# Patient Record
Sex: Female | Born: 1935 | Race: White | Hispanic: No | Smoking: Never smoker
Health system: Southern US, Community
[De-identification: ages and names within clinical notes are randomized; demographics above are authoritative.]

## PROBLEM LIST (undated history)

## (undated) ENCOUNTER — Ambulatory Visit: Admission: EM | Source: Home / Self Care

## (undated) DIAGNOSIS — E7800 Pure hypercholesterolemia, unspecified: Secondary | ICD-10-CM

## (undated) DIAGNOSIS — K219 Gastro-esophageal reflux disease without esophagitis: Secondary | ICD-10-CM

## (undated) DIAGNOSIS — Z9889 Other specified postprocedural states: Secondary | ICD-10-CM

## (undated) DIAGNOSIS — T8859XA Other complications of anesthesia, initial encounter: Secondary | ICD-10-CM

## (undated) DIAGNOSIS — R112 Nausea with vomiting, unspecified: Secondary | ICD-10-CM

## (undated) DIAGNOSIS — M1990 Unspecified osteoarthritis, unspecified site: Secondary | ICD-10-CM

## (undated) HISTORY — PX: TUBAL LIGATION: SHX77

## (undated) HISTORY — PX: CHOLECYSTECTOMY: SHX55

## (undated) HISTORY — PX: OTHER SURGICAL HISTORY: SHX169

## (undated) HISTORY — PX: CATARACT EXTRACTION W/ INTRAOCULAR LENS IMPLANT: SHX1309

---

## 2016-05-07 DIAGNOSIS — E559 Vitamin D deficiency, unspecified: Secondary | ICD-10-CM | POA: Insufficient documentation

## 2016-05-07 DIAGNOSIS — E782 Mixed hyperlipidemia: Secondary | ICD-10-CM | POA: Insufficient documentation

## 2019-04-13 ENCOUNTER — Other Ambulatory Visit: Payer: Self-pay

## 2019-04-13 ENCOUNTER — Ambulatory Visit (INDEPENDENT_AMBULATORY_CARE_PROVIDER_SITE_OTHER): Payer: Medicare Other

## 2019-04-13 ENCOUNTER — Ambulatory Visit (INDEPENDENT_AMBULATORY_CARE_PROVIDER_SITE_OTHER): Payer: Medicare Other | Admitting: Orthopaedic Surgery

## 2019-04-13 ENCOUNTER — Encounter: Payer: Self-pay | Admitting: Orthopaedic Surgery

## 2019-04-13 VITALS — BP 131/66 | HR 78 | Ht 66.0 in | Wt 165.0 lb

## 2019-04-13 DIAGNOSIS — M4807 Spinal stenosis, lumbosacral region: Secondary | ICD-10-CM

## 2019-04-13 DIAGNOSIS — M545 Low back pain, unspecified: Secondary | ICD-10-CM

## 2019-04-13 DIAGNOSIS — M25562 Pain in left knee: Secondary | ICD-10-CM

## 2019-04-13 DIAGNOSIS — G8929 Other chronic pain: Secondary | ICD-10-CM

## 2019-04-13 DIAGNOSIS — M79605 Pain in left leg: Secondary | ICD-10-CM | POA: Insufficient documentation

## 2019-04-13 NOTE — Progress Notes (Signed)
Office Visit Note   Patient: Shelby Chan           Date of Birth: 05/27/1933           MRN: [id number] Visit Date: 04/13/2019              Requested by: No referring provider defined for this encounter. PCP: Donalynn Furlong, MD   Assessment & Plan: Visit Diagnoses:  1. Chronic midline low back pain, unspecified whether sciatica present   2. Chronic pain of left knee   3. Spinal stenosis of lumbosacral region   4. Pain in left leg     Plan: Difficult historian but it appears that the left leg pain may originate from the lumbar spine.  Will obtain MRI scan looking for stenosis.  Having considerable compromise of her activities during the day because of her leg pain that seems somewhat diffuse rather than localized.  We will also complete a form for handicap parking sticker.  Office visit 45 minutes 50% of the time in counseling  Follow-Up Instructions: Return After MRI scan of lumbar spine.   Orders:  Orders Placed This Encounter  Procedures   XR Lumbar Spine 2-3 Views   XR Knee 1-2 Views Left   MR Lumbar Spine w/o contrast   No orders of the defined types were placed in this encounter.     Procedures: No procedures performed   Clinical Data: No additional findings.   Subjective: Chief Complaint  Patient presents with   Right Knee - Pain   Left Knee - Pain   Left Hip - Pain  Patient presents today for bilateral knee pain and left hip pain. No known injury. Patient states that her left side is worse than the right. She has been having pain for 6 months. She states that she has a difficult time getting up or down. She is wanting a handicap application. She does not take anything for pain.  Shelby Chan is accompanied by her daughter and here for evaluation of left leg pain.  She is a difficult historian and with difficulty being specific but she has had trouble for over 6 months.  No history of injury or trauma.  She has had pain predominantly in her  left hip, thigh and left knee.  She does have difficulty with her legs feeling tired and heavy the further she walks to the longer she is on her feet.  She has had considerable trouble sleeping at night trying to find a comfortable position because of her leg pain.  She is tried some over-the-counter medicines but not sure that it is made much of a difference.  Having very minimal discomfort on the right side.  Numbness and tingling is not part of her symptomatology. Over 15 years ago was evaluated at [facility] for low back pain.  Apparently had an MRI scan and possibly back injections.  Has done well over the years without further intervention HPI  Review of Systems   Objective: Vital Signs: BP 131/66    Pulse 78    Ht 5\' 6"  (1.676 m)    Wt 165 lb (74.8 kg)    BMI 26.63 kg/m   Physical Exam Constitutional:      Appearance: She is well-developed.  Eyes:     Pupils: Pupils are equal, round, and reactive to light.  Pulmonary:     Effort: Pulmonary effort is normal.  Skin:    General: Skin is warm and dry.  Neurological:  Mental Status: She is alert and oriented to person, place, and time.  Psychiatric:        Behavior: Behavior normal.     Ortho Exam awake alert and oriented x3.  Comfortable sitting.  Straight leg raise negative bilaterally.  Both feet were warm without swelling.  Minimal venous stasis changes both ankles.  Good pulses.  Motor exam intact.  No left knee effusion.  Mild medial joint tenderness diffusely.  Full extension flexed over 105 degrees without instability.  No localized thigh pain.  No loss of left hip motion with internal and external rotation flexion or extension.  No localized areas of tenderness.  No percussible tenderness of the lumbar spine  Specialty Comments:  No specialty comments available.  Imaging: Xr Knee 1-2 Views Left  Result Date: 04/13/2019 AP lateral standing views of the left knee were obtained.  There is some degenerative change in the medial  compartment with narrowing of the joint space and about 2 to 3 degrees of varus.  No acute changes or ectopic calcification.  Mild degenerative changes about the patellofemoral joint.  Patella tracks in the midline  Xr Lumbar Spine 2-3 Views  Result Date: 04/13/2019 AP lateral lumbar spine were obtained demonstrating slight anterior listhesis of L4 on 5.  There is some degenerative disc disease throughout the lumbar spine with mild narrowing.  No vacuum disc phenomenon.  Facet arthritis predominately at L4-5 and to a lesser extent L5-S1.  Some anterior osteophytes.  Sacroiliac joints intact.  No batwing deformity.  L5 sits low in the pelvis.  Films are consistent with degenerative arthritis without any acute changes    PMFS History: Patient Active Problem List   Diagnosis Date Noted   Pain in left leg 04/13/2019   History reviewed. No pertinent past medical history.  History reviewed. No pertinent family history.  History reviewed. No pertinent surgical history. Social History   Occupational History   Not on file  Tobacco Use   Smoking status: Not on file  Substance and Sexual Activity   Alcohol use: Not on file   Drug use: Not on file   Sexual activity: Not on file

## 2019-04-27 ENCOUNTER — Ambulatory Visit (HOSPITAL_COMMUNITY)
Admission: RE | Admit: 2019-04-27 | Discharge: 2019-04-27 | Disposition: A | Discharge status: Home / Self Care | Payer: Medicare Other | Source: Ambulatory Visit | Attending: Orthopaedic Surgery | Admitting: Orthopaedic Surgery

## 2019-04-27 ENCOUNTER — Other Ambulatory Visit: Payer: Self-pay

## 2019-04-27 DIAGNOSIS — M4807 Spinal stenosis, lumbosacral region: Secondary | ICD-10-CM | POA: Diagnosis not present

## 2019-05-25 ENCOUNTER — Other Ambulatory Visit: Payer: Self-pay | Admitting: Orthopaedic Surgery

## 2019-05-25 DIAGNOSIS — G8929 Other chronic pain: Secondary | ICD-10-CM

## 2019-06-06 ENCOUNTER — Encounter (HOSPITAL_COMMUNITY): Payer: Self-pay

## 2019-06-06 ENCOUNTER — Ambulatory Visit (HOSPITAL_COMMUNITY): Payer: Medicare Other | Attending: Orthopaedic Surgery

## 2019-06-06 ENCOUNTER — Other Ambulatory Visit: Payer: Self-pay

## 2019-06-06 DIAGNOSIS — G8929 Other chronic pain: Secondary | ICD-10-CM | POA: Insufficient documentation

## 2019-06-06 DIAGNOSIS — M6281 Muscle weakness (generalized): Secondary | ICD-10-CM | POA: Insufficient documentation

## 2019-06-06 DIAGNOSIS — M545 Low back pain: Secondary | ICD-10-CM | POA: Diagnosis present

## 2019-06-06 NOTE — Therapy (Signed)
[place], [place], [address] Phone: [phone]   Fax:  [phone]  Physical Therapy Evaluation  Patient Details  Name: Shelby Chan MRN: [id number] Date of Birth: [birth year] Referring Provider (PT): Joni Fears, MD   Encounter Date: 06/06/2019  PT End of Session - 06/06/19 1453    Visit Number  1    Number of Visits  8    Date for PT Re-Evaluation  07/08/19    Authorization Type  Medicare   Secondary: commercial generic   Authorization Time Period  06/06/19 to 07/08/19    Authorization - Visit Number  1    Authorization - Number of Visits  10    PT Start Time  3790    PT Stop Time  1530    PT Time Calculation (min)  54 min    Activity Tolerance  Patient tolerated treatment well    Behavior During Therapy  [facility] for tasks assessed/performed       History reviewed. No pertinent past medical history.  History reviewed. No pertinent surgical history.  There were no vitals filed for this visit.   Subjective Assessment - 06/06/19 1442    Subjective  Pt reports LBP on R side typically, but her L hip, knee and ankle hurt and the RLE does not. Pt reports old fracture to R tibia fixed with pins and no difficulties with currently. Pt reports went to PCP about weird sensation in August "I felt like my bones melted and I couldn't get out of bed", but the weakness went away as she increased daily vitamin intake and walking 3x/week. Pt reports "the L shin bone feels like it goes away" increasing her fear for falling. Pt denies changes in b/b. Pt denies numbness/tingling throughout BLE. Pt denies shooting pains down either leg. Pt denies falls in last year, denies AD use. Pt with increased pain and difficulty with transfers after prolonged sitting. Pt reports being equally limited by back pain and BLE weakness. Pt reports back pain increases most at night after getting up to go to the bathroom, but is able to fall back  asleep so ranks it as "not too bad".    Limitations  Walking;Other (comment)   stairs   How long can you sit comfortably?  no issues    How long can you stand comfortably?  no issues    How long can you walk comfortably?  30 minutes    Diagnostic tests  x-ray    Patient Stated Goals  get my leg strength back    Currently in Pain?  No/denies         [facility] PT Assessment - 06/06/19 0001      Assessment   Medical Diagnosis  LBP    Referring Provider (PT)  Joni Fears, MD    Onset Date/Surgical Date  --   approximately 2 months worsening   Prior Therapy  never for this      Restrictions   Weight Bearing Restrictions  No      Balance Screen   Has the patient fallen in the past 6 months  No    Has the patient had a decrease in activity level because of a fear of falling?   No    Is the patient reluctant to leave their home because of a fear of falling?   No      Cognition   Overall Cognitive Status  Within Functional Limits for tasks assessed  Observation/Other Assessments   Focus on Therapeutic Outcomes (FOTO)   37% limited      Sensation   Light Touch  Appears Intact      Functional Tests   Functional tests  Sit to Stand      Sit to Stand   Comments  5x STS: 16 sec, no UE assist, from chair      Posture/Postural Control   Posture/Postural Control  Postural limitations    Postural Limitations  Rounded Shoulders;Forward head;Increased lumbar lordosis;Decreased thoracic kyphosis      ROM / Strength   AROM / PROM / Strength  Strength;AROM      AROM   AROM Assessment Site  Lumbar    Lumbar Flexion  WNL    Lumbar Extension  WNL, increased pain    Lumbar - Right Side Bend  WNL    Lumbar - Left Side Bend  WNL    Lumbar - Right Rotation  WNL    Lumbar - Left Rotation  WNL      Strength   Strength Assessment Site  Hip;Knee;Ankle    Right/Left Hip  Right;Left    Right Hip Flexion  4+/5    Right Hip Extension  3+/5    Right Hip ABduction  4+/5    Left Hip  Flexion  4+/5    Left Hip Extension  3+/5    Left Hip ABduction  4+/5    Right/Left Knee  Right;Left    Right Knee Flexion  4+/5    Right Knee Extension  5/5    Left Knee Flexion  4+/5    Left Knee Extension  5/5    Right Ankle Dorsiflexion  5/5    Left Ankle Dorsiflexion  5/5      Flexibility   Soft Tissue Assessment /Muscle Length  yes    Hamstrings  --    Quadriceps  Ely's: + bil, increased pain on L    ITB  WNL bil      Palpation   SI assessment   denies pain upon palpation    Palpation comment  unable to recreate pt's pain; denies tenderness throughout lumbar paraspinals bil, piriformis bil      Special Tests    Special Tests  Lumbar    Lumbar Tests  Slump Test      Slump test   Findings  Negative    Comment  bilaterally      Bed Mobility   Bed Mobility  Rolling Right;Rolling Left;Supine to Sit;Sit to Supine   increased pain with rolling and supine<>sit   Rolling Right  --   increased time, increased pain   Rolling Left  --   increased time, increased pain   Supine to Sit  --   increased time, increased pain   Sit to Supine  --   increased time, increased pain     Ambulation/Gait   Gait Pattern  Within Functional Limits    Gait Comments  observed pt ambulate into/out of clinic without deficits noted      Balance   Balance Assessed  Yes      Static Standing Balance   Static Standing - Balance Support  No upper extremity supported    Static Standing Balance -  Activities   Single Leg Stance - Right Leg;Single Leg Stance - Left Leg;Tandam Stance - Right Leg;Tandam Stance - Left Leg    Static Standing - Comment/# of Minutes  SLS: <1 sec bilaterally; Tandem RLE back: 2 sec or <,  Tandem LLE back: 10 sec           Objective measurements completed on examination: See above findings.      PT Education - 06/06/19 1451    Education Details  Assessment findings, POC, FOTO findings, initiated HEP    Person(s) Educated  Patient    Methods  Explanation;Handout     Comprehension  Verbalized understanding       PT Short Term Goals - 06/06/19 1612      PT SHORT TERM GOAL #1   Title  Pt will perform 5x STS in 13 sec or < to indicate improved functional strength and improve ability to move around environment.    Time  2    Period  Weeks    Status  New    Target Date  06/20/19      PT SHORT TERM GOAL #2   Title  Pt will be independent with HEP and perform at least 3x/week along with walking program.    Time  2    Period  Weeks    Status  New        PT Long Term Goals - 06/06/19 1613      PT LONG TERM GOAL #1   Title  Pt will improve BLE strength by  MMT grade to improve gait mechanics, stair negotiation and decrease pain with rolling over in bed.    Time  4    Period  Weeks    Status  New    Target Date  07/04/19      PT LONG TERM GOAL #2   Title  Pt will perform tandem stance for 15 sec bilaterally to reduce risk for falls.    Time  4    Period  Weeks    Status  New      PT LONG TERM GOAL #3   Title  Pt will report 50% decrease in LBP symptoms with functional activities to improve overall QoL.    Time  4    Period  Weeks    Status  New             Plan - 06/06/19 1602    Clinical Impression Statement  Pt is a pleasant 85YO female with chronic LBP. Functionally, pt requires increased time with supine<>sit and rolling on therapy mat reporting increase in LBP with activity. Also, slightly decreased transfer time compared to age related norms of 14.8 sec and pt performed 5x STS in 16 sec. Per MMT, pt with decreased bil hip and hamstring strength. Pt with good lumbar AROM, but slight increase in LBP with lumbar extension. Pt with balance deficits, unable to maintain SLS for >1 sec bilaterally and <15 seconds in tandem stance requiring hand held assist to achieve tandem stance position. Pt would benefit from PT interventions to improve deficits noted and establish HEP to continue progressing independently.    Personal Factors and  Comorbidities  Age    Examination-Activity Limitations  Bed Mobility;Locomotion Level    Examination-Participation Restrictions  Church    Stability/Clinical Decision Making  Stable/Uncomplicated    Clinical Decision Making  Low    Rehab Potential  Good    PT Frequency  2x / week    PT Duration  4 weeks    PT Treatment/Interventions  ADLs/Self Care Home Management;Aquatic Therapy;Biofeedback;Traction;DME Instruction;Gait training;Stair training;Functional mobility training;Therapeutic activities;Therapeutic exercise;Balance training;Neuromuscular re-education;Patient/family education;Orthotic Fit/Training;Manual techniques;Passive range of motion;Dry needling;Taping;Spinal Manipulations;Joint Manipulations    PT Next Visit Plan  Perform DGI.  Review TA set, avoiding breath holding; progress core strengthening in hooklying and eventually progress to standing. Strengthen BLE. Progress to balance training.    PT Home Exercise Plan  Eval: bridge, TA set    Consulted and Agree with Plan of Care  Patient       Patient will benefit from skilled therapeutic intervention in order to improve the following deficits and impairments:  Decreased activity tolerance, Decreased balance, Decreased strength, Difficulty walking, Impaired perceived functional ability, Impaired flexibility, Impaired sensation, Pain  Visit Diagnosis: Chronic bilateral low back pain without sciatica  Muscle weakness (generalized)     Problem List Patient Active Problem List   Diagnosis Date Noted  . Pain in left leg 04/13/2019     Domenick Bookbinder PT, DPT 06/06/19, 4:15 PM [phone]  [facility] [address] [place], [place], [address] Phone: [phone]   Fax:  [phone]  Name: Shelby Chan MRN: [id number] Date of Birth: 25-Sep-1932

## 2019-06-08 ENCOUNTER — Ambulatory Visit (HOSPITAL_COMMUNITY): Payer: Medicare Other | Admitting: Physical Therapy

## 2019-06-08 ENCOUNTER — Other Ambulatory Visit: Payer: Self-pay

## 2019-06-08 DIAGNOSIS — G8929 Other chronic pain: Secondary | ICD-10-CM

## 2019-06-08 DIAGNOSIS — M6281 Muscle weakness (generalized): Secondary | ICD-10-CM

## 2019-06-08 DIAGNOSIS — M545 Low back pain: Secondary | ICD-10-CM | POA: Diagnosis not present

## 2019-06-08 NOTE — Patient Instructions (Signed)
Hamstring Stretch (Sitting)    Sitting, extend one leg and place hands on same thigh for support. Keeping torso straight, lean forward, sliding hands down leg, until a stretch is felt in back of thigh. Hold _30___ seconds. Repeat with other leg. Complete 2-3 times each 1-2X day.   Piriformis Stretch, Sitting    Sit, one ankle on opposite knee, same-side hand on crossed knee. Push down on knee, keeping spine straight. Lean torso forward, with flat back, until tension is felt in hamstrings and gluteals of crossed-leg side. Hold _30__ seconds.  Repeat 2-3 times each leg 1-2X day    Balance: Eyes Open - Unilateral (Varied Surfaces)    Stand on left foot, eyes open. Maintain balance _30___ seconds. Repeat _5___ times per set. Complete __2__times daily   Tandem Stance    Right foot in front of left, heel touching toe both feet "straight ahead". Stand on Foot Triangle of Support with both feet. Balance in this position _30__ seconds. Do with left foot in front of right.   EXTENSION: Standing (Active)    Stand, both feet flat. Draw right leg behind body as far as possible. complete _10__ repetitions. Perform _2__ sessions per day.   Hip Abduction (Standing)    Stand with support. Squeeze pelvic floor and hold. Lift right leg out to side, keeping toe forward. Complete __10__repetitions, __2__times a day   Toe Raise (Standing)    Rock back on heels but don't let your bottom poke out.  Repeat _10_ times per set. Do _2___ sessions per day.    Heel Raises    Stand with support. Tighten pelvic floor and hold. With knees straight, raise heels off ground. Complete __10__reps and complete __2__times a day.

## 2019-06-08 NOTE — Therapy (Addendum)
[place], [place], [address] Phone: [phone]   Fax:  [phone]  Physical Therapy Treatment  Patient Details  Name: Shelby Chan MRN: [id number] Date of Birth: Nov 02, 1932 Referring Provider (PT): Joni Fears, MD   Encounter Date: 06/08/2019  PT End of Session - 06/08/19 1639    Visit Number  2    Number of Visits  8    Date for PT Re-Evaluation  07/08/19    Authorization Type  Medicare   Secondary: commercial generic   Authorization Time Period  06/06/19 to 07/08/19    Authorization - Visit Number  2    Authorization - Number of Visits  10    PT Start Time  1448    PT Stop Time  1550    PT Time Calculation (min)  62 min    Activity Tolerance  Patient tolerated treatment well    Behavior During Therapy  [facility] for tasks assessed/performed       No past medical history on file.  No past surgical history on file.  There were no vitals filed for this visit.                    [facility] PT Treatment/Exercise - 06/08/19 0001      Knee/Hip Exercises: Stretches   Active Hamstring Stretch  Both;2 reps;30 seconds    Active Hamstring Stretch Limitations  longsititng    Piriformis Stretch  Both;2 reps;30 seconds;Limitations    Piriformis Stretch Limitations  seated      Knee/Hip Exercises: Standing   Heel Raises  Both;10 reps    Heel Raises Limitations  toeraises 10 reps    Hip Abduction  Both;10 reps    Hip Extension  Both;10 reps    SLS  5x   both without UE max of 5" each 5 trials   Other Standing Knee Exercises  tandem stance 30" each LE      Knee/Hip Exercises: Seated   Sit to Sand  2 sets;5 reps;without UE support      Knee/Hip Exercises: Supine   Bridges  10 reps    Bridges Limitations  HEP review    Other Supine Knee/Hip Exercises  TA contractions 10X5 with breathing techniuqe HEP rreview             PT Education - 06/08/19 1636    Education Details  reviewed  goals, HEP and POC moving foward.  Updated HEP per patient request with static balance and gentle LE strengthening    Person(s) Educated  Patient    Methods  Explanation;Demonstration;Tactile cues;Verbal cues;Handout    Comprehension  Verbalized understanding;Returned demonstration;Verbal cues required;Tactile cues required;Need further instruction       PT Short Term Goals - 06/08/19 1515      PT SHORT TERM GOAL #1   Title  Pt will perform 5x STS in 13 sec or < to indicate improved functional strength and improve ability to move around environment.    Time  2    Period  Weeks    Status  On-going    Target Date  06/20/19      PT SHORT TERM GOAL #2   Title  Pt will be independent with HEP and perform at least 3x/week along with walking program.    Time  2    Period  Weeks    Status  On-going        PT Long Term Goals - 06/06/19  1613      PT LONG TERM GOAL #1   Title  Pt will improve BLE strength by  MMT grade to improve gait mechanics, stair negotiation and decrease pain with rolling over in bed.    Time  4    Period  Weeks    Status  New    Target Date  07/04/19      PT LONG TERM GOAL #2   Title  Pt will perform tandem stance for 15 sec bilaterally to reduce risk for falls.    Time  4    Period  Weeks    Status  New      PT LONG TERM GOAL #3   Title  Pt will report 50% decrease in LBP symptoms with functional activities to improve overall QoL.    Time  4    Period  Weeks    Status  New            Plan - 06/08/19 1643    Clinical Impression Statement  Reviewed goals, HEP and POC moving foward.  Pt requested more challenging therex today so added static balance (with insturction to hold or complete near counter/wall corner), bilateral LE strengthening and stretching.  Given writen instructions for completion and told to do half in the morning and other half in the evening.  Pt verbalized understanding.  No pain or difficutly with any of the therex, most difficulty     Personal Factors and Comorbidities  Age    Examination-Activity Limitations  Bed Mobility;Locomotion Level    Examination-Participation Restrictions  Church    Stability/Clinical Decision Making  Stable/Uncomplicated    Rehab Potential  Good    PT Frequency  2x / week    PT Duration  4 weeks    PT Treatment/Interventions  ADLs/Self Care Home Management;Aquatic Therapy;Biofeedback;Traction;DME Instruction;Gait training;Stair training;Functional mobility training;Therapeutic activities;Therapeutic exercise;Balance training;Neuromuscular re-education;Patient/family education;Orthotic Fit/Training;Manual techniques;Passive range of motion;Dry needling;Taping;Spinal Manipulations;Joint Manipulations    PT Next Visit Plan  Perform DGI. continue to review TA set to ensure pt is not breath holding.Progress core and Bil LE Strength.    PT Home Exercise Plan  Eval: bridge, TA set   10/6: heelraise, hip abd/ext, static SLS/tandem, sit to stands, Hamstring and piriformis stretches in seated/longseated    Consulted and Agree with Plan of Care  Patient       Patient will benefit from skilled therapeutic intervention in order to improve the following deficits and impairments:  Decreased activity tolerance, Decreased balance, Decreased strength, Difficulty walking, Impaired perceived functional ability, Impaired flexibility, Impaired sensation, Pain  Visit Diagnosis: Chronic bilateral low back pain without sciatica  Muscle weakness (generalized)     Problem List Patient Active Problem List   Diagnosis Date Noted  . Pain in left leg 04/13/2019   Lurena Nida, PTA/CLT [phone]  Lurena Nida 06/08/2019, 5:04 PM  [facility] [address] [place], [place], [address] Phone: [phone]   Fax:  [phone]  Name: Shelby Chan MRN: [id number] Date of Birth: June 08, 1933

## 2019-06-13 ENCOUNTER — Other Ambulatory Visit: Payer: Self-pay

## 2019-06-13 ENCOUNTER — Encounter (HOSPITAL_COMMUNITY): Payer: PRIVATE HEALTH INSURANCE | Admitting: Physical Therapy

## 2019-06-13 ENCOUNTER — Ambulatory Visit (HOSPITAL_COMMUNITY): Payer: Medicare Other | Admitting: Physical Therapy

## 2019-06-13 DIAGNOSIS — M545 Low back pain, unspecified: Secondary | ICD-10-CM

## 2019-06-13 DIAGNOSIS — G8929 Other chronic pain: Secondary | ICD-10-CM

## 2019-06-13 DIAGNOSIS — M6281 Muscle weakness (generalized): Secondary | ICD-10-CM

## 2019-06-13 NOTE — Therapy (Signed)
[place], [place], [address] Phone: [phone]   Fax:  [phone]  Physical Therapy Treatment  Patient Details  Name: Shelby Chan MRN: [id number] Date of Birth: [birth year] Referring Provider (PT): Joni Fears, MD   Encounter Date: 06/13/2019  PT End of Session - 06/13/19 1707    Visit Number  3    Number of Visits  8    Date for PT Re-Evaluation  07/08/19    Authorization Type  Medicare   Secondary: commercial generic   Authorization Time Period  06/06/19 to 07/08/19    Authorization - Visit Number  3    Authorization - Number of Visits  10    PT Start Time  1616    PT Stop Time  1700    PT Time Calculation (min)  44 min    Activity Tolerance  Patient tolerated treatment well    Behavior During Therapy  [facility] for tasks assessed/performed       No past medical history on file.  No past surgical history on file.  There were no vitals filed for this visit.  Subjective Assessment - 06/13/19 1616    Subjective  pt states the exercises are going well at home.  Having no issues today but her RT back/buttock gave her a fit last night and hurt this morning too.    Currently in Pain?  No/denies         [facility] PT Assessment - 06/13/19 0001      Assessment   Medical Diagnosis  LBP    Referring Provider (PT)  Joni Fears, MD      Dynamic Gait Index   Level Surface  Normal    Change in Gait Speed  Mild Impairment    Gait with Horizontal Head Turns  Mild Impairment    Gait with Vertical Head Turns  Mild Impairment    Gait and Pivot Turn  Mild Impairment    Step Over Obstacle  Mild Impairment    Step Around Obstacles  Mild Impairment    Steps  Mild Impairment    Total Score  17                   [facility] Adult PT Treatment/Exercise - 06/13/19 0001      Knee/Hip Exercises: Stretches   Active Hamstring Stretch  Both;2 reps;30 seconds    Active Hamstring Stretch Limitations   longsititng    Piriformis Stretch  Both;2 reps;30 seconds;Limitations    Piriformis Stretch Limitations  seated      Knee/Hip Exercises: Standing   Heel Raises  Both;10 reps    Heel Raises Limitations  toeraises 10 reps    Hip Abduction  Both;10 reps    Hip Extension  Both;10 reps    SLS  5x    Other Standing Knee Exercises  tandem stance 30" each LE      Knee/Hip Exercises: Seated   Sit to Sand  10 reps      Knee/Hip Exercises: Supine   Bridges  10 reps    Straight Leg Raises  Both;10 reps      Knee/Hip Exercises: Sidelying   Hip ABduction  Both;10 reps      Knee/Hip Exercises: Prone   Hamstring Curl  10 reps               PT Short Term Goals - 06/08/19 1515      PT SHORT TERM GOAL #1  Title  Pt will perform 5x STS in 13 sec or < to indicate improved functional strength and improve ability to move around environment.    Time  2    Period  Weeks    Status  On-going    Target Date  06/20/19      PT SHORT TERM GOAL #2   Title  Pt will be independent with HEP and perform at least 3x/week along with walking program.    Time  2    Period  Weeks    Status  On-going        PT Long Term Goals - 06/06/19 1613      PT LONG TERM GOAL #1   Title  Pt will improve BLE strength by  MMT grade to improve gait mechanics, stair negotiation and decrease pain with rolling over in bed.    Time  4    Period  Weeks    Status  New    Target Date  07/04/19      PT LONG TERM GOAL #2   Title  Pt will perform tandem stance for 15 sec bilaterally to reduce risk for falls.    Time  4    Period  Weeks    Status  New      PT LONG TERM GOAL #3   Title  Pt will report 50% decrease in LBP symptoms with functional activities to improve overall QoL.    Time  4    Period  Weeks    Status  New            Plan - 06/13/19 1645    Clinical Impression Statement  pt with poor recall of therex requiring verbal, tactile cues and demonstration by therapist. DGI completed with  score of 17 today. Added additonal LE strengthening exercises in supine, sidelying and prone without issues and good form.  No new exercises given this session as patient has not mastered form of ones given.    Personal Factors and Comorbidities  Age    Examination-Activity Limitations  Bed Mobility;Locomotion Level    Examination-Participation Restrictions  Church    Stability/Clinical Decision Making  Stable/Uncomplicated    Rehab Potential  Good    PT Frequency  2x / week    PT Duration  4 weeks    PT Treatment/Interventions  ADLs/Self Care Home Management;Aquatic Therapy;Biofeedback;Traction;DME Instruction;Gait training;Stair training;Functional mobility training;Therapeutic activities;Therapeutic exercise;Balance training;Neuromuscular re-education;Patient/family education;Orthotic Fit/Training;Manual techniques;Passive range of motion;Dry needling;Taping;Spinal Manipulations;Joint Manipulations    PT Next Visit Plan  Continue with Progression of core and Bil LE Strength.    PT Home Exercise Plan  Eval: bridge, TA set   10/6: heelraise, hip abd/ext, static SLS/tandem, sit to stands, Hamstring and piriformis stretches in seated/longseated    Consulted and Agree with Plan of Care  Patient       Patient will benefit from skilled therapeutic intervention in order to improve the following deficits and impairments:  Decreased activity tolerance, Decreased balance, Decreased strength, Difficulty walking, Impaired perceived functional ability, Impaired flexibility, Impaired sensation, Pain  Visit Diagnosis: Chronic bilateral low back pain without sciatica  Muscle weakness (generalized)     Problem List Patient Active Problem List   Diagnosis Date Noted  . Pain in left leg 04/13/2019   Lurena Nida, PTA/CLT [phone]  Lurena Nida 06/13/2019, 5:08 PM  [facility] [address] [place], [place], [address] Phone: [phone]   Fax:   [phone]  Name:  Shelby Chan MRN: [id number] Date of Birth: 03/03/1933

## 2019-06-15 ENCOUNTER — Ambulatory Visit (HOSPITAL_COMMUNITY): Payer: Medicare Other | Admitting: Physical Therapy

## 2019-06-15 ENCOUNTER — Other Ambulatory Visit: Payer: Self-pay

## 2019-06-15 DIAGNOSIS — M545 Low back pain, unspecified: Secondary | ICD-10-CM

## 2019-06-15 DIAGNOSIS — M6281 Muscle weakness (generalized): Secondary | ICD-10-CM

## 2019-06-15 DIAGNOSIS — G8929 Other chronic pain: Secondary | ICD-10-CM

## 2019-06-15 NOTE — Therapy (Signed)
[place], [place], [address] Phone: [phone]   Fax:  [phone]  Physical Therapy Treatment  Patient Details  Name: Shelby Chan MRN: [id number] Date of Birth: Nov 30, 1932 Referring Provider (PT): Joni Fears, MD   Encounter Date: 06/15/2019  PT End of Session - 06/15/19 1733    Visit Number  4    Number of Visits  8    Date for PT Re-Evaluation  07/08/19    Authorization Type  Medicare   Secondary: commercial generic   Authorization Time Period  06/06/19 to 07/08/19    Authorization - Visit Number  4    Authorization - Number of Visits  10    PT Start Time  1620    PT Stop Time  1706    PT Time Calculation (min)  46 min    Activity Tolerance  Patient tolerated treatment well    Behavior During Therapy  [facility] for tasks assessed/performed       No past medical history on file.  No past surgical history on file.  There were no vitals filed for this visit.  Subjective Assessment - 06/15/19 1624    Subjective  pt states she is doing well today, reports she is having some pain behind her Rt knee today 1/10 with unknown cause.  No pain anywhere else.    Currently in Pain?  No/denies                       [facility] Adult PT Treatment/Exercise - 06/15/19 0001      Knee/Hip Exercises: Stretches   Active Hamstring Stretch  Both;2 reps;30 seconds    Active Hamstring Stretch Limitations  longsititng    Piriformis Stretch  Both;2 reps;30 seconds;Limitations    Piriformis Stretch Limitations  seated      Knee/Hip Exercises: Standing   Heel Raises  15 reps    Heel Raises Limitations  toeraises 15 reps    Hip Abduction  Both;15 reps    Stairs  5 RT 6" step no UE assist    SLS  5x   max of 5 Rt: 15", Lt: 8"   Other Standing Knee Exercises  tandem stance 10 reps UE flexion with 2# bar      Knee/Hip Exercises: Seated   Sit to Sand  10 reps      Knee/Hip Exercises: Supine   Bridges  10  reps      Knee/Hip Exercises: Sidelying   Hip ABduction  Both;10 reps      Knee/Hip Exercises: Prone   Hamstring Curl  10 reps               PT Short Term Goals - 06/08/19 1515      PT SHORT TERM GOAL #1   Title  Pt will perform 5x STS in 13 sec or < to indicate improved functional strength and improve ability to move around environment.    Time  2    Period  Weeks    Status  On-going    Target Date  06/20/19      PT SHORT TERM GOAL #2   Title  Pt will be independent with HEP and perform at least 3x/week along with walking program.    Time  2    Period  Weeks    Status  On-going        PT Long Term Goals - 06/06/19 1749  PT LONG TERM GOAL #1   Title  Pt will improve BLE strength by  MMT grade to improve gait mechanics, stair negotiation and decrease pain with rolling over in bed.    Time  4    Period  Weeks    Status  New    Target Date  07/04/19      PT LONG TERM GOAL #2   Title  Pt will perform tandem stance for 15 sec bilaterally to reduce risk for falls.    Time  4    Period  Weeks    Status  New      PT LONG TERM GOAL #3   Title  Pt will report 50% decrease in LBP symptoms with functional activities to improve overall QoL.    Time  4    Period  Weeks    Status  New            Plan - 06/15/19 1733    Clinical Impression Statement  Continued review of HEP with corrections for form and cues for recall.  Added vectors today wot continue to work on LE stability.  pt with increased hold times today with SLS.  Also added steps as pateint has approx 15 steps to negotiate to basement for laundry. Pt able to complete 6" steps reciprocally without use of HR, however with some instability noted.  Pt albe to remain balanced without assist needed from therapist.  No new exercises added to HEP today.    Personal Factors and Comorbidities  Age    Examination-Activity Limitations  Bed Mobility;Locomotion Level    Examination-Participation Restrictions   Church    Stability/Clinical Decision Making  Stable/Uncomplicated    Rehab Potential  Good    PT Frequency  2x / week    PT Duration  4 weeks    PT Treatment/Interventions  ADLs/Self Care Home Management;Aquatic Therapy;Biofeedback;Traction;DME Instruction;Gait training;Stair training;Functional mobility training;Therapeutic activities;Therapeutic exercise;Balance training;Neuromuscular re-education;Patient/family education;Orthotic Fit/Training;Manual techniques;Passive range of motion;Dry needling;Taping;Spinal Manipulations;Joint Manipulations    PT Next Visit Plan  Continue with Progression of core and Bil LE Strength.    PT Home Exercise Plan  Eval: bridge, TA set   10/6: heelraise, hip abd/ext, static SLS/tandem, sit to stands, Hamstring and piriformis stretches in seated/longseated    Consulted and Agree with Plan of Care  Patient       Patient will benefit from skilled therapeutic intervention in order to improve the following deficits and impairments:  Decreased activity tolerance, Decreased balance, Decreased strength, Difficulty walking, Impaired perceived functional ability, Impaired flexibility, Impaired sensation, Pain  Visit Diagnosis: Chronic bilateral low back pain without sciatica  Muscle weakness (generalized)     Problem List Patient Active Problem List   Diagnosis Date Noted  . Pain in left leg 04/13/2019   Shelby Chan, PTA/CLT [phone] Shelby Chan 06/15/2019, 5:38 PM  [facility] [address] [place], [place], [address] Phone: [phone]   Fax:  [phone]  Name: Shelby Chan MRN: [id number] Date of Birth: 1933/04/14

## 2019-06-20 ENCOUNTER — Ambulatory Visit (HOSPITAL_COMMUNITY): Payer: Medicare Other | Admitting: Physical Therapy

## 2019-06-20 ENCOUNTER — Telehealth (HOSPITAL_COMMUNITY): Payer: Self-pay | Admitting: Physical Therapy

## 2019-06-20 NOTE — Telephone Encounter (Signed)
provider out of the office due to an emergency. pt already had an appt in [place] this am and could not come in any earlier. S/w the pt's dtr suzanne.

## 2019-06-21 ENCOUNTER — Encounter (HOSPITAL_COMMUNITY): Payer: Self-pay

## 2019-06-21 ENCOUNTER — Other Ambulatory Visit: Payer: Self-pay

## 2019-06-21 ENCOUNTER — Ambulatory Visit (HOSPITAL_COMMUNITY): Payer: Medicare Other

## 2019-06-21 DIAGNOSIS — G8929 Other chronic pain: Secondary | ICD-10-CM

## 2019-06-21 DIAGNOSIS — M545 Low back pain: Secondary | ICD-10-CM

## 2019-06-21 DIAGNOSIS — M6281 Muscle weakness (generalized): Secondary | ICD-10-CM

## 2019-06-21 NOTE — Therapy (Signed)
[facility] [address] [place], [place], [address] Phone: [phone]   Fax:  [phone]  Physical Therapy Treatment  Patient Details  Name: Shelby Chan MRN: [id number] Date of Birth: 10/09/1932 Referring Provider (PT): Norlene CampbellPeter Whitfield, MD   Encounter Date: 06/21/2019  PT End of Session - 06/21/19 1202    Visit Number  5    Number of Visits  8    Date for PT Re-Evaluation  07/08/19    Authorization Type  Medicare; Secondary: commercial generic    Authorization Time Period  06/06/19 to 07/08/19    Authorization - Visit Number  5    Authorization - Number of Visits  10    PT Start Time  1136    PT Stop Time  1218    PT Time Calculation (min)  42 min    Activity Tolerance  Patient tolerated treatment well    Behavior During Therapy  [facility] for tasks assessed/performed       History reviewed. No pertinent past medical history.  History reviewed. No pertinent surgical history.  There were no vitals filed for this visit.  Subjective Assessment - 06/21/19 1140    Subjective  Pt stated she is doing well today, no reports of LBP, does c/o Lt knee pain on anterior/medial aspect 5/10 while walking and sitting.  Reports she was able to sleep good last night, no reports of pain or waking due to pain.    Currently in Pain?  No/denies    Multiple Pain Sites  Yes    Pain Score  5    Pain Location  Knee    Pain Orientation  Left    Pain Descriptors / Indicators  --   it just hurts   Pain Type  Chronic pain    Pain Onset  More than a month ago    Pain Frequency  Intermittent    Aggravating Factors   sitting and walking, fatigue    Pain Relieving Factors  time    Effect of Pain on Daily Activities  Makes it through with minimal effects on daily activities.                       [facility] Adult PT Treatment/Exercise - 06/21/19 0001      Knee/Hip Exercises: Standing   Heel Raises  15 reps    Heel Raises Limitations   toeraises 15 reps incline slope    Hip Abduction  Both;15 reps    Functional Squat  10 reps    Functional Squat Limitations  front of chair, cueing for mechanics    Stairs  5 RT 7" step no UE assist    SLS  5x   Lt 5", Rt 4" max   SLS with Vectors  3x 5" with Bil HHA    Other Standing Knee Exercises  tandem stance 10 reps UE flexion with 2# bar    Other Standing Knee Exercises  sidestep RTB 2RT      Knee/Hip Exercises: Seated   Sit to Sand  10 reps      Knee/Hip Exercises: Prone   Hamstring Curl  10 reps    Hip Extension  10 reps               PT Short Term Goals - 06/08/19 1515      PT SHORT TERM GOAL #1   Title  Pt will perform 5x STS in 13 sec or < to  indicate improved functional strength and improve ability to move around environment.    Time  2    Period  Weeks    Status  On-going    Target Date  06/20/19      PT SHORT TERM GOAL #2   Title  Pt will be independent with HEP and perform at least 3x/week along with walking program.    Time  2    Period  Weeks    Status  On-going        PT Long Term Goals - 06/06/19 1613      PT LONG TERM GOAL #1   Title  Pt will improve BLE strength by  MMT grade to improve gait mechanics, stair negotiation and decrease pain with rolling over in bed.    Time  4    Period  Weeks    Status  New    Target Date  07/04/19      PT LONG TERM GOAL #2   Title  Pt will perform tandem stance for 15 sec bilaterally to reduce risk for falls.    Time  4    Period  Weeks    Status  New      PT LONG TERM GOAL #3   Title  Pt will report 50% decrease in LBP symptoms with functional activities to improve overall QoL.    Time  4    Period  Weeks    Status  New            Plan - 06/21/19 1259    Clinical Impression Statement  Pt able to recall HEP form and reports compliance with HEP.  Progressed to standing exercises this session with focus on LE strengthening and balance training.  Pt able to complete reciprocal pattern on  7in stairs without handrails, noted some instability descending and encouraged to use handrails for safety at home to reduce risk of fall.  Added vector stance, sidestep with theraband resistance and squats for gluteal strengthening to assist with functional activiites and balance.    Personal Factors and Comorbidities  Age    Examination-Activity Limitations  Bed Mobility;Locomotion Level    Examination-Participation Restrictions  Church    Stability/Clinical Decision Making  Stable/Uncomplicated    Clinical Decision Making  Low    Rehab Potential  Good    PT Frequency  2x / week    PT Duration  4 weeks    PT Treatment/Interventions  ADLs/Self Care Home Management;Aquatic Therapy;Biofeedback;Traction;DME Instruction;Gait training;Stair training;Functional mobility training;Therapeutic activities;Therapeutic exercise;Balance training;Neuromuscular re-education;Patient/family education;Orthotic Fit/Training;Manual techniques;Passive range of motion;Dry needling;Taping;Spinal Manipulations;Joint Manipulations    PT Next Visit Plan  Continue with Progression of core and Bil LE Strength.    PT Home Exercise Plan  Eval: bridge, TA set   10/6: heelraise, hip abd/ext, static SLS/tandem, sit to stands, Hamstring and piriformis stretches in seated/longseated.  10/20: prone hip extension       Patient will benefit from skilled therapeutic intervention in order to improve the following deficits and impairments:  Decreased activity tolerance, Decreased balance, Decreased strength, Difficulty walking, Impaired perceived functional ability, Impaired flexibility, Impaired sensation, Pain  Visit Diagnosis: Muscle weakness (generalized)  Chronic bilateral low back pain without sciatica     Problem List Patient Active Problem List   Diagnosis Date Noted  . Pain in left leg 04/13/2019   Becky Sax, LPTA; CBIS [phone]  Juel Burrow 06/21/2019, 1:08 PM  [facility] [address]  [place], [place], [address] Phone: [phone]   Fax:  [phone]  Name: Shelby Chan MRN: [id number] Date of Birth: [birth year]

## 2019-06-21 NOTE — Patient Instructions (Signed)
Prone Single Leg Raise    Lie on stomach, forehead on hands. Exhale, raising one leg, front of hip on the mat. Inhale, lowering leg. Repeat 10 times, alternating legs. Do 1-2 sessions per day.  http://pm.exer.us/41   Copyright  VHI. All rights reserved.

## 2019-06-23 ENCOUNTER — Ambulatory Visit (HOSPITAL_COMMUNITY): Payer: Medicare Other | Admitting: Physical Therapy

## 2019-06-23 ENCOUNTER — Other Ambulatory Visit: Payer: Self-pay

## 2019-06-23 ENCOUNTER — Encounter (HOSPITAL_COMMUNITY): Payer: Self-pay | Admitting: Physical Therapy

## 2019-06-23 DIAGNOSIS — M545 Low back pain, unspecified: Secondary | ICD-10-CM

## 2019-06-23 DIAGNOSIS — M6281 Muscle weakness (generalized): Secondary | ICD-10-CM

## 2019-06-23 DIAGNOSIS — G8929 Other chronic pain: Secondary | ICD-10-CM

## 2019-06-23 NOTE — Therapy (Signed)
[place], [place], [address] Phone: [phone]   Fax:  [phone]  Physical Therapy Treatment  Patient Details  Name: Marge Vandermeulen Petrucci MRN: [id number] Date of Birth: Aug 25, 1933 Referring Provider (PT): Joni Fears, MD   Encounter Date: 06/23/2019  PT End of Session - 06/23/19 1323    Visit Number  6    Number of Visits  8    Date for PT Re-Evaluation  07/08/19    Authorization Type  Medicare; Secondary: commercial generic    Authorization Time Period  06/06/19 to 07/08/19    Authorization - Visit Number  6    Authorization - Number of Visits  10    PT Start Time  1320    Activity Tolerance  Patient tolerated treatment well    Behavior During Therapy  [facility] for tasks assessed/performed       History reviewed. No pertinent past medical history.  History reviewed. No pertinent surgical history.  There were no vitals filed for this visit.  Subjective Assessment - 06/23/19 1346    Subjective  Staes she can tell she is getting better. Her back doesn't hurt as much her legs get stronger. States she was so happy she was able to go up and down the steps here last time. States she did have some pain in her legs this morning but it eventually went away. States overall she feels about 50% better.    Currently in Pain?  No/denies    Pain Score  0-No pain    Pain Radiating Towards  not radiating in legs today    Pain Onset  More than a month ago         [facility] PT Assessment - 06/23/19 0001      Assessment   Medical Diagnosis  LBP    Referring Provider (PT)  Joni Fears, MD      Observation/Other Assessments   Focus on Therapeutic Outcomes (FOTO)   25% limited   37% limited at eval     Transfers   Five time sit to stand comments   11.6 seconds      Static Standing Balance   Static Standing Balance -  Activities   Tandam Stance - Right Leg;Tandam Stance - Left Leg    Static Standing - Comment/# of  Minutes  R posterior in tandem 30", L posterior in tandem 24"                   [facility] Adult PT Treatment/Exercise - 06/23/19 0001      Knee/Hip Exercises: Standing   Heel Raises  10 reps;Both;5 seconds;1 set    Forward Step Up  4 sets;5 reps;Hand Hold: 0;Step Height: 8"   and step back  B   Other Standing Knee Exercises  tandem stance - bilaterall 3x30" on floor - focus on single point               PT Short Term Goals - 06/23/19 1323      PT SHORT TERM GOAL #1   Title  Pt will perform 5x STS in 13 sec or < to indicate improved functional strength and improve ability to move around environment.    Baseline  06/23/19 11.6 seconds    Time  2    Period  Weeks    Status  Achieved    Target Date  06/20/19      PT SHORT TERM GOAL #2   Title  Pt  will be independent with HEP and perform at least 3x/week along with walking program.    Baseline  06/23/19 does exercises everyday    Time  2    Period  Weeks    Status  Achieved        PT Long Term Goals - 06/23/19 1324      PT LONG TERM GOAL #1   Title  Pt will improve BLE strength by  MMT grade to improve gait mechanics, stair negotiation and decrease pain with rolling over in bed.    Time  4    Period  Weeks    Status  On-going      PT LONG TERM GOAL #2   Title  Pt will perform tandem stance for 15 sec bilaterally to reduce risk for falls.    Baseline  06/23/19 right leg posterior 30 seconds, left leg posterior 24 sec    Time  4    Period  Weeks    Status  Achieved      PT LONG TERM GOAL #3   Title  Pt will report 50% decrease in LBP symptoms with functional activities to improve overall QoL.    Baseline  06/23/19 50% better    Time  4    Period  Weeks    Status  Achieved      PT LONG TERM GOAL #4   Title  Patient will be able to report walking with her neighbors outside with minimal diffifulcty to get back to PLOF.    Time  4    Period  Weeks    Status  New            Plan - 06/23/19 1323     Clinical Impression Statement  Focus of today's session was reviewing goals, updating FOTO (improved in 12% limitation since start of PT) and objective measurements. Patient is making great progress and has met 2/2 short term goals and 2/3 long term goals. She continues to have pain but it is much better then it was. Patient would continue to benefit from skilled physical therapy focusing on functional strength and endurance. Patient would like to get back to walking program with her neighbors. Added new goal to work towards this.    Personal Factors and Comorbidities  Age    Examination-Activity Limitations  Bed Mobility;Locomotion Level    Examination-Participation Restrictions  Church    Stability/Clinical Decision Making  Stable/Uncomplicated    Rehab Potential  Good    PT Frequency  2x / week    PT Duration  4 weeks    PT Treatment/Interventions  ADLs/Self Care Home Management;Aquatic Therapy;Biofeedback;Traction;DME Instruction;Gait training;Stair training;Functional mobility training;Therapeutic activities;Therapeutic exercise;Balance training;Neuromuscular re-education;Patient/family education;Orthotic Fit/Training;Manual techniques;Passive range of motion;Dry needling;Taping;Spinal Manipulations;Joint Manipulations    PT Next Visit Plan  Continue with Progression of core and Bil LE Strength.    PT Home Exercise Plan  Eval: bridge, TA set   10/6: heelraise, hip abd/ext, static SLS/tandem, sit to stands, Hamstring and piriformis stretches in seated/longseated.  10/20: prone hip extension       Patient will benefit from skilled therapeutic intervention in order to improve the following deficits and impairments:  Decreased activity tolerance, Decreased balance, Decreased strength, Difficulty walking, Impaired perceived functional ability, Impaired flexibility, Impaired sensation, Pain  Visit Diagnosis: Muscle weakness (generalized)  Chronic bilateral low back pain without  sciatica     Problem List Patient Active Problem List   Diagnosis Date Noted  . Pain in left leg 04/13/2019  2:01 PM, 06/23/19 Jerene Pitch, DPT Physical Therapy with [facility]  [phone] office  [place], [place], [address] Phone: [phone]   Fax:  [phone]  Name: Tanysha Quant [place] MRN: [id number] Date of Birth: 05-02-1933

## 2019-06-24 ENCOUNTER — Encounter (HOSPITAL_COMMUNITY): Payer: PRIVATE HEALTH INSURANCE | Admitting: Physical Therapy

## 2019-06-27 ENCOUNTER — Ambulatory Visit (HOSPITAL_COMMUNITY): Payer: Medicare Other

## 2019-06-27 ENCOUNTER — Telehealth (HOSPITAL_COMMUNITY): Payer: Self-pay

## 2019-06-27 NOTE — Telephone Encounter (Signed)
pt's dtr wants to cancel both appts due to they are going out of town unexpectingly

## 2019-06-29 ENCOUNTER — Encounter (HOSPITAL_COMMUNITY): Payer: PRIVATE HEALTH INSURANCE

## 2019-07-04 ENCOUNTER — Encounter (HOSPITAL_COMMUNITY): Payer: Self-pay

## 2019-07-04 ENCOUNTER — Ambulatory Visit (HOSPITAL_COMMUNITY): Payer: Medicare Other | Attending: Orthopaedic Surgery

## 2019-07-04 ENCOUNTER — Other Ambulatory Visit: Payer: Self-pay

## 2019-07-04 DIAGNOSIS — M6281 Muscle weakness (generalized): Secondary | ICD-10-CM

## 2019-07-04 DIAGNOSIS — M545 Low back pain: Secondary | ICD-10-CM | POA: Diagnosis not present

## 2019-07-04 DIAGNOSIS — G8929 Other chronic pain: Secondary | ICD-10-CM

## 2019-07-04 NOTE — Therapy (Signed)
[facility] [address] [place], [place], [address] Phone: [phone]   Fax:  [phone]  Physical Therapy Treatment  Patient Details  Name: Shelby Chan MRN: [id number] Date of Birth: 02-Jul-1933 Referring Provider (PT): Norlene Campbell, MD   Encounter Date: 07/04/2019  PT End of Session - 07/04/19 1351    Visit Number  7    Number of Visits  8    Date for PT Re-Evaluation  07/08/19    Authorization Type  Medicare; Secondary: commercial generic    Authorization Time Period  06/06/19 to 07/08/19    Authorization - Visit Number  7    Authorization - Number of Visits  10    PT Start Time  1351    PT Stop Time  1429    PT Time Calculation (min)  38 min    Activity Tolerance  Patient tolerated treatment well    Behavior During Therapy  [facility] for tasks assessed/performed       History reviewed. No pertinent past medical history.  History reviewed. No pertinent surgical history.  There were no vitals filed for this visit.  Subjective Assessment - 07/04/19 1354    Subjective  Pt reports going out of town to sell her mother's house. Pt reports "life is much better" since starting therapy regarding her low back pain.    Limitations  Walking;Other (comment)    How long can you sit comfortably?  no issues    How long can you stand comfortably?  no issues    How long can you walk comfortably?  30 minutes    Diagnostic tests  x-ray    Patient Stated Goals  get my leg strength back    Currently in Pain?  No/denies    Pain Onset  More than a month ago              [facility] Adult PT Treatment/Exercise - 07/04/19 0001      Knee/Hip Exercises: Stretches   Active Hamstring Stretch  Both;2 reps;30 seconds    Active Hamstring Stretch Limitations  12" step    Piriformis Stretch  Both;2 reps;30 seconds    Piriformis Stretch Limitations  seated      Knee/Hip Exercises: Standing   Heel Raises  15 reps    Heel Raises Limitations   incline    Forward Lunges  Both;10 reps    Forward Lunges Limitations  // bars for balance support    Hip Abduction  Both;15 reps    Abduction Limitations  cues for straight knee    Hip Extension  Both;15 reps    Lateral Step Up  Both;10 reps;Step Height: 8"    Forward Step Up  Both;10 reps;Step Height: 8"    Other Standing Knee Exercises  tandem stance, 6x10 sec hold each leg back             PT Education - 07/04/19 1357    Education Details  Exercise technique, udpated HEP, d/c next session    Person(s) Educated  Patient    Methods  Explanation;Demonstration;Handout    Comprehension  Verbalized understanding       PT Short Term Goals - 06/23/19 1323      PT SHORT TERM GOAL #1   Title  Pt will perform 5x STS in 13 sec or < to indicate improved functional strength and improve ability to move around environment.    Baseline  06/23/19 11.6 seconds    Time  2  Period  Weeks    Status  Achieved    Target Date  06/20/19      PT SHORT TERM GOAL #2   Title  Pt will be independent with HEP and perform at least 3x/week along with walking program.    Baseline  06/23/19 does exercises everyday    Time  2    Period  Weeks    Status  Achieved        PT Long Term Goals - 06/23/19 1324      PT LONG TERM GOAL #1   Title  Pt will improve BLE strength by  MMT grade to improve gait mechanics, stair negotiation and decrease pain with rolling over in bed.    Time  4    Period  Weeks    Status  On-going      PT LONG TERM GOAL #2   Title  Pt will perform tandem stance for 15 sec bilaterally to reduce risk for falls.    Baseline  06/23/19 right leg posterior 30 seconds, left leg posterior 24 sec    Time  4    Period  Weeks    Status  Achieved      PT LONG TERM GOAL #3   Title  Pt will report 50% decrease in LBP symptoms with functional activities to improve overall QoL.    Baseline  06/23/19 50% better    Time  4    Period  Weeks    Status  Achieved      PT LONG TERM  GOAL #4   Title  Patient will be able to report walking with her neighbors outside with minimal diffifulcty to get back to PLOF.    Time  4    Period  Weeks    Status  New            Plan - 07/04/19 1430    Clinical Impression Statement  Pt able to increase step height and reps this session without pain. Pt requires intermittent verbal cues for technique and fatigues with reps. Cues for core engagement with standing exercises to prevent lumbar extension and low back pain with exercises. Added forward lunges for balance and strengthening this date, pt requires single UE support on parallel bars for balancing. Updated pt's HEP this date and discussed d/c due to progress made thus far with therapy. Plan to reassess next session and d/c pt with HEP to continue strengthening at home.    Personal Factors and Comorbidities  Age    Examination-Activity Limitations  Bed Mobility;Locomotion Level    Examination-Participation Restrictions  [employer]    Stability/Clinical Decision Making  Stable/Uncomplicated    Rehab Potential  Good    PT Frequency  2x / week    PT Duration  4 weeks    PT Treatment/Interventions  ADLs/Self Care Home Management;Aquatic Therapy;Biofeedback;Traction;DME Instruction;Gait training;Stair training;Functional mobility training;Therapeutic activities;Therapeutic exercise;Balance training;Neuromuscular re-education;Patient/family education;Orthotic Fit/Training;Manual techniques;Passive range of motion;Dry needling;Taping;Spinal Manipulations;Joint Manipulations    PT Next Visit Plan  finalize HEP and discharge    PT Home Exercise Plan  Eval: bridge, TA set   10/6: heelraise, hip abd/ext, static SLS/tandem, sit to stands, Hamstring and piriformis stretches in seated/longseated.  10/20: prone hip extension; 11/2: standing hip abd and ext    Consulted and Agree with Plan of Care  Patient       Patient will benefit from skilled therapeutic intervention in order to improve the  following deficits and impairments:  Decreased activity tolerance, Decreased  balance, Decreased strength, Difficulty walking, Impaired perceived functional ability, Impaired flexibility, Impaired sensation, Pain  Visit Diagnosis: Chronic bilateral low back pain without sciatica  Muscle weakness (generalized)     Problem List Patient Active Problem List   Diagnosis Date Noted  . Pain in left leg 04/13/2019    Domenick Bookbinderori Wilmer Santillo PT, DPT 07/04/19, 2:32 PM [phone]  [facility] [address] [place], [place], [address] Phone: [phone]   Fax:  [phone]  Name: Shelby Chan MRN: [id number] Date of Birth: 06/08/1933

## 2019-07-06 ENCOUNTER — Telehealth (HOSPITAL_COMMUNITY): Payer: Self-pay

## 2019-07-06 ENCOUNTER — Ambulatory Visit (HOSPITAL_COMMUNITY): Payer: Medicare Other

## 2019-07-06 NOTE — Telephone Encounter (Signed)
pt called to cancel today's appt due to car trouble.

## 2021-06-12 ENCOUNTER — Ambulatory Visit: Payer: Self-pay

## 2021-06-12 ENCOUNTER — Ambulatory Visit (INDEPENDENT_AMBULATORY_CARE_PROVIDER_SITE_OTHER): Payer: Medicare Other | Admitting: Orthopaedic Surgery

## 2021-06-12 ENCOUNTER — Other Ambulatory Visit: Payer: Self-pay

## 2021-06-12 ENCOUNTER — Encounter: Payer: Self-pay | Admitting: Orthopaedic Surgery

## 2021-06-12 DIAGNOSIS — M25572 Pain in left ankle and joints of left foot: Secondary | ICD-10-CM | POA: Diagnosis not present

## 2021-06-12 NOTE — Progress Notes (Signed)
Office Visit Note   Patient: Shelby Chan           Date of Birth: August 14, 1933           MRN: [id number] Visit Date: 06/12/2021              Requested by: Dorice Lamas, MD [address] [place],  [place] [address] PCP: Dorice Lamas, MD   Assessment & Plan: Visit Diagnoses:  1. Pain in left ankle and joints of left foot     Plan: [name] has been experiencing left ankle and foot pain for at least a month.  She lives by her self and is quite independent and notes that she has not experienced a specific injury or trauma.  She has most of her discomfort when she first gets up in the morning or when she first gets up from a sitting position.  She has some pain along the lateral aspect of her foot, the midfoot and even the plantar aspect of her foot.  X-rays were nondiagnostic other than some degenerative changes in the midfoot which certainly could explain some of her symptoms.  I think she has had a mild sprain of the anterior talofibular ligament and probably has some element of plantar fasciitis.  I like her to use Voltaren gel as needed and purchase a pullover ankle support at one of the pharmacies.  She is wearing good comfortable shoes with a good arch support.  All questions were answered.  We will plan to see her back as needed  Follow-Up Instructions: Return if symptoms worsen or fail to improve.   Orders:  Orders Placed This Encounter  Procedures   XR Ankle Complete Left   No orders of the defined types were placed in this encounter.     Procedures: No procedures performed   Clinical Data: No additional findings.   Subjective: Chief Complaint  Patient presents with   Left Ankle - Pain  Patient presents today for left ankle pain. She said that it has been hurting for a month. No known injury, but she does a lot of work outside in her yard. She said that her pain is located medially and laterally, with some swelling. She said that it is  stiff upon getting up. She does not take anything for pain.  HPI  Review of Systems   Objective: Vital Signs: There were no vitals taken for this visit.  Physical Exam Constitutional:      Appearance: She is well-developed.  Pulmonary:     Effort: Pulmonary effort is normal.  Skin:    General: Skin is warm and dry.  Neurological:     Mental Status: She is alert and oriented to person, place, and time.  Psychiatric:        Behavior: Behavior normal.    Ortho Exam awake alert and oriented x3.  Comfortable sitting.  Had a little bit of a limp when she first gets up from its seated position with some mild discomfort over the anterior talofibular ligament left ankle.  No instability or opening.  Good pulses.  Neurologically intact and no edema.  No pain along the peroneal tendons or behind the medial malleolus.  Has significant pronation.  Large bunion with hallux valgus but no discomfort in that area.  Some very minimal discomfort over the plantar fascial attachment to the medial os calcis.  Hypertrophic changes in the midfoot consistent with arthritis and minimal discomfort.  No erythema or ecchymosis.  Specialty Comments:  No specialty comments available.  Imaging: XR Ankle Complete Left  Result Date: 06/12/2021 3 views of the left ankle and AP of the left foot were obtained.  There may be some degenerative changes in the midfoot consistent with the patient's symptoms of discomfort on arising in the midfoot.  There is ectopic calcification along the posterior aspect of the os calcis consistent with spurring and possibly insertional Achilles tendinitis.  Patient does have some discomfort in the plantar aspect of her foot consistent with plantar fasciitis but no significant plantar spurring.  Ankle joint was intact.  Considerable degenerative change at the first metatarsal phalangeal joint with a large bunion but that area is asymptomatic    PMFS History: Patient Active Problem List    Diagnosis Date Noted   Pain in left ankle and joints of left foot 06/12/2021   Pain in left leg 04/13/2019   History reviewed. No pertinent past medical history.  History reviewed. No pertinent family history.  History reviewed. No pertinent surgical history. Social History   Occupational History   Not on file  Tobacco Use   Smoking status: Not on file   Smokeless tobacco: Not on file  Substance and Sexual Activity   Alcohol use: Not on file   Drug use: Not on file   Sexual activity: Not on file

## 2021-11-06 ENCOUNTER — Emergency Department (HOSPITAL_COMMUNITY): Payer: Medicare Other

## 2021-11-06 ENCOUNTER — Ambulatory Visit: Payer: Self-pay

## 2021-11-06 ENCOUNTER — Ambulatory Visit (INDEPENDENT_AMBULATORY_CARE_PROVIDER_SITE_OTHER): Payer: Medicare Other | Admitting: Orthopaedic Surgery

## 2021-11-06 ENCOUNTER — Inpatient Hospital Stay (HOSPITAL_COMMUNITY)
Admission: EM | Admit: 2021-11-06 | Discharge: 2021-11-13 | DRG: 481 | Disposition: A | Discharge status: Skilled Nursing Facility | Payer: Medicare Other | Attending: Internal Medicine | Admitting: Internal Medicine

## 2021-11-06 ENCOUNTER — Other Ambulatory Visit: Payer: Self-pay

## 2021-11-06 ENCOUNTER — Encounter: Payer: Self-pay | Admitting: Orthopaedic Surgery

## 2021-11-06 ENCOUNTER — Emergency Department: Payer: Self-pay

## 2021-11-06 ENCOUNTER — Encounter (HOSPITAL_COMMUNITY): Payer: Self-pay | Admitting: Internal Medicine

## 2021-11-06 DIAGNOSIS — S72011A Unspecified intracapsular fracture of right femur, initial encounter for closed fracture: Secondary | ICD-10-CM | POA: Diagnosis present

## 2021-11-06 DIAGNOSIS — Z7982 Long term (current) use of aspirin: Secondary | ICD-10-CM | POA: Diagnosis not present

## 2021-11-06 DIAGNOSIS — S72091A Other fracture of head and neck of right femur, initial encounter for closed fracture: Secondary | ICD-10-CM | POA: Diagnosis not present

## 2021-11-06 DIAGNOSIS — Z20822 Contact with and (suspected) exposure to covid-19: Secondary | ICD-10-CM | POA: Diagnosis present

## 2021-11-06 DIAGNOSIS — S72001A Fracture of unspecified part of neck of right femur, initial encounter for closed fracture: Secondary | ICD-10-CM | POA: Diagnosis not present

## 2021-11-06 DIAGNOSIS — M79604 Pain in right leg: Secondary | ICD-10-CM | POA: Diagnosis not present

## 2021-11-06 DIAGNOSIS — S50311A Abrasion of right elbow, initial encounter: Secondary | ICD-10-CM | POA: Diagnosis present

## 2021-11-06 DIAGNOSIS — M25561 Pain in right knee: Secondary | ICD-10-CM | POA: Diagnosis not present

## 2021-11-06 DIAGNOSIS — Z111 Encounter for screening for respiratory tuberculosis: Secondary | ICD-10-CM

## 2021-11-06 DIAGNOSIS — S72009A Fracture of unspecified part of neck of unspecified femur, initial encounter for closed fracture: Secondary | ICD-10-CM

## 2021-11-06 DIAGNOSIS — D62 Acute posthemorrhagic anemia: Secondary | ICD-10-CM | POA: Diagnosis not present

## 2021-11-06 DIAGNOSIS — Z419 Encounter for procedure for purposes other than remedying health state, unspecified: Secondary | ICD-10-CM

## 2021-11-06 DIAGNOSIS — Z9049 Acquired absence of other specified parts of digestive tract: Secondary | ICD-10-CM | POA: Diagnosis not present

## 2021-11-06 DIAGNOSIS — M25551 Pain in right hip: Secondary | ICD-10-CM | POA: Diagnosis present

## 2021-11-06 DIAGNOSIS — W19XXXA Unspecified fall, initial encounter: Secondary | ICD-10-CM

## 2021-11-06 DIAGNOSIS — W010XXA Fall on same level from slipping, tripping and stumbling without subsequent striking against object, initial encounter: Secondary | ICD-10-CM | POA: Diagnosis present

## 2021-11-06 DIAGNOSIS — Z79899 Other long term (current) drug therapy: Secondary | ICD-10-CM

## 2021-11-06 DIAGNOSIS — Z8249 Family history of ischemic heart disease and other diseases of the circulatory system: Secondary | ICD-10-CM | POA: Diagnosis not present

## 2021-11-06 HISTORY — DX: Unspecified osteoarthritis, unspecified site: M19.90

## 2021-11-06 LAB — CBC WITH DIFFERENTIAL/PLATELET
Abs Immature Granulocytes: 0.02 10*3/uL (ref 0.00–0.07)
Basophils Absolute: 0.1 10*3/uL (ref 0.0–0.1)
Basophils Relative: 1 %
Eosinophils Absolute: 0.1 10*3/uL (ref 0.0–0.5)
Eosinophils Relative: 1 %
HCT: 42.5 % (ref 36.0–46.0)
Hemoglobin: 14.3 g/dL (ref 12.0–15.0)
Immature Granulocytes: 0 %
Lymphocytes Relative: 47 %
Lymphs Abs: 2.7 10*3/uL (ref 0.7–4.0)
MCH: 31.3 pg (ref 26.0–34.0)
MCHC: 33.6 g/dL (ref 30.0–36.0)
MCV: 93 fL (ref 80.0–100.0)
Monocytes Absolute: 0.4 10*3/uL (ref 0.1–1.0)
Monocytes Relative: 7 %
Neutro Abs: 2.5 10*3/uL (ref 1.7–7.7)
Neutrophils Relative %: 44 %
Platelets: 222 10*3/uL (ref 150–400)
RBC: 4.57 MIL/uL (ref 3.87–5.11)
RDW: 12.8 % (ref 11.5–15.5)
WBC: 5.8 10*3/uL (ref 4.0–10.5)
nRBC: 0 % (ref 0.0–0.2)

## 2021-11-06 LAB — BASIC METABOLIC PANEL
Anion gap: 8 (ref 5–15)
BUN: 15 mg/dL (ref 8–23)
CO2: 28 mmol/L (ref 22–32)
Calcium: 9.7 mg/dL (ref 8.9–10.3)
Chloride: 103 mmol/L (ref 98–111)
Creatinine, Ser: 0.75 mg/dL (ref 0.44–1.00)
GFR, Estimated: >60 mL/min (ref >60)
Glucose, Bld: 96 mg/dL (ref 70–99)
Potassium: 3.6 mmol/L (ref 3.5–5.1)
Sodium: 139 mmol/L (ref 135–145)

## 2021-11-06 LAB — I-STAT CHEM 8, ED
BUN: 17 mg/dL (ref 8–23)
Calcium, Ion: 1.25 mmol/L (ref 1.15–1.40)
Chloride: 103 mmol/L (ref 98–111)
Creatinine, Ser: 0.6 mg/dL (ref 0.44–1.00)
Glucose, Bld: 94 mg/dL (ref 70–99)
HCT: 42 % (ref 36.0–46.0)
Hemoglobin: 14.3 g/dL (ref 12.0–15.0)
Potassium: 3.9 mmol/L (ref 3.5–5.1)
Sodium: 142 mmol/L (ref 135–145)
TCO2: 30 mmol/L (ref 22–32)

## 2021-11-06 LAB — RESP PANEL BY RT-PCR (FLU A&B, COVID) ARPGX2
Influenza A by PCR: NEGATIVE
Influenza B by PCR: NEGATIVE
SARS Coronavirus 2 by RT PCR: NEGATIVE

## 2021-11-06 LAB — SURGICAL PCR SCREEN
MRSA, PCR: NEGATIVE
Staphylococcus aureus: NEGATIVE

## 2021-11-06 MED ORDER — LORATADINE 10 MG PO TABS
10.0000 mg | ORAL_TABLET | Freq: Every day | ORAL | Status: DC
  Administered 2021-11-06 – 2021-11-13 (×8): 10 mg via ORAL
  Filled 2021-11-06 (×8): qty 1

## 2021-11-06 MED ORDER — ENOXAPARIN SODIUM 40 MG/0.4ML IJ SOSY
40.0000 mg | PREFILLED_SYRINGE | INTRAMUSCULAR | Status: DC
  Administered 2021-11-06: 40 mg via SUBCUTANEOUS
  Filled 2021-11-06: qty 0.4

## 2021-11-06 MED ORDER — ASPIRIN EC 81 MG PO TBEC
81.0000 mg | DELAYED_RELEASE_TABLET | Freq: Every day | ORAL | Status: DC
  Administered 2021-11-06: 81 mg via ORAL
  Filled 2021-11-06: qty 1

## 2021-11-06 MED ORDER — HYDROCODONE-ACETAMINOPHEN 5-325 MG PO TABS: 1.0000 | ORAL_TABLET | Freq: Four times a day (QID) | ORAL | Status: DC | PRN

## 2021-11-06 MED ORDER — MORPHINE SULFATE (PF) 2 MG/ML IV SOLN: 0.5000 mg | INTRAVENOUS | Status: DC | PRN

## 2021-11-06 MED ORDER — SENNA 8.6 MG PO TABS
1.0000 | ORAL_TABLET | Freq: Two times a day (BID) | ORAL | Status: DC
  Administered 2021-11-07 – 2021-11-13 (×7): 8.6 mg via ORAL
  Filled 2021-11-06 (×11): qty 1

## 2021-11-06 NOTE — ED Provider Triage Note (Signed)
Emergency Medicine Provider Triage Evaluation Note ? ?Shelby Chan , a 88 y.o. female  was evaluated in triage.  Pt complains of right hip pain.  Patient had a fall 2 weeks ago and has been "wobbling" around with ambulation since then.  She was seen by orthopedist this morning and diagnosed with an impacted right subcapital hip fracture on x-rays.  She was told to come to the ER after her provider contacted the on-call orthopedist Dr. Sammuel Hines.  Denies any other injuries from her fall.  Denies any anticoagulant use.  No prior hip fracture, dislocation or procedure ? ?Review of Systems  ?Positive: Right hip pain ?Negative: Numbness, weakness ? ?Physical Exam  ?BP (!) 148/66   Pulse 67   Temp 98.6 ?F (37 ?C) (Oral)   Resp 16   SpO2 97%  ?Gen:   Awake, no distress   ?Resp:  Normal effort  ?MSK:   Moves extremities without difficulty  ?Other:  Limited range of motion of the right hip secondary to pain. ? ?Medical Decision Making  ?Medically screening exam initiated at 12:04 PM.  Appropriate orders placed.  Shelby Chan was informed that the remainder of the evaluation will be completed by another provider, this initial triage assessment does not replace that evaluation, and the importance of remaining in the ED until their evaluation is complete. ? ?I have spoken to Hilbert Odor, on-call orthopedic PA about patient's arrival and will see the patient in consult.  I have informed charge nurse that patient will need a room ?  ?Delia Heady, PA-C ?11/06/21 1206 ? ?

## 2021-11-06 NOTE — Consult Note (Signed)
Reason for Consult:Right hip fx ?Referring Physician: Melene Plan ?Time called: 1203 ?Time at bedside: 1251 ? ? ?Shelby Chan is an 88 y.o. female.  ?HPI: Shelby Chan was walking into church about 2 weeks ago when she slipped on some wet pavement and fell onto her right side. She had immediate pain all down that side but was able to get up. She's been dealing with the pain, which was mostly in the left, but decided she needed evaluation so saw Dr. Cleophas Dunker today. X-rays showed a femoral neck fx and she was sent to [facility] for definitive care. She lives alone and does not use any assistive devices at baseline. ? ?No past medical history on file. ? ?No past surgical history on file. ? ?No family history on file. ? ?Social History:  has no history on file for tobacco use, alcohol use, and drug use. ? ?Allergies: No Known Allergies ? ?Medications: I have reviewed the patient's current medications. ? ?No results found for this or any previous visit (from the past 48 hour(s)). ? ?XR HIP UNILAT W OR W/O PELVIS 2-3 VIEWS RIGHT ? ?Result Date: 11/06/2021 ?Radiographs AP pelvis of the right hip demonstrate impacted subcapital hip fracture of the right hip.  Femoral head is reduced in the acetabulum no other apparent osseous findings  ? ?Review of Systems  ?HENT:  Negative for ear discharge, ear pain, hearing loss and tinnitus.   ?Eyes:  Negative for photophobia and pain.  ?Respiratory:  Negative for cough and shortness of breath.   ?Cardiovascular:  Negative for chest pain.  ?Gastrointestinal:  Negative for abdominal pain, nausea and vomiting.  ?Genitourinary:  Negative for dysuria, flank pain, frequency and urgency.  ?Musculoskeletal:  Positive for arthralgias (Right hip). Negative for back pain, myalgias and neck pain.  ?Neurological:  Negative for dizziness and headaches.  ?Hematological:  Does not bruise/bleed easily.  ?Psychiatric/Behavioral:  The patient is not nervous/anxious.   ?Blood pressure (!) 148/66, pulse 67,  temperature 98.6 ?F (37 ?C), temperature source Oral, resp. rate 16, SpO2 97 %. ?Physical Exam ?Constitutional:   ?   General: She is not in acute distress. ?   Appearance: She is well-developed. She is not diaphoretic.  ?HENT:  ?   Head: Normocephalic and atraumatic.  ?Eyes:  ?   General: No scleral icterus.    ?   Right eye: No discharge.     ?   Left eye: No discharge.  ?   Conjunctiva/sclera: Conjunctivae normal.  ?Cardiovascular:  ?   Rate and Rhythm: Normal rate and regular rhythm.  ?Pulmonary:  ?   Effort: Pulmonary effort is normal. No respiratory distress.  ?Musculoskeletal:  ?   Cervical back: Normal range of motion.  ?   Comments: RLE No traumatic wounds, ecchymosis, or rash ? Mild TTP hip ? No knee or ankle effusion ? Knee stable to varus/ valgus and anterior/posterior stress ? Sens DPN, SPN, TN intact ? Motor EHL, ext, flex, evers 5/5 ? DP 2+, PT 0, No significant edema  ?Skin: ?   General: Skin is warm and dry.  ?Neurological:  ?   Mental Status: She is alert.  ?Psychiatric:     ?   Mood and Affect: Mood normal.     ?   Behavior: Behavior normal.  ? ? ?Assessment/Plan: ?Right hip fx -- Plan fixation tomorrow with Dr. Steward Drone. Unable to view prior films so will need to get repeat to determine which surgery will be best for pt. Medicine to  admit, NWB RLE, NPO after MN. ? ? ? ?Freeman Caldron, PA-C ?Orthopedic Surgery ?[phone] ?11/06/2021, 1:00 PM  ?

## 2021-11-06 NOTE — Progress Notes (Addendum)
? ?Office Visit Note ?  ?Patient: Shelby Chan           ?Date of Birth: 1933-03-31           ?MRN: [id number] ?Visit Date: 11/06/2021 ?             ?Requested by: Dorice Lamas, MD ?[address] ?[address] ?[place],  [place] [address] ?PCP: Dorice Lamas, MD ? ? ?Assessment & Plan: ?Visit Diagnoses: Right hip pain status post fall ? ?Plan: Jaleigh is a healthy 88 year old woman who fell 2 weeks ago.  She presents today complaining of pain from her hip down to her knee.  She denies any loss of consciousness any other injuries except for an abrasion to her right elbow.  Her daughter brings her in because she is concerned that her mother is normally very active and is not wanting to go on her regular walks and is having increasing pain with weightbearing.  X-rays of her hip were done today and demonstrated an impacted right subcapital hip fracture.  Explained this by Dr. Cleophas Dunker to the daughter and the patient.  We have contacted the emergency room at [facility] and Dr. Steward Drone who is on-call for our practice today.  Patient will be taken by her family to [facility] emergency room . ? ?Follow-Up Instructions: No follow-ups on file.  ? ?Orders:  ?No orders of the defined types were placed in this encounter. ? ?No orders of the defined types were placed in this encounter. ? ? ? ? Procedures: ?No procedures performed ? ? ?Clinical Data: ?No additional findings. ? ? ?Subjective: ?Chief Complaint  ?Patient presents with  ? Right Leg - Injury, Pain  ?Patient presents today for with right leg pain. She said that she fell two weeks ago while getting doughnuts on the way to church. She is having a very difficult time walking. She is complaining of pain in her right buttock and thigh area. No groin pain and no complaints of lower back pain. She is taking Tylenol.  ? ? ? ?Review of Systems  ?All other systems reviewed and are negative. ? ? ?Objective: ?Vital Signs: There were no vitals taken for this  visit. ? ?Physical Exam ?Constitutional:   ?   Appearance: Normal appearance.  ?HENT:  ?   Head: Normocephalic and atraumatic.  ?Pulmonary:  ?   Effort: Pulmonary effort is normal.  ?Skin: ?   General: Skin is warm.  ?Neurological:  ?   General: No focal deficit present.  ?   Mental Status: She is alert and oriented to person, place, and time.  ? ? ?Ortho Exam ?Examination of her right lower extremity she does have a leg length discrepancy.  She is excruciatingly painful in the groin down to her knee.  Rotation of the hip increases the pain.  She has difficulty bearing weight.  Distal pulses and sensation are intact.  Compartments are soft and tender ?Specialty Comments:  ?No specialty comments available. ? ?Imaging: ?No results found. ? ? ?PMFS History: ?Patient Active Problem List  ? Diagnosis Date Noted  ? Pain in left ankle and joints of left foot 06/12/2021  ? Pain in left leg 04/13/2019  ? ?No past medical history on file.  ?No family history on file.  ?No past surgical history on file. ?Social History  ? ?Occupational History  ? Not on file  ?Tobacco Use  ? Smoking status: Not on file  ? Smokeless tobacco: Not on file  ?Substance  and Sexual Activity  ? Alcohol use: Not on file  ? Drug use: Not on file  ? Sexual activity: Not on file  ? ? ? ? ? ? ?

## 2021-11-06 NOTE — ED Provider Notes (Signed)
?[facility] EMERGENCY DEPARTMENT ?Provider Note ? ? ?CSN: [id number] ?Arrival date & time: 11/06/21  1135 ? ?  ? ?History ? ?Chief Complaint  ?Patient presents with  ? Fall  ? Hip Pain  ? ? ?Shelby Chan is a 88 y.o. female. ? ?88 yo F with a chief complaint of right hip pain.  The patient had a fall about 2 weeks ago and has been having pain to that area off and on.  She was able to walk on it but was having some pain especially when she rolls onto that side.  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  She went to see her orthopedic doctor today who obtained a plain film and diagnosed her with a right hip fracture and sent her in for admission. ? ? ?Fall ? ?Hip Pain ? ? ?  ? ?Home Medications ?Prior to Admission medications   ?Medication Sig Start Date End Date Taking? Authorizing Provider  ?aspirin EC 81 MG tablet Take 81 mg by mouth daily.    [provider]  ?cholecalciferol (VITAMIN D3) 25 MCG (1000 UT) tablet Take 1,000 Units by mouth daily.    [provider]  ?   ? ?Allergies    ?Patient has no known allergies.   ? ?Review of Systems   ?Review of Systems ? ?Physical Exam ?Updated Vital Signs ?BP (!) 141/95   Pulse 76   Temp 98.6 ?F (37 ?C) (Oral)   Resp 15   SpO2 98%  ?Physical Exam ?Vitals and nursing note reviewed.  ?Constitutional:   ?   General: She is not in acute distress. ?   Appearance: She is well-developed. She is not diaphoretic.  ?HENT:  ?   Head: Normocephalic and atraumatic.  ?Eyes:  ?   Pupils: Pupils are equal, round, and reactive to light.  ?Cardiovascular:  ?   Rate and Rhythm: Normal rate and regular rhythm.  ?   Heart sounds: No murmur heard. ?  No friction rub. No gallop.  ?Pulmonary:  ?   Effort: Pulmonary effort is normal.  ?   Breath sounds: No wheezing or rales.  ?Abdominal:  ?   General: There is no distension.  ?   Palpations: Abdomen is  soft.  ?   Tenderness: There is no abdominal tenderness.  ?Musculoskeletal:     ?   General: Tenderness present.  ?   Cervical back: Normal range of motion and neck supple.  ?   Comments: Slight leg discrepancy between the right and the left.  Pulse motor and sensation intact distally.  Pain along the right proximal femur.  ?Skin: ?   General: Skin is warm and dry.  ?Neurological:  ?   Mental Status: She is alert and oriented to person, place, and time.  ?Psychiatric:     ?   Behavior: Behavior normal.  ? ? ?ED Results / Procedures / Treatments   ?Labs ?(all labs ordered are listed, but only abnormal results are displayed) ?Labs Reviewed  ?RESP PANEL BY RT-PCR (FLU A&B, COVID) ARPGX2  ?CBC WITH DIFFERENTIAL/PLATELET  ?BASIC METABOLIC PANEL  ?I-STAT CHEM 8, ED  ? ? ?EKG ?None ? ?Radiology ?DG Chest Port 1 View ? ?Result Date: 11/06/2021 ?CLINICAL DATA:  Preop evaluation EXAM: PORTABLE CHEST 1 VIEW COMPARISON:  None. FINDINGS: The heart size and mediastinal contours are within  normal limits. Both lungs are clear. The visualized skeletal structures are unremarkable. IMPRESSION: No active disease. Electronically Signed   By: Ernie Avena M.D.   On: 11/06/2021 12:59  ? ?XR HIP UNILAT W OR W/O PELVIS 2-3 VIEWS RIGHT ? ?Result Date: 11/06/2021 ?Radiographs AP pelvis of the right hip demonstrate impacted subcapital hip fracture of the right hip.  Femoral head is reduced in the acetabulum no other apparent osseous findings ? ?DG Outside Films Extremity ? ?Result Date: 11/06/2021 ?This examination belongs to an outside facility and is stored here for comparison purposes only.  Contact the originating outside institution for any associated report or interpretation.  ? ?Procedures ?Procedures  ? ? ?Medications Ordered in ED ?Medications - No data to display ? ?ED Course/ Medical Decision Making/ A&P ?  ?                        ?Medical Decision Making ?Amount and/or Complexity of Data Reviewed ?Labs: ordered. ?Radiology:  ordered. ?ECG/medicine tests: ordered. ? ? ?88 yo F with no significant past medical history comes in with a chief complaints of right hip pain.  This occurred after a fall couple weeks ago.  She was able to walk on it for some time but was having progressive worsening pain and saw her orthopedic doctor in the office.  He diagnosed her with a right subcapital hip fracture and sent her to the ED for admission. ? ?I discussed the case with Earney Hamburg, on-call for orthopedics to come and see the patient at bedside.  Recommended hospitalist admission plan to do the procedure tomorrow. ? ?I offered the patient IV narcotics which she has thus far declined. ? ?Patient's blood work without any renal dysfunction no significant electrolyte abnormality no anemia no leukocytosis.  Chest x-ray independently interpreted by me without focal infiltrate.  Will discuss with medicine for admission. ? ?The patients results and plan were reviewed and discussed.   ?Any x-rays performed were independently reviewed by myself.  ? ?Differential diagnosis were considered with the presenting HPI. ? ?Medications - No data to display ? ?Vitals:  ? 11/06/21 1139 11/06/21 1345  ?BP: (!) 148/66 (!) 141/95  ?Pulse: 67 76  ?Resp: 16 15  ?Temp: 98.6 ?F (37 ?C)   ?TempSrc: Oral   ?SpO2: 97% 98%  ? ? ?Final diagnoses:  ?Hip fracture (HCC)  ? ? ?Admission/ observation were discussed with the admitting physician, patient and/or family and they are comfortable with the plan.  ? ? ? ? ? ? ? ? ? ?Final Clinical Impression(s) / ED Diagnoses ?Final diagnoses:  ?Hip fracture (HCC)  ? ? ?Rx / DC Orders ?ED Discharge Orders   ? ? None  ? ?  ? ? ?  ?Melene Plan, DO ?11/06/21 1435 ? ?

## 2021-11-06 NOTE — Assessment & Plan Note (Addendum)
Patient presented with complaints of right hip pain over the last 2 weeks after having a mechanical fall.  Seen in [facility] clinic  where x-rays noted an impacted right subcapital hip fracture. Remote history of fracture of the right leg. ?Orthopedics following s/p right hip screw placement for femoral neck fracture on 3/9. ?Continue  pain management, supportive care, bowel regimen. ?PT/OT evaluation done: Recommended skilled nursing facilty on discharge., DVT prophylaxis with lovenox. ? ?

## 2021-11-06 NOTE — ED Triage Notes (Signed)
Pt and family reports patient slipped and fell two weeks ago, went to ortho md today due to right hip pain and reports +fracture.  ?

## 2021-11-06 NOTE — Addendum Note (Signed)
Addended by: Wendi Maya on: 11/06/2021 01:04 PM ? ? Modules accepted: Orders ? ?

## 2021-11-06 NOTE — H&P (Signed)
?History and Physical  ? ? ?Patient: Shelby Chan DOB: 1932/12/15 ?DOA: 11/06/2021 ?DOS: the patient was seen and examined on 11/06/2021 ?PCP: Donalynn Furlong, MD  ?Patient coming from: [place] office ? ?Chief Complaint:  ?Chief Complaint  ?Patient presents with  ? Fall  ? Hip Pain  ? ?HPI: Shelby Chan is a 88 y.o. female without significant past medical history presents with complaints of right hip pain.  History is obtained from the patient present at bedside.  She had been on her way to church 2 weeks ago and stated that it was raining outside.  Shelby Chan had stopped to get some donuts to go with the coffee at church and had on her new shoes.  She tried quickly going into the store when she reports falling landing on her right side.  She thinks she slipped because of the rain outside and due to the new shoes she was wearing. She sustained an abrasion over her right elbow, but has been able to get up on her own.  She does not think that she lost consciousness.  Thereafter she started having mild aching pain in her right hip, but was able to get around without use of any assistive devices.  However, pain progressively worsening to the point which she is having difficulty putting any weight and needing assistance to try and stand.  She had followed up with [facility] today due to symptoms and in office x-rays revealed a impacted right subcapital hip fracture.  She reports remote history of fracture of the right leg requiring 2 pins to be placed  ? ? ? ?Review of Systems: As mentioned in the history of present illness. All other systems reviewed and are negative. ?Past Medical History:  ?Diagnosis Date  ? Arthritis   ? ?Past Surgical History:  ?Procedure Laterality Date  ? CHOLECYSTECTOMY    ? ?Social History:  has no history on file for tobacco use, alcohol use, and drug use. ? ?No Known Allergies ? ?Family History  ?Problem Relation Age of Onset  ? Heart disease Mother   ? ? ?Prior to  Admission medications   ?Medication Sig Start Date End Date Taking? Authorizing Provider  ?aspirin EC 81 MG tablet Take 81 mg by mouth daily.    [provider]  ?cholecalciferol (VITAMIN D3) 25 MCG (1000 UT) tablet Take 1,000 Units by mouth daily.    [provider]  ? ? ?Physical Exam: ?Vitals:  ? 11/06/21 1139 11/06/21 1345  ?BP: (!) 148/66 (!) 141/95  ?Pulse: 67 76  ?Resp: 16 15  ?Temp: 98.6 ?F (37 ?C)   ?TempSrc: Oral   ?SpO2: 97% 98%  ? ?Constitutional: Elderly female in NAD, calm, comfortable ?Eyes: PERRL, lids and conjunctivae normal ?ENMT: Mucous membranes are moist.   ?Neck: normal, supple, no masses, no thyromegaly ?Respiratory: clear to auscultation bilaterally, no wheezing, no crackles. Normal respiratory effort. No accessory muscle use.  ?Cardiovascular: Regular rate and rhythm, no murmurs / rubs / gallops. No extremity edema.  ?Abdomen: no tenderness, no masses palpated. No hepatosplenomegaly. Bowel sounds positive.  ?Musculoskeletal: no clubbing / cyanosis. TTP of the right hip ?Skin: abrasion of the right elbow healing ?Neurologic: CN 2-12 grossly intact. Sensation intact, DTR normal. Strength 5/5 in all 4.  ?Psychiatric: Normal judgment and insight. Alert and oriented x 3. Normal mood.  ? ?Data Reviewed: ? ?Chest x-ray showed no acute abnormality ? ?Assessment and Plan: ?* Impacted fracture of right hip ([place]) secondary to fall ?Patient  presented with complaints of right hip pain over the last 2 weeks after having a mechanical fall.  Seen in [facility] clinic today where x-rays noted an impacted right subcapital hip fracture.  Transferred to the ED for further evaluation.  Plan is for surgery tomorrow morning.  Remote history of fracture of the right leg req two pins to be placed. ?-Admit to a MedSurg bed ?-Hip fracture order set utilized ?-NPO after midnight ?-Hydrocodone/morphine as needed for pain ?-Appreciate orthopedic consultative services, we will follow-up for any further  recommendations ? ? ? ? ? Advance Care Planning:   Code Status: Full Code  ? ?Consults: Orthopedics ?Family Communication: none ? ?Severity of Illness: ?The appropriate patient status for this patient is INPATIENT. Inpatient status is judged to be reasonable and necessary in order to provide the required intensity of service to ensure the patient's safety. The patient's presenting symptoms, physical exam findings, and initial radiographic and laboratory data in the context of their chronic comorbidities is felt to place them at high risk for further clinical deterioration. Furthermore, it is not anticipated that the patient will be medically stable for discharge from the hospital within 2 midnights of admission.  ? ?* I certify that at the point of admission it is my clinical judgment that the patient will require inpatient hospital care spanning beyond 2 midnights from the point of admission due to high intensity of service, high risk for further deterioration and high frequency of surveillance required.* ? ?Author: ?Norval Morton, MD ?11/06/2021 2:57 PM ? ?For on call review [email or web address].  ?

## 2021-11-07 ENCOUNTER — Inpatient Hospital Stay (HOSPITAL_COMMUNITY): Payer: Medicare Other | Admitting: Anesthesiology

## 2021-11-07 ENCOUNTER — Inpatient Hospital Stay (HOSPITAL_COMMUNITY): Payer: Medicare Other

## 2021-11-07 ENCOUNTER — Other Ambulatory Visit: Payer: Self-pay

## 2021-11-07 ENCOUNTER — Encounter (HOSPITAL_COMMUNITY): Payer: Self-pay | Admitting: Internal Medicine

## 2021-11-07 ENCOUNTER — Encounter (HOSPITAL_COMMUNITY)
Admission: EM | Disposition: A | Discharge status: Skilled Nursing Facility | Payer: Self-pay | Source: Home / Self Care | Attending: Internal Medicine

## 2021-11-07 DIAGNOSIS — S72091A Other fracture of head and neck of right femur, initial encounter for closed fracture: Secondary | ICD-10-CM | POA: Diagnosis not present

## 2021-11-07 DIAGNOSIS — S72001A Fracture of unspecified part of neck of right femur, initial encounter for closed fracture: Secondary | ICD-10-CM

## 2021-11-07 HISTORY — PX: HIP PINNING,CANNULATED: SHX1758

## 2021-11-07 LAB — BASIC METABOLIC PANEL
Anion gap: 6 (ref 5–15)
BUN: 22 mg/dL (ref 8–23)
CO2: 28 mmol/L (ref 22–32)
Calcium: 9.3 mg/dL (ref 8.9–10.3)
Chloride: 107 mmol/L (ref 98–111)
Creatinine, Ser: 1 mg/dL (ref 0.44–1.00)
GFR, Estimated: 55 mL/min — ABNORMAL LOW (ref >60)
Glucose, Bld: 99 mg/dL (ref 70–99)
Potassium: 4 mmol/L (ref 3.5–5.1)
Sodium: 141 mmol/L (ref 135–145)

## 2021-11-07 LAB — TYPE AND SCREEN
ABO/RH(D): O POS
Antibody Screen: NEGATIVE

## 2021-11-07 LAB — ABO/RH: ABO/RH(D): O POS

## 2021-11-07 SURGERY — FIXATION, FEMUR, NECK, PERCUTANEOUS, USING SCREW
Anesthesia: General | Site: Hip | Laterality: Right

## 2021-11-07 MED ORDER — ONDANSETRON HCL 4 MG/2ML IJ SOLN
INTRAMUSCULAR | Status: AC
Start: 2021-11-07 — End: ?
  Filled 2021-11-07: qty 2

## 2021-11-07 MED ORDER — LACTATED RINGERS IV SOLN: INTRAVENOUS | Status: DC | PRN

## 2021-11-07 MED ORDER — PHENYLEPHRINE 40 MCG/ML (10ML) SYRINGE FOR IV PUSH (FOR BLOOD PRESSURE SUPPORT)
PREFILLED_SYRINGE | INTRAVENOUS | Status: AC
  Filled 2021-11-07: qty 10

## 2021-11-07 MED ORDER — CHLORHEXIDINE GLUCONATE 4 % EX LIQD: 60.0000 mL | Freq: Once | CUTANEOUS | Status: DC

## 2021-11-07 MED ORDER — ROCURONIUM BROMIDE 10 MG/ML (PF) SYRINGE
PREFILLED_SYRINGE | INTRAVENOUS | Status: AC
  Filled 2021-11-07: qty 10

## 2021-11-07 MED ORDER — LIDOCAINE 2% (20 MG/ML) 5 ML SYRINGE
INTRAMUSCULAR | Status: AC
  Filled 2021-11-07: qty 5

## 2021-11-07 MED ORDER — BUPIVACAINE-EPINEPHRINE (PF) 0.5% -1:200000 IJ SOLN
INTRAMUSCULAR | Status: DC | PRN
  Administered 2021-11-07: 20 mL via PERINEURAL

## 2021-11-07 MED ORDER — CEFAZOLIN SODIUM-DEXTROSE 2-4 GM/100ML-% IV SOLN
2.0000 g | Freq: Three times a day (TID) | INTRAVENOUS | Status: AC
  Administered 2021-11-07 – 2021-11-08 (×2): 2 g via INTRAVENOUS
  Filled 2021-11-07 (×2): qty 100

## 2021-11-07 MED ORDER — ACETAMINOPHEN 325 MG PO TABS
650.0000 mg | ORAL_TABLET | Freq: Four times a day (QID) | ORAL | Status: DC | PRN
  Administered 2021-11-08 – 2021-11-12 (×8): 650 mg via ORAL
  Filled 2021-11-07 (×9): qty 2

## 2021-11-07 MED ORDER — PHENYLEPHRINE HCL-NACL 20-0.9 MG/250ML-% IV SOLN
INTRAVENOUS | Status: DC | PRN
  Administered 2021-11-07: 15 ug/min via INTRAVENOUS

## 2021-11-07 MED ORDER — FENTANYL CITRATE (PF) 250 MCG/5ML IJ SOLN
INTRAMUSCULAR | Status: DC | PRN
  Administered 2021-11-07: 100 ug via INTRAVENOUS

## 2021-11-07 MED ORDER — MORPHINE SULFATE (PF) 2 MG/ML IV SOLN: 1.0000 mg | INTRAVENOUS | Status: DC | PRN

## 2021-11-07 MED ORDER — FENTANYL CITRATE (PF) 250 MCG/5ML IJ SOLN
INTRAMUSCULAR | Status: AC
  Filled 2021-11-07: qty 5

## 2021-11-07 MED ORDER — POVIDONE-IODINE 10 % EX SWAB
2.0000 "application " | Freq: Once | CUTANEOUS | Status: AC
  Administered 2021-11-07: 2 via TOPICAL

## 2021-11-07 MED ORDER — HYDROMORPHONE HCL 1 MG/ML IJ SOLN
0.2500 mg | INTRAMUSCULAR | Status: DC | PRN
  Administered 2021-11-07 (×2): 0.25 mg via INTRAVENOUS

## 2021-11-07 MED ORDER — OXYCODONE HCL 5 MG/5ML PO SOLN: 5.0000 mg | Freq: Once | ORAL | Status: DC | PRN

## 2021-11-07 MED ORDER — CEFAZOLIN SODIUM-DEXTROSE 2-4 GM/100ML-% IV SOLN
INTRAVENOUS | Status: AC
  Filled 2021-11-07: qty 100

## 2021-11-07 MED ORDER — CHLORHEXIDINE GLUCONATE 0.12 % MT SOLN
OROMUCOSAL | Status: AC
  Administered 2021-11-07: 12:00:00 15 mL via OROMUCOSAL
  Filled 2021-11-07: qty 15

## 2021-11-07 MED ORDER — AMISULPRIDE (ANTIEMETIC) 5 MG/2ML IV SOLN: 10.0000 mg | Freq: Once | INTRAVENOUS | Status: DC | PRN

## 2021-11-07 MED ORDER — PROCHLORPERAZINE EDISYLATE 10 MG/2ML IJ SOLN
10.0000 mg | Freq: Four times a day (QID) | INTRAMUSCULAR | Status: DC | PRN
  Administered 2021-11-07: 16:00:00 10 mg via INTRAVENOUS
  Filled 2021-11-07: qty 2

## 2021-11-07 MED ORDER — ONDANSETRON HCL 4 MG/2ML IJ SOLN
INTRAMUSCULAR | Status: DC | PRN
  Administered 2021-11-07: 4 mg via INTRAVENOUS

## 2021-11-07 MED ORDER — FENTANYL CITRATE (PF) 100 MCG/2ML IJ SOLN
INTRAMUSCULAR | Status: AC
  Administered 2021-11-07: 10:00:00 50 ug
  Filled 2021-11-07: qty 2

## 2021-11-07 MED ORDER — SUGAMMADEX SODIUM 200 MG/2ML IV SOLN
INTRAVENOUS | Status: DC | PRN
  Administered 2021-11-07: 160 mg via INTRAVENOUS

## 2021-11-07 MED ORDER — CHLORHEXIDINE GLUCONATE 0.12 % MT SOLN: 15.0000 mL | Freq: Once | OROMUCOSAL | Status: AC

## 2021-11-07 MED ORDER — PHENYLEPHRINE HCL (PRESSORS) 10 MG/ML IV SOLN
INTRAVENOUS | Status: AC
  Filled 2021-11-07: qty 1

## 2021-11-07 MED ORDER — PHENYLEPHRINE 40 MCG/ML (10ML) SYRINGE FOR IV PUSH (FOR BLOOD PRESSURE SUPPORT)
PREFILLED_SYRINGE | INTRAVENOUS | Status: DC | PRN
  Administered 2021-11-07 (×2): 120 ug via INTRAVENOUS
  Administered 2021-11-07 (×2): 80 ug via INTRAVENOUS

## 2021-11-07 MED ORDER — FENTANYL CITRATE (PF) 100 MCG/2ML IJ SOLN: 50.0000 ug | Freq: Once | INTRAMUSCULAR | Status: DC

## 2021-11-07 MED ORDER — PROPOFOL 10 MG/ML IV BOLUS
INTRAVENOUS | Status: DC | PRN
  Administered 2021-11-07: 130 mg via INTRAVENOUS

## 2021-11-07 MED ORDER — MEPERIDINE HCL 25 MG/ML IJ SOLN: 6.2500 mg | INTRAMUSCULAR | Status: DC | PRN

## 2021-11-07 MED ORDER — LACTATED RINGERS IV SOLN: INTRAVENOUS | Status: DC

## 2021-11-07 MED ORDER — OXYCODONE HCL 5 MG PO TABS: 5.0000 mg | ORAL_TABLET | Freq: Once | ORAL | Status: DC | PRN

## 2021-11-07 MED ORDER — PROMETHAZINE HCL 25 MG/ML IJ SOLN: 6.2500 mg | INTRAMUSCULAR | Status: DC | PRN

## 2021-11-07 MED ORDER — PROPOFOL 10 MG/ML IV BOLUS
INTRAVENOUS | Status: AC
  Filled 2021-11-07: qty 20

## 2021-11-07 MED ORDER — MIDAZOLAM HCL 2 MG/2ML IJ SOLN
INTRAMUSCULAR | Status: DC
Start: 2021-11-07 — End: 2021-11-07
  Filled 2021-11-07: qty 2

## 2021-11-07 MED ORDER — ONDANSETRON HCL 4 MG/2ML IJ SOLN
4.0000 mg | Freq: Four times a day (QID) | INTRAMUSCULAR | Status: DC | PRN
  Administered 2021-11-07: 15:00:00 4 mg via INTRAVENOUS

## 2021-11-07 MED ORDER — DEXAMETHASONE SODIUM PHOSPHATE 10 MG/ML IJ SOLN
INTRAMUSCULAR | Status: AC
  Filled 2021-11-07: qty 1

## 2021-11-07 MED ORDER — ROCURONIUM BROMIDE 10 MG/ML (PF) SYRINGE
PREFILLED_SYRINGE | INTRAVENOUS | Status: DC | PRN
  Administered 2021-11-07: 70 mg via INTRAVENOUS

## 2021-11-07 MED ORDER — CEFAZOLIN SODIUM-DEXTROSE 2-4 GM/100ML-% IV SOLN
2.0000 g | INTRAVENOUS | Status: AC
  Administered 2021-11-07: 12:00:00 2 g via INTRAVENOUS

## 2021-11-07 MED ORDER — ENOXAPARIN SODIUM 40 MG/0.4ML IJ SOSY
40.0000 mg | PREFILLED_SYRINGE | INTRAMUSCULAR | Status: DC
  Administered 2021-11-08 – 2021-11-13 (×5): 40 mg via SUBCUTANEOUS
  Filled 2021-11-07 (×5): qty 0.4

## 2021-11-07 MED ORDER — LIDOCAINE 2% (20 MG/ML) 5 ML SYRINGE
INTRAMUSCULAR | Status: DC | PRN
  Administered 2021-11-07: 60 mg via INTRAVENOUS

## 2021-11-07 MED ORDER — LIDOCAINE-EPINEPHRINE 2 %-1:100000 IJ SOLN
INTRAMUSCULAR | Status: DC | PRN
  Administered 2021-11-07: 5 mL via PERINEURAL

## 2021-11-07 MED ORDER — DEXAMETHASONE SODIUM PHOSPHATE 10 MG/ML IJ SOLN
INTRAMUSCULAR | Status: DC | PRN
  Administered 2021-11-07: 5 mg via INTRAVENOUS

## 2021-11-07 MED ORDER — HYDROMORPHONE HCL 1 MG/ML IJ SOLN
INTRAMUSCULAR | Status: AC
  Administered 2021-11-07: 14:00:00 0.5 mg via INTRAVENOUS
  Filled 2021-11-07: qty 1

## 2021-11-07 MED ORDER — 0.9 % SODIUM CHLORIDE (POUR BTL) OPTIME
TOPICAL | Status: DC | PRN
  Administered 2021-11-07: 13:00:00 1000 mL

## 2021-11-07 MED ORDER — ORAL CARE MOUTH RINSE: 15.0000 mL | Freq: Once | OROMUCOSAL | Status: AC

## 2021-11-07 MED ORDER — FENTANYL CITRATE PF 50 MCG/ML IJ SOSY: 50.0000 ug | PREFILLED_SYRINGE | Freq: Once | INTRAMUSCULAR | Status: DC

## 2021-11-07 SURGICAL SUPPLY — 29 items
BAG COUNTER SPONGE SURGICOUNT (BAG) ×3 IMPLANT
BIT DRILL CANNULATED 5.0 (BIT) ×2 IMPLANT
COVER PERINEAL POST (MISCELLANEOUS) ×3 IMPLANT
COVER SURGICAL LIGHT HANDLE (MISCELLANEOUS) ×4 IMPLANT
DRAPE STERI IOBAN 125X83 (DRAPES) ×3 IMPLANT
DRSG ADAPTIC 3X8 NADH LF (GAUZE/BANDAGES/DRESSINGS) ×1 IMPLANT
DRSG AQUACEL AG ADV 3.5X 6 (GAUZE/BANDAGES/DRESSINGS) ×2 IMPLANT
DRSG MEPILEX BORDER 4X4 (GAUZE/BANDAGES/DRESSINGS) ×1 IMPLANT
DURAPREP 26ML APPLICATOR (WOUND CARE) ×3 IMPLANT
ELECT REM PT RETURN 9FT ADLT (ELECTROSURGICAL) ×3
ELECTRODE REM PT RTRN 9FT ADLT (ELECTROSURGICAL) ×1 IMPLANT
GLOVE SURG ORTHO LTX SZ9 (GLOVE) ×3 IMPLANT
GLOVE SURG UNDER POLY LF SZ9 (GLOVE) ×3 IMPLANT
GOWN STRL REUS W/ TWL XL LVL3 (GOWN DISPOSABLE) ×4 IMPLANT
GOWN STRL REUS W/TWL XL LVL3 (GOWN DISPOSABLE) ×3
KIT BASIN OR (CUSTOM PROCEDURE TRAY) ×3 IMPLANT
KIT TURNOVER KIT B (KITS) ×3 IMPLANT
MANIFOLD NEPTUNE II (INSTRUMENTS) ×1 IMPLANT
NS IRRIG 1000ML POUR BTL (IV SOLUTION) ×3 IMPLANT
PACK GENERAL/GYN (CUSTOM PROCEDURE TRAY) ×3 IMPLANT
PAD ARMBOARD 7.5X6 YLW CONV (MISCELLANEOUS) ×6 IMPLANT
PIN THD TIP GUIDE 3.2X12 (PIN) ×6 IMPLANT
SCREW CANCELLOUS 7.0X75MM (Screw) ×2 IMPLANT
SCREW CANNULATED 7.0X80 (Screw) ×4 IMPLANT
STAPLER VISISTAT 35W (STAPLE) ×3 IMPLANT
SUT VIC AB 2-0 CTB1 (SUTURE) ×3 IMPLANT
TOWEL GREEN STERILE (TOWEL DISPOSABLE) ×3 IMPLANT
TOWEL GREEN STERILE FF (TOWEL DISPOSABLE) ×3 IMPLANT
WATER STERILE IRR 1000ML POUR (IV SOLUTION) ×1 IMPLANT

## 2021-11-07 NOTE — Discharge Instructions (Signed)
? ? ?   Discharge Instructions  ? ? ?Attending Surgeon: [name], MD ?Office Phone Number: [phone] ? ? ?Diagnosis and Procedures:   ? ?Surgeries Performed: ?Right hip percutaneous pinning ? ?Discharge Plan:  ? ? ?You are weight bearing as tolerated on your right leg ?

## 2021-11-07 NOTE — Anesthesia Preprocedure Evaluation (Signed)
Anesthesia Evaluation  ?Patient identified by MRN, date of birth, ID band ?Patient awake ? ? ? ?Reviewed: ?Allergy & Precautions, NPO status , Patient's Chart, lab work & pertinent test results ? ?History of Anesthesia Complications ?Negative for: history of anesthetic complications ? ?Airway ?Mallampati: II ? ?TM Distance: >3 FB ?Neck ROM: Full ? ? ? Dental ? ?(+) Teeth Intact, Dental Advisory Given ?  ?Pulmonary ?neg pulmonary ROS,  ?  ?breath sounds clear to auscultation ? ? ? ? ? ? Cardiovascular ?negative cardio ROS ? ? ?Rhythm:Regular  ? ?  ?Neuro/Psych ?negative neurological ROS ? negative psych ROS  ? GI/Hepatic ?negative GI ROS, Neg liver ROS,   ?Endo/Other  ?negative endocrine ROS ? Renal/GU ?negative Renal ROSLab Results ?     Component                Value               Date                 ?     CREATININE               1.00                11/07/2021           ?  ? ?  ?Musculoskeletal ? ?(+) Arthritis , Right hip fracture  ? Abdominal ?  ?Peds ? Hematology ?negative hematology ROS ?(+) Lab Results ?     Component                Value               Date                 ?     WBC                      5.8                 11/06/2021           ?     HGB                      14.3                11/06/2021           ?     HCT                      42.0                11/06/2021           ?     MCV                      93.0                11/06/2021           ?     PLT                      222                 11/06/2021           ?   ?Anesthesia Other Findings ? ? Reproductive/Obstetrics ? ?  ? ? ? ? ? ? ? ? ? ? ? ? ? ?  ?  ? ? ? ? ? ? ? ? ?  Anesthesia Physical ?Anesthesia Plan ? ?ASA: 1 ? ?Anesthesia Plan: Regional  ? ?Post-op Pain Management:   ? ?Induction:  ? ?PONV Risk Score and Plan: 2 and Treatment may vary due to age or medical condition ? ?Airway Management Planned: Nasal Cannula ? ?Additional Equipment: None ? ?Intra-op Plan:  ? ?Post-operative Plan:  ? ?Informed  Consent: I have reviewed the patients History and Physical, chart, labs and discussed the procedure including the risks, benefits and alternatives for the proposed anesthesia with the patient or authorized representative who has indicated his/her understanding and acceptance.  ? ? ? ?Dental advisory given ? ?Plan Discussed with: Anesthesiologist ? ?Anesthesia Plan Comments:   ? ? ? ? ? ? ?Anesthesia Quick Evaluation ? ?

## 2021-11-07 NOTE — Anesthesia Preprocedure Evaluation (Signed)
Anesthesia Evaluation  ?Patient identified by MRN, date of birth, ID band ?Patient awake ? ? ? ?Reviewed: ?Allergy & Precautions, NPO status , Patient's Chart, lab work & pertinent test results ? ?History of Anesthesia Complications ?Negative for: history of anesthetic complications ? ?Airway ?Mallampati: II ? ?TM Distance: >3 FB ?Neck ROM: Full ? ? ? Dental ? ?(+) Teeth Intact, Dental Advisory Given ?  ?Pulmonary ?neg pulmonary ROS,  ?  ?breath sounds clear to auscultation ? ? ? ? ? ? Cardiovascular ?negative cardio ROS ? ? ?Rhythm:Regular  ? ?  ?Neuro/Psych ?negative neurological ROS ? negative psych ROS  ? GI/Hepatic ?negative GI ROS, Neg liver ROS,   ?Endo/Other  ?negative endocrine ROS ? Renal/GU ?negative Renal ROSLab Results ?     Component                Value               Date                 ?     CREATININE               1.00                11/07/2021           ?  ? ?  ?Musculoskeletal ? ?(+) Arthritis , Osteoarthritis,  Right hip fracture  ? Abdominal ?  ?Peds ? Hematology ?negative hematology ROS ?(+) Lab Results ?     Component                Value               Date                 ?     WBC                      5.8                 11/06/2021           ?     HGB                      14.3                11/06/2021           ?     HCT                      42.0                11/06/2021           ?     MCV                      93.0                11/06/2021           ?     PLT                      222                 11/06/2021           ?   ?Anesthesia Other Findings ? ? Reproductive/Obstetrics ? ?  ? ? ? ? ? ? ? ? ? ? ? ? ? ?  ?  ? ? ? ? ? ? ? ? ?  Anesthesia Physical ? ?Anesthesia Plan ? ?ASA: 2 ? ?Anesthesia Plan: General  ? ?Post-op Pain Management:   ? ?Induction: Intravenous ? ?PONV Risk Score and Plan: 3 and Treatment may vary due to age or medical condition, Ondansetron, Dexamethasone and Midazolam ? ?Airway Management Planned: Oral ETT ? ?Additional Equipment:  None ? ?Intra-op Plan:  ? ?Post-operative Plan: Extubation in OR ? ?Informed Consent: I have reviewed the patients History and Physical, chart, labs and discussed the procedure including the risks, benefits and alternatives for the proposed anesthesia with the patient or authorized representative who has indicated his/her understanding and acceptance.  ? ? ? ?Dental advisory given ? ?Plan Discussed with: Anesthesiologist ? ?Anesthesia Plan Comments:   ? ? ? ? ? ? ?Anesthesia Quick Evaluation ? ?

## 2021-11-07 NOTE — TOC CAGE-AID Note (Signed)
Transition of Care (TOC) - CAGE-AID Screening ? ? ?Patient Details  ?Name: Shelby Chan ?MRN: [id number] ?Date of Birth: 26-Aug-1933 ? ?Transition of Care (TOC) CM/SW Contact:    ?Vara Mairena C Tarpley-Carter, LCSWA ?Phone Number: ?11/07/2021, 8:13 AM ? ? ?Clinical Narrative: ?Pt participated in Cage-Aid.  Pt stated she does not use substance or ETOH.  Pt was not offered resources, due to no usage of substance or ETOH.    ? ?Insurance underwriter, MSW, LCSW-A ?Pronouns:  She/Her/Hers ?[facility]Transitions of Care ?Clinical Social Worker ?Direct Number:  [phone] ?[email or web address]  ? ?CAGE-AID Screening: ?  ? ?Have You Ever Felt You Ought to Cut Down on Your Drinking or Drug Use?: No ?Have People Annoyed You By Critizing Your Drinking Or Drug Use?: No ?Have You Felt Bad Or Guilty About Your Drinking Or Drug Use?: No ?Have You Ever Had a Drink or Used Drugs First Thing In The Morning to Steady Your Nerves or to Get Rid of a Hangover?: No ?CAGE-AID Score: 0 ? ?Substance Abuse Education Offered: No ? ?  ? ? ? ? ? ? ?

## 2021-11-07 NOTE — Brief Op Note (Signed)
? ?  Brief Op Note ? ?Date of Surgery: ?11/07/2021 ? ?Preoperative Diagnosis: ?right hip fracture ? ?Postoperative Diagnosis: ?same ? ?Procedure: ?Procedure(s): ?RIGHT HIP PERCUTANEOUS SCREW FIXATION ? ?Implants: ?Implant Name Type Inv. Item Serial No. Manufacturer Lot No. LRB No. Used Action  ?SCREW CANNULATED 7.0X80 - [id number] Screw SCREW CANNULATED 7.0X80  [employer](ORTH,TRAU,[employer],SG)  Right 2 Implanted  ?SCREW CANCELLOUS 7.0X75MM - [id number] Screw SCREW CANCELLOUS 7.0X75MM  [employer](ORTH,TRAU,BIO,SG)  Right 1 Implanted  ? ? ?Surgeons: ?Surgeon(s): ?[name], MD ? ?Anesthesia: ?General ? ? ? ?Estimated Blood Loss: ?See anesthesia record ? ?Complications: ?None ? ?Condition to PACU: ?Stable ? ?Benancio Deeds, MD ?11/07/2021 ?1:26 PM ? ?

## 2021-11-07 NOTE — Op Note (Signed)
? ?  Date of Surgery: 11/07/2021 ? ?INDICATIONS: Shelby Chan is a 88 y.o.-year-old female with a right valgus impacted femoral neck fracture.  The risk and benefits of the procedure with discussed in detail and documented in the pre-operative evaluation. ? ?PREOPERATIVE DIAGNOSIS: 1.  Right valgus impacted femoral neck fracture ? ?POSTOPERATIVE DIAGNOSIS: Same. ? ?PROCEDURE: 1. Right hip percutaneous screw placement ? ?SURGEON: Yevonne Pax MD ? ?ASSISTANT: Seward Grater ? ?ANESTHESIA:  general ? ?IV FLUIDS AND URINE: See anesthesia record. ? ?ANTIBIOTICS: Ancef 2 g ? ?ESTIMATED BLOOD LOSS: 10 mL. ? ?IMPLANTS:  ?Implant Name Type Inv. Item Serial No. Manufacturer Lot No. LRB No. Used Action  ?SCREW CANNULATED 7.0X80 - [id number] Screw SCREW CANNULATED 7.0X80  [employer](ORTH,TRAU,BIO,SG)  Right 2 Implanted  ?SCREW CANCELLOUS 7.0X75MM - [id number] Screw SCREW CANCELLOUS 7.0X75MM  [employer](ORTH,TRAU,BIO,SG)  Right 1 Implanted  ? ? ?DRAINS: None ? ?CULTURES: None ? ?COMPLICATIONS: none ? ?DESCRIPTION OF PROCEDURE:  ? ?The patient was identified in the preoperative holding area.  The correct site was marked according universal protocol with nursing.  She was subsequently taken back to the operating room.  Anesthesia was induced.  She was prepped and draped in usual sterile fashion.  Again final timeout was performed.  A guidewire was used with fluoroscopy in order to localize the skin incision about the lateral aspect of the trochanter.  10 blade was used to incise through skin and IT band.  A Cobb was used to elevate the vastus lateralis off the bone.  A wire was then introduced and this was placed in the inferior central position under direct AP and lateral fluoroscopy.  An additional anterior superior and posterior superior wire were then placed again under direct visualization.  Once we are happy with the placement of the wires, the drill was used to drill the lateral cortex.  A measuring guide was used off  the lateral cortex to determine screw sizes.  The appropriately sized screw as noted above was placed in the correct positions.  These all had excellent purchase.  Guidewires were removed and dynamic fluoroscopy was used to confirm all screws to be in the bone.  The wound was thoroughly irrigated and closed in layers of 0 Vicryl 2-0 Vicryl and staples.  Aquacel dressing was applied.  All counts were correct at the end of the case.  She was awoken taken to the PACU without complication ? ? ? ? ? ?POSTOPERATIVE PLAN: She will be weightbearing as tolerated on the right lower extremity.  PT will be ordered for early mobilization.  I will see her back in 2 weeks for skin check and suture removal. ? ?Yevonne Pax, MD ?1:26 PM ? ? ? ?

## 2021-11-07 NOTE — H&P (View-Only) (Signed)
? ?ORTHOPAEDIC CONSULTATION ? ?REQUESTING PHYSICIAN: Burnadette Pop, MD ? ?Chief Complaint: Right femoral neck fracture ? ?HPI: ?Shelby Chan is a 88 y.o. female who presents with a valgus impacted femoral neck fracture after a fall while at church.  She is otherwise very healthy.  She does walk at home with a cane.  Denies any previous orthopedic injuries.  She does have a daughter Shelby Chan who lives in [place] occasionally helps to take care of her.  Denies any other side of injury ? ?Past Medical History:  ?Diagnosis Date  ? Arthritis   ? ?Past Surgical History:  ?Procedure Laterality Date  ? CHOLECYSTECTOMY    ? Right leg fracture s/p repair with 2 pins placed    ? ?Social History  ? ?Socioeconomic History  ? Marital status: Married  ?  Spouse name: Not on file  ? Number of children: Not on file  ? Years of education: Not on file  ? Highest education level: Not on file  ?Occupational History  ? Not on file  ?Tobacco Use  ? Smoking status: Not on file  ? Smokeless tobacco: Not on file  ?Substance and Sexual Activity  ? Alcohol use: Not on file  ? Drug use: Not on file  ? Sexual activity: Not on file  ?Other Topics Concern  ? Not on file  ?Social History Narrative  ? Not on file  ? ?Social Determinants of Health  ? ?Financial Resource Strain: Not on file  ?Food Insecurity: Not on file  ?Transportation Needs: Not on file  ?Physical Activity: Not on file  ?Stress: Not on file  ?Social Connections: Not on file  ? ?Family History  ?Problem Relation Age of Onset  ? Heart disease Mother   ? ?- negative except otherwise stated in the family history section ?No Known Allergies ?Prior to Admission medications   ?Medication Sig Start Date End Date Taking? Authorizing Provider  ?acetaminophen (TYLENOL) 500 MG tablet Take 1,000 mg by mouth every 6 (six) hours as needed for moderate pain.   Yes [provider]  ?aspirin EC 81 MG tablet Take 81 mg by mouth daily.   Yes [provider]  ?Coenzyme Q10 50  MG CAPS Take 100 mg by mouth daily.   Yes [provider]  ?loratadine (CLARITIN) 10 MG tablet Take 10 mg by mouth daily. 03/05/21  Yes [provider]  ?Multiple Vitamin (MULTIVITAMIN WITH MINERALS) TABS tablet Take 1 tablet by mouth daily.   Yes [provider]  ?Vitamin D, Ergocalciferol, (DRISDOL) 1.25 MG (50000 UNIT) CAPS capsule Take 50,000 Units by mouth once a week. 10/24/21  Yes [provider]  ?omeprazole (PRILOSEC) 20 MG capsule Take 20 mg by mouth every morning. ?Patient not taking: Reported on 11/06/2021 09/10/21   [provider]  ? ?DG Chest Port 1 View ? ?Result Date: 11/06/2021 ?CLINICAL DATA:  Preop evaluation EXAM: PORTABLE CHEST 1 VIEW COMPARISON:  None. FINDINGS: The heart size and mediastinal contours are within normal limits. Both lungs are clear. The visualized skeletal structures are unremarkable. IMPRESSION: No active disease. Electronically Signed   By: Ernie Avena M.D.   On: 11/06/2021 12:59  ? ?XR HIP UNILAT W OR W/O PELVIS 2-3 VIEWS RIGHT ? ?Result Date: 11/06/2021 ?Radiographs AP pelvis of the right hip demonstrate impacted subcapital hip fracture of the right hip.  Femoral head is reduced in the acetabulum no other apparent osseous findings ? ?DG Outside Films Extremity ? ?Result Date: 11/06/2021 ?This examination belongs to  an outside facility and is stored here for comparison purposes only.  Contact the originating outside institution for any associated report or interpretation.  ? ? ?Positive ROS: All other systems have been reviewed and were otherwise negative with the exception of those mentioned in the HPI and as above. ? ?Physical Exam: ?General: No acute distress ?Cardiovascular: No pedal edema ?Respiratory: No cyanosis, no use of accessory musculature ?GI: No organomegaly, abdomen is soft and non-tender ?Skin: No lesions in the area of chief complaint ?Neurologic: Sensation intact distally ?Psychiatric: Patient is at baseline mood and  affect ?Lymphatic: No axillary or cervical lymphadenopathy ? ?MUSCULOSKELETAL:  ?Right hip tender to palpation and with gentle logroll.  Sensation is intact all distributions of the right foot.  Is able to dorsiflex and plantarflex the right foot.  Fires EHL 2+ dorsalis pedis pulse ? ?Independent Imaging Review: ?AP pelvis and 2 views right hip: ?Valgus impacted femoral neck fracture ? ?Assessment: ?88-year-old female with a right valgus impacted femoral neck fracture after a fall while at church.  At this time given her activity status I do believe that surgical fixation is indicated in order to optimize her weightbearing status.  I discussed the surgical options including screw fixation versus hemiarthroplasty.  I did describe that occasionally the femoral neck can become displaced in between x-rays in the operating room.  If this were to happen hemiarthroplasty I would believe would be a better procedure although at this time the plan will be to proceed with cannulated screws.  After discussion with her she would like to proceed with this ? ?Plan: ?Plan for right hip percutaneous screw fixation ? ? ?After a lengthy discussion of treatment options, including risks, benefits, alternatives, complications of surgical and nonsurgical conservative options, the patient elected surgical repair.  ? ?The patient  is aware of the material risks  and complications including, but not limited to injury to adjacent structures, neurovascular injury, infection, numbness, bleeding, implant failure, thermal burns, stiffness, persistent pain, failure to heal, disease transmission from allograft, need for further surgery, dislocation, anesthetic risks, blood clots, risks of death,and others. The probabilities of surgical success and failure discussed with patient given their particular co-morbidities.The time and nature of expected rehabilitation and recovery was discussed.The patient's questions were all answered preoperatively.  No  barriers to understanding were noted. ?I explained the natural history of the disease process and Rx rationale.  I explained to the patient what I considered to be reasonable expectations given their personal situation.  The final treatment plan was arrived at through a shared patient decision making process model. ? ? ?Thank you for the consult and the opportunity to see Ms. Caradonna ? ?Burnard Enis, MD ?[facility] ?7:04 AM ? ? ? ? ?

## 2021-11-07 NOTE — Transfer of Care (Signed)
Immediate Anesthesia Transfer of Care Note ? ?Patient: Shelby Chan ? ?Procedure(s) Performed: RIGHT HIP PERCUTANEOUS SCREW FIXATION (Right: Hip) ? ?Patient Location: PACU ? ?Anesthesia Type:General ? ?Level of Consciousness: awake, alert  and oriented ? ?Airway & Oxygen Therapy: Patient Spontanous Breathing ? ?Post-op Assessment: Report given to RN and Post -op Vital signs reviewed and stable ? ?Post vital signs: Reviewed and stable ? ?Last Vitals:  ?Vitals Value Taken Time  ?BP 124/87   ?Temp    ?Pulse 74 11/07/21 1316  ?Resp 13 11/07/21 1316  ?SpO2 96 % 11/07/21 1316  ?Vitals shown include unvalidated device data. ? ?Last Pain:  ?Vitals:  ? 11/07/21 1140  ?TempSrc: Oral  ?PainSc: 2   ?   ? ?  ? ?Complications: No notable events documented. ?

## 2021-11-07 NOTE — Anesthesia Procedure Notes (Signed)
Procedure Name: Intubation ?Date/Time: 11/07/2021 12:20 PM ?Performed by: Nils Pyle, CRNA ?Pre-anesthesia Checklist: Patient identified, Emergency Drugs available, Suction available and Patient being monitored ?Patient Re-evaluated:Patient Re-evaluated prior to induction ?Oxygen Delivery Method: Circle System Utilized ?Preoxygenation: Pre-oxygenation with 100% oxygen ?Induction Type: IV induction ?Ventilation: Mask ventilation without difficulty ?Laryngoscope Size: Hyacinth Meeker and 2 ?Grade View: Grade I ?Tube type: Oral ?Tube size: 7.0 mm ?Number of attempts: 1 ?Airway Equipment and Method: Stylet and Oral airway ?Placement Confirmation: ETT inserted through vocal cords under direct vision, positive ETCO2 and breath sounds checked- equal and bilateral ?Secured at: 22 cm ?Tube secured with: Tape ?Dental Injury: Teeth and Oropharynx as per pre-operative assessment  ? ? ? ? ?

## 2021-11-07 NOTE — Anesthesia Postprocedure Evaluation (Signed)
Anesthesia Post Note ? ?Patient: Alashia Brownfield Simones ? ?Procedure(s) Performed: RIGHT HIP PERCUTANEOUS SCREW FIXATION (Right: Hip) ? ?  ? ?Patient location during evaluation: PACU ?Anesthesia Type: General ?Level of consciousness: awake and alert ?Pain management: pain level controlled ?Vital Signs Assessment: post-procedure vital signs reviewed and stable ?Respiratory status: spontaneous breathing, nonlabored ventilation and respiratory function stable ?Cardiovascular status: blood pressure returned to baseline and stable ?Postop Assessment: no apparent nausea or vomiting ?Anesthetic complications: no ? ? ?No notable events documented. ? ?Last Vitals:  ?Vitals:  ? 11/07/21 1400 11/07/21 1426  ?BP: 128/70 (!) 166/79  ?Pulse: (!) 58 84  ?Resp: 11   ?Temp: 36.9 ?C 36.5 ?C  ?SpO2: 95% 94%  ?  ?Last Pain:  ?Vitals:  ? 11/07/21 1426  ?TempSrc: Oral  ?PainSc:   ? ? ?  ?  ?  ?  ?  ?  ? ?Lowella Curb ? ? ? ? ?

## 2021-11-07 NOTE — Progress Notes (Signed)
PROGRESS NOTE  Shelby BookbinderDarlene M Rudy  ZOX:[id number] DOB: 01/04/1933 DOA: 11/06/2021 PCP: Dorice Lamashivianathan, Susan S, MD   Brief Narrative: Shelby BookbinderDarlene M Shelby Chan is a 88 y.o. female without significant past medical history presents with complaints of right hip pain.  She thinks she slipped because of the rain outside and due to the new shoes she was wearing. Thereafter she started having mild aching pain in her right hip, but was able to get around without use of any assistive devices.  However, pain progressively worsening to the point which she is having difficulty putting any weight and needing assistance to try and stand.  She had followed up with [facility] today due to symptoms and in office x-rays revealed a impacted right subcapital hip fracture.  Orthopedics consulted here, plan for ORIF with right hip percutaneous screw fixation  Assessment & Plan:  Principal Problem:   Impacted fracture of right hip (HCC) secondary to fall Active Problems:   Fall   Assessment and Plan: * Impacted fracture of right hip (HCC) secondary to fall Patient presented with complaints of right hip pain over the last 2 weeks after having a mechanical fall.  [facility] where x-rays noted an impacted right subcapital hip fracture. Remote history of fracture of the right leg req two pins to be placed. Orthopedics following.Plan for ORIF with right hip percutaneous screw fixation.  Plan for pain management, supportive care, bowel regimen. PT/OT evaluation, DVT prophylaxis after surgery         DVT prophylaxis:enoxaparin (LOVENOX) injection 40 mg Start: 11/06/21 1900     Code Status: Full Code  Family Communication: None at bedside  Patient status:Inpatient   Patient is from :Home  Anticipated discharge to:SNF  Estimated DC date:1-2 days   Consultants: Orthopedics  Procedures:ORIF  Antimicrobials:  Anti-infectives (From admission, onward)    None       Subjective: Patient seen and  examine at the bedside at PACU.She just came from orthopedics intervention. Hemodynamically stable.C/O pain on the operated site.  Objective: Vitals:   11/06/21 2004 11/06/21 2322 11/07/21 0440 11/07/21 0744  BP: 118/65 (!) 127/57 (!) 129/56 123/78  Pulse: 81 78 77 68  Resp: 17 17 17 18   Temp: 98.4 F (36.9 C) 98.1 F (36.7 C) 98.2 F (36.8 C) 98.2 F (36.8 C)  TempSrc:  Oral Oral Oral  SpO2: 97% 93% 95% 94%  Weight:      Height:        Intake/Output Summary (Last 24 hours) at 11/07/2021 0747 Last data filed at 11/07/2021 0744 Gross per 24 hour  Intake --  Output 400 ml  Net -400 ml   Filed Weights   11/06/21 1747  Weight: 72.6 kg    Examination:  General exam: Overall comfortable, not in distress,pleasant elderly female HEENT: PERRL Respiratory system:  no wheezes or crackles  Cardiovascular system: S1 & S2 heard, RRR.  Gastrointestinal system: Abdomen is nondistended, soft and nontender. Central nervous system: Alert and oriented Extremities: No edema, no clubbing ,no cyanosis,surgical wound on the right hip Skin: No rashes, no ulcers,no icterus     Data Reviewed: I have personally reviewed following labs and imaging studies  CBC: Recent Labs  Lab 11/06/21 1359 11/06/21 1408  WBC 5.8  --   NEUTROABS 2.5  --   HGB 14.3 14.3  HCT 42.5 42.0  MCV 93.0  --   PLT 222  --    Basic Metabolic Panel: Recent Labs  Lab 11/06/21 1359 11/06/21 1408 11/07/21  0124  NA 139 142 141  K 3.6 3.9 4.0  CL 103 103 107  CO2 28  --  28  GLUCOSE 96 94 99  BUN 15 17 22   CREATININE 0.75 0.60 1.00  CALCIUM 9.7  --  9.3     Recent Results (from the past 240 hour(s))  Resp Panel by RT-PCR (Flu A&B, Covid) Nasopharyngeal Swab     Status: None   Collection Time: 11/06/21 11:35 AM   Specimen: Nasopharyngeal Swab; Nasopharyngeal(NP) swabs in vial transport medium  Result Value Ref Range Status   SARS Coronavirus 2 by RT PCR NEGATIVE NEGATIVE Final    Comment:  (NOTE) SARS-CoV-2 target nucleic acids are NOT DETECTED.  The SARS-CoV-2 RNA is generally detectable in upper respiratory specimens during the acute phase of infection. The lowest concentration of SARS-CoV-2 viral copies this assay can detect is 138 copies/mL. A negative result does not preclude SARS-Cov-2 infection and should not be used as the sole basis for treatment or other patient management decisions. A negative result may occur with  improper specimen collection/handling, submission of specimen other than nasopharyngeal swab, presence of viral mutation(s) within the areas targeted by this assay, and inadequate number of viral copies(<138 copies/mL). A negative result must be combined with clinical observations, patient history, and epidemiological information. The expected result is Negative.  Fact Sheet for Patients:  01/06/22  Fact Sheet for Healthcare Providers:  BloggerCourse.com  This test is no t yet approved or cleared by the [employer] FDA and  has been authorized for detection and/or diagnosis of SARS-CoV-2 by FDA under an Emergency Use Authorization (EUA). This EUA will remain  in effect (meaning this test can be used) for the duration of the COVID-19 declaration under Section 564(b)(1) of the Act, 21 U.S.C.section 360bbb-3(b)(1), unless the authorization is terminated  or revoked sooner.       Influenza A by PCR NEGATIVE NEGATIVE Final   Influenza B by PCR NEGATIVE NEGATIVE Final    Comment: (NOTE) The Xpert Xpress SARS-CoV-2/FLU/RSV plus assay is intended as an aid in the diagnosis of influenza from Nasopharyngeal swab specimens and should not be used as a sole basis for treatment. Nasal washings and aspirates are unacceptable for Xpert Xpress SARS-CoV-2/FLU/RSV testing.  Fact Sheet for Patients: [place]  Fact Sheet for Healthcare  Providers: BloggerCourse.com  This test is not yet approved or cleared by the [employer] FDA and has been authorized for detection and/or diagnosis of SARS-CoV-2 by FDA under an Emergency Use Authorization (EUA). This EUA will remain in effect (meaning this test can be used) for the duration of the COVID-19 declaration under Section 564(b)(1) of the Act, 21 U.S.C. section 360bbb-3(b)(1), unless the authorization is terminated or revoked.  Performed at [facility], [address], [place], [place]   Surgical PCR screen     Status: None   Collection Time: 11/06/21 11:37 AM   Specimen: Nasal Mucosa; Nasal Swab  Result Value Ref Range Status   MRSA, PCR NEGATIVE NEGATIVE Final   Staphylococcus aureus NEGATIVE NEGATIVE Final    Comment: (NOTE) The Xpert SA Assay (FDA approved for NASAL specimens in patients 84 years of age and older), is one component of a comprehensive surveillance program. It is not intended to diagnose infection nor to guide or monitor treatment. Performed at [facility], [address], [place], [place]      Radiology Studies: DG Chest Port 1 View  Result Date: 11/06/2021 CLINICAL DATA:  Preop evaluation EXAM: PORTABLE CHEST 1 VIEW COMPARISON:  None. FINDINGS: The heart size and mediastinal contours are within normal limits. Both lungs are clear. The visualized skeletal structures are unremarkable. IMPRESSION: No active disease. Electronically Signed   By: Ernie Avena M.D.   On: 11/06/2021 12:59   XR HIP UNILAT W OR W/O PELVIS 2-3 VIEWS RIGHT  Result Date: 11/06/2021 Radiographs AP pelvis of the right hip demonstrate impacted subcapital hip fracture of the right hip.  Femoral head is reduced in the acetabulum no other apparent osseous findings  DG Outside Films Extremity  Result Date: 11/06/2021 This examination belongs to an outside facility and is stored here for comparison purposes only.   Contact the originating outside institution for any associated report or interpretation.   Scheduled Meds:  aspirin EC  81 mg Oral Daily   enoxaparin (LOVENOX) injection  40 mg Subcutaneous Q24H   loratadine  10 mg Oral Daily   senna  1 tablet Oral BID   Continuous Infusions:   LOS: 1 day   Burnadette Pop, MD [facility] P3/05/2022, 7:47 AM

## 2021-11-07 NOTE — TOC Initial Note (Signed)
Transition of Care (TOC) - Initial/Assessment Note  ? ? ?Patient Details  ?Name: Shelby Chan ?MRN: [id number] ?Date of Birth: [birth year] ? ?Transition of Care [facility]) CM/SW Contact:    ?Sharin Mons, RN ?Phone Number: ?11/07/2021, 4:01 PM ? ?Clinical Narrative:                 ?- s/p  R hip percutaneous screw placement, 3/9 ? From home. PT evaluation pending.... ? ?TOC team following and will assist with TOC needs... ? ?Expected Discharge Plan: [place] (vs SNF) ?Barriers to Discharge: Continued Medical Work up ? ? ?Patient Goals and CMS Choice ?  ?  ?  ? ?Expected Discharge Plan and Services ?Expected Discharge Plan: [place] (vs SNF) ?  ?  ?  ?  ?                ?  ?  ?  ?  ?  ?  ?  ?  ?  ?  ? ?Prior Living Arrangements/Services ?  ?  ?  ?       ?  ?  ?  ?  ? ?Activities of Daily Living ?Home Assistive Devices/Equipment: None ?ADL Screening (condition at time of admission) ?Patient's cognitive ability adequate to safely complete daily activities?: Yes ?Is the patient deaf or have difficulty hearing?: No ?Does the patient have difficulty seeing, even when wearing glasses/contacts?: No ?Does the patient have difficulty concentrating, remembering, or making decisions?: No ?Patient able to express need for assistance with ADLs?: Yes ?Does the patient have difficulty dressing or bathing?: No ?Independently performs ADLs?: Yes (appropriate for developmental age) ?Does the patient have difficulty walking or climbing stairs?: No ?Weakness of Legs: Right ?Weakness of Arms/Hands: None ? ?Permission Sought/Granted ?  ?  ?   ?   ?   ?   ? ?Emotional Assessment ?  ?  ?  ?  ?  ?  ? ?Admission diagnosis:  Hip fracture ([place]) [S72.009A] ?Impacted fracture of right hip ([place]) [S72.091A] ?Patient Active Problem List  ? Diagnosis Date Noted  ? Closed right hip fracture ([place]) 11/06/2021  ? Impacted fracture of right hip ([place]) secondary to fall 11/06/2021  ? Fall 11/06/2021  ? Pain in left  ankle and joints of left foot 06/12/2021  ? Pain in left leg 04/13/2019  ? ?PCP:  Donalynn Furlong, MD ?Pharmacy:   ?[facility] [id number] [place] - [address] AT [place] ?[place] ?[place] [address] ?Phone: [phone] Fax: [phone] ? ? ? ? ?Social Determinants of Health (SDOH) Interventions ?  ? ?Readmission Risk Interventions ?No flowsheet data found. ? ? ?

## 2021-11-07 NOTE — Anesthesia Procedure Notes (Signed)
Anesthesia Regional Block: Femoral nerve block  ? ?Pre-Anesthetic Checklist: , timeout performed,  Correct Patient, Correct Site, Correct Laterality,  Correct Procedure, Correct Position, site marked,  Risks and benefits discussed,  Surgical consent,  Pre-op evaluation,  At surgeon's request and post-op pain management ? ?Laterality: Right and Lower ? ?Prep: chloraprep     ?  ?Needles:  ?Injection technique: Single-shot ? ?  ? ? ?Needle Length: 9cm  ?Needle Gauge: 22  ? ? ? ?Additional Needles: ?Arrow? StimuQuik? ECHO Echogenic Stimulating PNB Needle ? ?Procedures:,,,, ultrasound used (permanent image in chart),,    ?Narrative:  ?Start time: 11/07/2021 9:41 AM ?End time: 11/07/2021 9:47 AM ?Injection made incrementally with aspirations every 5 mL. ? ?Performed by: Personally  ?Anesthesiologist: Val Eagle, MD ? ? ? ? ?

## 2021-11-07 NOTE — Interval H&P Note (Signed)
History and Physical Interval Note: ? ?11/07/2021 ?10:50 AM ? ?[name]  has presented today for surgery, with the diagnosis of right hip fracture.  The various methods of treatment have been discussed with the patient and family. After consideration of risks, benefits and other options for treatment, the patient has consented to  Procedure(s): ?HIP PERCUTANEOUS SCREWS (Right) as a surgical intervention.  The patient's history has been reviewed, patient examined, no change in status, stable for surgery.  I have reviewed the patient's chart and labs.  Questions were answered to the patient's satisfaction.   ? ? ?[name] ? ? ?

## 2021-11-07 NOTE — Consult Note (Signed)
? ?ORTHOPAEDIC CONSULTATION ? ?REQUESTING PHYSICIAN: Burnadette Pop, MD ? ?Chief Complaint: Right femoral neck fracture ? ?HPI: ?Shelby Chan is a 88 y.o. female who presents with a valgus impacted femoral neck fracture after a fall while at church.  She is otherwise very healthy.  She does walk at home with a cane.  Denies any previous orthopedic injuries.  She does have a daughter Shelby Chan who lives in [place] occasionally helps to take care of her.  Denies any other side of injury ? ?Past Medical History:  ?Diagnosis Date  ? Arthritis   ? ?Past Surgical History:  ?Procedure Laterality Date  ? CHOLECYSTECTOMY    ? Right leg fracture s/p repair with 2 pins placed    ? ?Social History  ? ?Socioeconomic History  ? Marital status: Married  ?  Spouse name: Not on file  ? Number of children: Not on file  ? Years of education: Not on file  ? Highest education level: Not on file  ?Occupational History  ? Not on file  ?Tobacco Use  ? Smoking status: Not on file  ? Smokeless tobacco: Not on file  ?Substance and Sexual Activity  ? Alcohol use: Not on file  ? Drug use: Not on file  ? Sexual activity: Not on file  ?Other Topics Concern  ? Not on file  ?Social History Narrative  ? Not on file  ? ?Social Determinants of Health  ? ?Financial Resource Strain: Not on file  ?Food Insecurity: Not on file  ?Transportation Needs: Not on file  ?Physical Activity: Not on file  ?Stress: Not on file  ?Social Connections: Not on file  ? ?Family History  ?Problem Relation Age of Onset  ? Heart disease Mother   ? ?- negative except otherwise stated in the family history section ?No Known Allergies ?Prior to Admission medications   ?Medication Sig Start Date End Date Taking? Authorizing Provider  ?acetaminophen (TYLENOL) 500 MG tablet Take 1,000 mg by mouth every 6 (six) hours as needed for moderate pain.   Yes [provider]  ?aspirin EC 81 MG tablet Take 81 mg by mouth daily.   Yes [provider]  ?Coenzyme Q10 50  MG CAPS Take 100 mg by mouth daily.   Yes [provider]  ?loratadine (CLARITIN) 10 MG tablet Take 10 mg by mouth daily. 03/05/21  Yes [provider]  ?Multiple Vitamin (MULTIVITAMIN WITH MINERALS) TABS tablet Take 1 tablet by mouth daily.   Yes [provider]  ?Vitamin D, Ergocalciferol, (DRISDOL) 1.25 MG (50000 UNIT) CAPS capsule Take 50,000 Units by mouth once a week. 10/24/21  Yes [provider]  ?omeprazole (PRILOSEC) 20 MG capsule Take 20 mg by mouth every morning. ?Patient not taking: Reported on 11/06/2021 09/10/21   [provider]  ? ?DG Chest Port 1 View ? ?Result Date: 11/06/2021 ?CLINICAL DATA:  Preop evaluation EXAM: PORTABLE CHEST 1 VIEW COMPARISON:  None. FINDINGS: The heart size and mediastinal contours are within normal limits. Both lungs are clear. The visualized skeletal structures are unremarkable. IMPRESSION: No active disease. Electronically Signed   By: Ernie Avena M.D.   On: 11/06/2021 12:59  ? ?XR HIP UNILAT W OR W/O PELVIS 2-3 VIEWS RIGHT ? ?Result Date: 11/06/2021 ?Radiographs AP pelvis of the right hip demonstrate impacted subcapital hip fracture of the right hip.  Femoral head is reduced in the acetabulum no other apparent osseous findings ? ?DG Outside Films Extremity ? ?Result Date: 11/06/2021 ?This examination belongs to  an outside facility and is stored here for comparison purposes only.  Contact the originating outside institution for any associated report or interpretation.  ? ? ?Positive ROS: All other systems have been reviewed and were otherwise negative with the exception of those mentioned in the HPI and as above. ? ?Physical Exam: ?General: No acute distress ?Cardiovascular: No pedal edema ?Respiratory: No cyanosis, no use of accessory musculature ?GI: No organomegaly, abdomen is soft and non-tender ?Skin: No lesions in the area of chief complaint ?Neurologic: Sensation intact distally ?Psychiatric: Patient is at baseline mood and  affect ?Lymphatic: No axillary or cervical lymphadenopathy ? ?MUSCULOSKELETAL:  ?Right hip tender to palpation and with gentle logroll.  Sensation is intact all distributions of the right foot.  Is able to dorsiflex and plantarflex the right foot.  Fires EHL 2+ dorsalis pedis pulse ? ?Independent Imaging Review: ?AP pelvis and 2 views right hip: ?Valgus impacted femoral neck fracture ? ?Assessment: ?88 year old female with a right valgus impacted femoral neck fracture after a fall while at church.  At this time given her activity status I do believe that surgical fixation is indicated in order to optimize her weightbearing status.  I discussed the surgical options including screw fixation versus hemiarthroplasty.  I did describe that occasionally the femoral neck can become displaced in between x-rays in the operating room.  If this were to happen hemiarthroplasty I would believe would be a better procedure although at this time the plan will be to proceed with cannulated screws.  After discussion with her she would like to proceed with this ? ?Plan: ?Plan for right hip percutaneous screw fixation ? ? ?After a lengthy discussion of treatment options, including risks, benefits, alternatives, complications of surgical and nonsurgical conservative options, the patient elected surgical repair.  ? ?The patient  is aware of the material risks  and complications including, but not limited to injury to adjacent structures, neurovascular injury, infection, numbness, bleeding, implant failure, thermal burns, stiffness, persistent pain, failure to heal, disease transmission from allograft, need for further surgery, dislocation, anesthetic risks, blood clots, risks of death,and others. The probabilities of surgical success and failure discussed with patient given their particular co-morbidities.The time and nature of expected rehabilitation and recovery was discussed.The patient's questions were all answered preoperatively.  No  barriers to understanding were noted. ?I explained the natural history of the disease process and Rx rationale.  I explained to the patient what I considered to be reasonable expectations given their personal situation.  The final treatment plan was arrived at through a shared patient decision making process model. ? ? ?Thank you for the consult and the opportunity to see Shelby Chan ? ?[name], MD ?Aldean Baker ?7:04 AM ? ? ? ? ?

## 2021-11-08 ENCOUNTER — Encounter (HOSPITAL_COMMUNITY): Payer: Self-pay | Admitting: Orthopaedic Surgery

## 2021-11-08 DIAGNOSIS — S72091A Other fracture of head and neck of right femur, initial encounter for closed fracture: Secondary | ICD-10-CM | POA: Diagnosis not present

## 2021-11-08 LAB — CBC
HCT: 37.5 % (ref 36.0–46.0)
Hemoglobin: 12.8 g/dL (ref 12.0–15.0)
MCH: 31 pg (ref 26.0–34.0)
MCHC: 34.1 g/dL (ref 30.0–36.0)
MCV: 90.8 fL (ref 80.0–100.0)
Platelets: 214 10*3/uL (ref 150–400)
RBC: 4.13 MIL/uL (ref 3.87–5.11)
RDW: 12.4 % (ref 11.5–15.5)
WBC: 10.7 10*3/uL — ABNORMAL HIGH (ref 4.0–10.5)
nRBC: 0 % (ref 0.0–0.2)

## 2021-11-08 NOTE — Care Management Important Message (Signed)
Important Message ? ?Patient Details  ?Name: Shelby Chan ?MRN: [id number] ?Date of Birth: [birth year] ? ? ?Medicare Important Message Given:  Yes ? ? ? ? ?Marylene Land  Faaris Arizpe-Martin ?11/08/2021, 3:01 PM ?

## 2021-11-08 NOTE — Progress Notes (Signed)
PROGRESS NOTE  Tanijah Morais Mamone  PYK:[id number] DOB: 1933-08-03 DOA: 11/06/2021 PCP: Dorice Lamas, MD   Brief Narrative: Eleisha Branscomb Klemz is a 88 y.o. female without significant past medical history presents with complaints of right hip pain.  She thinks she slipped because of the rain outside and due to the new shoes she was wearing. Thereafter she started having mild aching pain in her right hip, but was able to get around without use of any assistive devices.  However, pain progressively worsening to the point which she is having difficulty putting any weight and needing assistance to try and stand.  She had followed up with orthopedics as an outpatient, x-rays revealed a impacted right subcapital hip fracture.  Orthopedics consulted here, s/p right hip screw placement for femoral neck fracture on 3/9.  Pending PT/OT evaluation  Assessment & Plan:  Principal Problem:   Impacted fracture of right hip (HCC) secondary to fall Active Problems:   Fall   Assessment and Plan: * Impacted fracture of right hip (HCC) secondary to fall Patient presented with complaints of right hip pain over the last 2 weeks after having a mechanical fall.  Seen in [facility] clinic  where x-rays noted an impacted right subcapital hip fracture. Remote history of fracture of the right leg. Orthopedics following s/p right hip screw placement for femoral neck fracture on 3/9. Continue  pain management, supportive care, bowel regimen. PT/OT evaluation, DVT prophylaxis with lovenox. Might need SNF on dc         DVT prophylaxis:enoxaparin (LOVENOX) injection 40 mg Start: 11/08/21 0000     Code Status: Full Code  Family Communication: Daughter at bedside  Patient status:Inpatient   Patient is from :Home  Anticipated discharge to:SNF  Estimated DC date:1-2 days   Consultants: Orthopedics  Procedures:ORIF  Antimicrobials:  Anti-infectives (From admission, onward)    Start     Dose/Rate Route  Frequency Ordered Stop   11/07/21 2030  ceFAZolin (ANCEF) IVPB 2g/100 mL premix        2 g 200 mL/hr over 30 Minutes Intravenous Every 8 hours 11/07/21 1411 11/08/21 0551   11/07/21 1145  ceFAZolin (ANCEF) IVPB 2g/100 mL premix        2 g 200 mL/hr over 30 Minutes Intravenous On call to O.R. 11/07/21 1131 11/07/21 1235   11/07/21 1115  ceFAZolin (ANCEF) 2-4 GM/100ML-% IVPB       Note to Pharmacy: Shanda Bumps M: cabinet override      11/07/21 1115 11/07/21 1231       Subjective: Patient seen and examined at the bedside this morning.  Hemodynamically stable.  Overall comfortable without any complaints.  Pain well controlled Objective: Vitals:   11/07/21 1400 11/07/21 1426 11/07/21 2024 11/08/21 0433  BP: 128/70 (!) 166/79 (!) 105/56 (!) 98/52  Pulse: (!) 58 84 81 73  Resp: 11  14 17   Temp: 98.4 F (36.9 C) 97.7 F (36.5 C) 98 F (36.7 C) 98.4 F (36.9 C)  TempSrc:  Oral Oral   SpO2: 95% 94% 95% 93%  Weight:      Height:        Intake/Output Summary (Last 24 hours) at 11/08/2021 0754 Last data filed at 11/08/2021 0522 Gross per 24 hour  Intake 742.83 ml  Output 880 ml  Net -137.17 ml   Filed Weights   11/06/21 1747  Weight: 72.6 kg    Examination:    General exam: Overall comfortable, not in distress, pleasant elderly female HEENT: PERRL Respiratory  system:  no wheezes or crackles  Cardiovascular system: S1 & S2 heard, RRR.  Gastrointestinal system: Abdomen is nondistended, soft and nontender. Central nervous system: Alert and oriented Extremities: No edema, no clubbing ,no cyanosis, surgical wound on the right hip Skin: No rashes, no ulcers,no icterus    Data Reviewed: I have personally reviewed following labs and imaging studies  CBC: Recent Labs  Lab 11/06/21 1359 11/06/21 1408 11/08/21 0306  WBC 5.8  --  10.7*  NEUTROABS 2.5  --   --   HGB 14.3 14.3 12.8  HCT 42.5 42.0 37.5  MCV 93.0  --  90.8  PLT 222  --  214   Basic Metabolic  Panel: Recent Labs  Lab 11/06/21 1359 11/06/21 1408 11/07/21 0124  NA 139 142 141  K 3.6 3.9 4.0  CL 103 103 107  CO2 28  --  28  GLUCOSE 96 94 99  BUN 15 17 22   CREATININE 0.75 0.60 1.00  CALCIUM 9.7  --  9.3     Recent Results (from the past 240 hour(s))  Resp Panel by RT-PCR (Flu A&B, Covid) Nasopharyngeal Swab     Status: None   Collection Time: 11/06/21 11:35 AM   Specimen: Nasopharyngeal Swab; Nasopharyngeal(NP) swabs in vial transport medium  Result Value Ref Range Status   SARS Coronavirus 2 by RT PCR NEGATIVE NEGATIVE Final    Comment: (NOTE) SARS-CoV-2 target nucleic acids are NOT DETECTED.  The SARS-CoV-2 RNA is generally detectable in upper respiratory specimens during the acute phase of infection. The lowest concentration of SARS-CoV-2 viral copies this assay can detect is 138 copies/mL. A negative result does not preclude SARS-Cov-2 infection and should not be used as the sole basis for treatment or other patient management decisions. A negative result may occur with  improper specimen collection/handling, submission of specimen other than nasopharyngeal swab, presence of viral mutation(s) within the areas targeted by this assay, and inadequate number of viral copies(<138 copies/mL). A negative result must be combined with clinical observations, patient history, and epidemiological information. The expected result is Negative.  Fact Sheet for Patients:  BloggerCourse.comhttps://www.fda.gov/media/152166/download  Fact Sheet for Healthcare Providers:  SeriousBroker.ithttps://www.fda.gov/media/152162/download  This test is no t yet approved or cleared by the [place] FDA and  has been authorized for detection and/or diagnosis of SARS-CoV-2 by FDA under an Emergency Use Authorization (EUA). This EUA will remain  in effect (meaning this test can be used) for the duration of the COVID-19 declaration under Section 564(b)(1) of the Act, 21 U.S.C.section 360bbb-3(b)(1), unless the  authorization is terminated  or revoked sooner.       Influenza A by PCR NEGATIVE NEGATIVE Final   Influenza B by PCR NEGATIVE NEGATIVE Final    Comment: (NOTE) The Xpert Xpress SARS-CoV-2/FLU/RSV plus assay is intended as an aid in the diagnosis of influenza from Nasopharyngeal swab specimens and should not be used as a sole basis for treatment. Nasal washings and aspirates are unacceptable for Xpert Xpress SARS-CoV-2/FLU/RSV testing.  Fact Sheet for Patients: BloggerCourse.comhttps://www.fda.gov/media/152166/download  Fact Sheet for Healthcare Providers: SeriousBroker.ithttps://www.fda.gov/media/152162/download  This test is not yet approved or cleared by the [place] FDA and has been authorized for detection and/or diagnosis of SARS-CoV-2 by FDA under an Emergency Use Authorization (EUA). This EUA will remain in effect (meaning this test can be used) for the duration of the COVID-19 declaration under Section 564(b)(1) of the Act, 21 U.S.C. section 360bbb-3(b)(1), unless the authorization is terminated or revoked.  Performed at [facility], [address], [place], [place] [address]   Surgical PCR screen     Status: None   Collection Time: 11/06/21 11:37 AM   Specimen: Nasal Mucosa; Nasal Swab  Result Value Ref Range Status   MRSA, PCR NEGATIVE NEGATIVE Final   Staphylococcus aureus NEGATIVE NEGATIVE Final    Comment: (NOTE) The Xpert SA Assay (FDA approved for NASAL specimens in patients 93 years of age and older), is one component of a comprehensive surveillance program. It is not intended to diagnose infection nor to guide or monitor treatment. Performed at [facility], [address], [place], [place] [address]      Radiology Studies: DG Chest Port 1 View  Result Date: 11/06/2021 CLINICAL DATA:  Preop evaluation EXAM: PORTABLE CHEST 1 VIEW COMPARISON:  None. FINDINGS: The heart size and mediastinal contours are within normal limits. Both lungs are clear. The visualized  skeletal structures are unremarkable. IMPRESSION: No active disease. Electronically Signed   By: Ernie Avena M.D.   On: 11/06/2021 12:59   DG C-Arm 1-60 Min-No Report  Result Date: 11/07/2021 Fluoroscopy was utilized by the requesting physician.  No radiographic interpretation.   DG HIP UNILAT WITH PELVIS 2-3 VIEWS RIGHT  Result Date: 11/07/2021 CLINICAL DATA:  Right hip screw fixation EXAM: DG HIP (WITH OR WITHOUT PELVIS) 2-3V RIGHT COMPARISON:  None. FINDINGS: Multiple C-arm images the right hip were obtained. 3 screws across the femoral neck into the femoral head. Screw position satisfactory. Presumably there is a right femoral neck fracture which is in normal alignment. IMPRESSION: Screw fixation right femoral neck. Electronically Signed   By: Marlan Palau M.D.   On: 11/07/2021 13:11   XR HIP UNILAT W OR W/O PELVIS 2-3 VIEWS RIGHT  Result Date: 11/06/2021 Radiographs AP pelvis of the right hip demonstrate impacted subcapital hip fracture of the right hip.  Femoral head is reduced in the acetabulum no other apparent osseous findings  DG Outside Films Extremity  Result Date: 11/06/2021 This examination belongs to an outside facility and is stored here for comparison purposes only.  Contact the originating outside institution for any associated report or interpretation.   Scheduled Meds:  enoxaparin (LOVENOX) injection  40 mg Subcutaneous Q24H   fentaNYL (SUBLIMAZE) injection  50 mcg Intravenous Once   loratadine  10 mg Oral Daily   senna  1 tablet Oral BID   Continuous Infusions:   LOS: 2 days   Burnadette Pop, MD [facility] P3/06/2022, 7:54 AM

## 2021-11-08 NOTE — Evaluation (Signed)
Physical Therapy Evaluation ?Patient Details ?Name: Shelby Chan ?MRN: [id number] ?DOB: 11/02/1932 ?Today's Date: 11/08/2021 ? ?History of Present Illness ? Pt is a 88 y.o. female who presents to the orthopedic department with R hip pain after falling 2 weeks prior. X-rays revealed an impacted right subcapital hip fracture. Pt is s/p right hip screw placement for femoral neck fracture on 3/9.  No significant past medical history.  ?Clinical Impression ? Pt exhibited weakness during isolated strength testing of her R LE. She required min assist with bed mobility to get to EOB. Sit to stand required up to mod assist +2. Transfer to the recliner required use of a Stedy due to pt's knee buckling when attempting to step with L LE and shifting weight to R LE. Therapeutic exercises were performed throughout the session and given as HEP. During physical exam patient showed deficits in strength, endurance, activity tolerance. Recommending therapy services at a skilled nursing facility to address the previously stated deficits. Will continue to follow acutely to maximize functional mobility, independence and safety. ? ?   ? ?Recommendations for follow up therapy are one component of a multi-disciplinary discharge planning process, led by the attending physician.  Recommendations may be updated based on patient status, additional functional criteria and insurance authorization. ? ?Follow Up Recommendations Skilled nursing-short term rehab (<3 hours/day) ? ?  ?Assistance Recommended at Discharge Intermittent Supervision/Assistance  ?Patient can return home with the following ? Assistance with cooking/housework;Assist for transportation;Help with stairs or ramp for entrance;Two people to help with walking and/or transfers;A lot of help with bathing/dressing/bathroom ? ?  ?Equipment Recommendations Rolling walker (2 wheels)  ?Recommendations for Other Services ? OT consult  ?  ?Functional Status Assessment Patient has had a  recent decline in their functional status and demonstrates the ability to make significant improvements in function in a reasonable and predictable amount of time.  ? ?  ?Precautions / Restrictions Precautions ?Precautions: None ?Restrictions ?Weight Bearing Restrictions: Yes ?RLE Weight Bearing: Weight bearing as tolerated ?Other Position/Activity Restrictions: Pt was educated on WBAT status.  ? ?  ? ?Mobility ? Bed Mobility ?Overal bed mobility: Needs Assistance ?Bed Mobility: Supine to Sit, Sit to Supine ?  ?  ?Supine to sit: Mod assist ?  ?  ?General bed mobility comments: Pt required hand/rail to pull up on, assistance managing her legs, and assistance with her trunk. ?  ? ?Transfers ?Overall transfer level: Needs assistance ?  ?Transfers: Sit to/from Stand, Bed to chair/wheelchair/BSC ?Sit to Stand: Mod assist, +2 physical assistance, +2 safety/equipment ?  ?  ?  ?  ?  ?General transfer comment: Pt was as much as mod assist +2 for physical assistance and management of the stedy. Charlaine Dalton was utilized to get pt to recliner. During stand step transfer pt's right knee was buckling. ?Transfer via Lift Equipment: Stedy ? ?Ambulation/Gait ?  ?  ?  ?  ?  ?  ?  ?  ? ?Stairs ?  ?  ?  ?  ?  ? ?Wheelchair Mobility ?  ? ?Modified Rankin (Stroke Patients Only) ?  ? ?  ? ?Balance Overall balance assessment: Needs assistance ?Sitting-balance support: Bilateral upper extremity supported, Feet supported ?Sitting balance-Leahy Scale: Fair ?Sitting balance - Comments: Pt sat EOB needing Min assistance at first and was able to sit with min guard and perform theraputic exercises. ?  ?  ?Standing balance-Leahy Scale: Poor ?Standing balance comment: Pt's static standing balance required a RW mod assist +2 due to leg  buckling. She was unable to accept weight onto her R foot. ?  ?  ?  ?  ?  ?  ?  ?  ?  ?  ?  ?   ? ? ? ?Pertinent Vitals/Pain Pain Assessment ?Pain Assessment: No/denies pain  ? ? ?Home Living Family/patient expects to be  discharged to:: Private residence ?Living Arrangements: Alone ?Available Help at Discharge: Family ?Type of Home: House ?Home Access: Stairs to enter ?Entrance Stairs-Rails: None ?Entrance Stairs-Number of Steps: 2 ?  ?Home Layout: One level;Laundry or work area in basement (Pt does not need to go into the basement) ?Home Equipment: Kasandra Knudsen - single point ?   ?  ?Prior Function Prior Level of Function : Independent/Modified Independent ?  ?  ?  ?  ?  ?  ?Mobility Comments: Pt was independent with mobility ?ADLs Comments: Daugther would help if needed but does not require regular help with ADLs ?  ? ? ?Hand Dominance  ? Dominant Hand: Right ? ?  ?Extremity/Trunk Assessment  ? Upper Extremity Assessment ?Upper Extremity Assessment: Defer to OT evaluation ?  ? ?Lower Extremity Assessment ?Lower Extremity Assessment: RLE deficits/detail;LLE deficits/detail ?RLE Deficits / Details: Pt was able to lift leg against gravity without assistance and was painful. Heelside had limited ROM and required assistance to initiate.  PF: 5/5, DF: 3+/5 ?LLE Deficits / Details: Pt was able to lift leg against gravity without quad lag, PT was able to heel slide in the bed. PF: 5/5, DF: 4/5 ?  ? ?   ?Communication  ? Communication: No difficulties  ?Cognition Arousal/Alertness: Awake/alert ?Behavior During Therapy: [facility] for tasks assessed/performed ?Overall Cognitive Status: Within Functional Limits for tasks assessed ?  ?  ?  ?  ?  ?  ?  ?  ?  ?  ?  ?  ?  ?  ?  ?  ?  ?  ?  ? ?  ?General Comments   ? ?  ?Exercises General Exercises - Lower Extremity ?Quad Sets: AROM, Right, 10 reps, Seated, Supine ?Long Arc Quad: AROM, Right, 10 reps, Seated ?Hip Flexion/Marching: AROM, Right, 10 reps, Seated  ? ?Assessment/Plan  ?  ?PT Assessment Patient needs continued PT services  ?PT Problem List Decreased strength;Decreased mobility;Decreased safety awareness;Decreased range of motion;Decreased coordination;Decreased activity tolerance;Decreased  balance;Decreased knowledge of use of DME ? ?   ?  ?PT Treatment Interventions DME instruction;Therapeutic exercise;Balance training;Gait training;Stair training;Neuromuscular re-education;Cognitive remediation;Functional mobility training;Therapeutic activities;Patient/family education   ? ?PT Goals (Current goals can be found in the Care Plan section)  ?Acute Rehab PT Goals ?Patient Stated Goal: maintain independance ?PT Goal Formulation: With patient/family ?Time For Goal Achievement: 11/22/21 ?Potential to Achieve Goals: Fair ? ?  ?Frequency Min 3X/week ?  ? ? ?Co-evaluation   ?  ?  ?  ?  ? ? ?  ?AM-PAC PT "6 Clicks" Mobility  ?Outcome Measure Help needed turning from your back to your side while in a flat bed without using bedrails?: A Little ?Help needed moving from lying on your back to sitting on the side of a flat bed without using bedrails?: A Little ?Help needed moving to and from a bed to a chair (including a wheelchair)?: Total ?Help needed standing up from a chair using your arms (e.g., wheelchair or bedside chair)?: A Lot ?Help needed to walk in hospital room?: Total ?Help needed climbing 3-5 steps with a railing? : Total ?6 Click Score: 11 ? ?  ?End of Session Equipment Utilized  During Treatment: Gait belt ?Activity Tolerance: Patient tolerated treatment well ?Patient left: in chair;with call bell/phone within reach;with chair alarm set ?Nurse Communication: Mobility status ?PT Visit Diagnosis: Muscle weakness (generalized) (M62.81);History of falling (Z91.81);Pain ?Pain - Right/Left: Right ?Pain - part of body: Hip ?  ? ?Time: 1116-1202 ?PT Time Calculation (min) (ACUTE ONLY): 46 min ? ? ?Charges:   PT Evaluation ?$PT Eval Moderate Complexity: 1 Mod ?PT Treatments ?$Therapeutic Exercise: 8-22 mins ?  ?   ? ? ?Quenton Fetter, SPT ? ? ?Quenton Fetter ?11/08/2021, 3:21 PM ? ?

## 2021-11-08 NOTE — Progress Notes (Signed)
? ?  Subjective: ? ?Patient reports pain as mild. She has some confusion this AM. Asking me if I am a "real doctor".  ? ?Objective:  ? ?VITALS:   ?Vitals:  ? 11/07/21 1400 11/07/21 1426 11/07/21 2024 11/08/21 0433  ?BP: 128/70 (!) 166/79 (!) 105/56 (!) 98/52  ?Pulse: (!) 58 84 81 73  ?Resp: 11  14 17   ?Temp: 98.4 ?F (36.9 ?C) 97.7 ?F (36.5 ?C) 98 ?F (36.7 ?C) 98.4 ?F (36.9 ?C)  ?TempSrc:  Oral Oral   ?SpO2: 95% 94% 95% 93%  ?Weight:      ?Height:      ? ? ?Right dressing is CDI. Spontaneously extends at right knee and dorsiflexes/plantar flexes right foot. 2+ DP pulse. ? ? ?Lab Results  ?Component Value Date  ? WBC 10.7 (H) 11/08/2021  ? HGB 12.8 11/08/2021  ? HCT 37.5 11/08/2021  ? MCV 90.8 11/08/2021  ? PLT 214 11/08/2021  ? ? ? ?Assessment/Plan: ? ?1 Day Post-Op s/p right hip screw placement for femoral neck fracture ? ?- Expected postop acute blood loss anemia - will monitor for symptoms ?- Patient to work with PT/OT to optimize mobilization safely ?- DVT ppx - SCDs, ambulation, Lovenox ?- Postoperative Abx: Ancef x 2 additional doses given ?- WBAT operative extremity ?- Pain control - multimodal pain management, ATC acetaminophen in conjunction with as needed narcotic (oxycodone), although this should be minimized with other modalities  ?- Discharge planning pending CM, appreciate coordination  ? ? ? 01/08/2022 ?11/08/2021, 6:57 AM ? ?

## 2021-11-09 ENCOUNTER — Encounter (HOSPITAL_COMMUNITY): Payer: Self-pay | Admitting: Internal Medicine

## 2021-11-09 ENCOUNTER — Other Ambulatory Visit: Payer: Self-pay

## 2021-11-09 LAB — CBC WITH DIFFERENTIAL/PLATELET
Abs Immature Granulocytes: 0.02 10*3/uL (ref 0.00–0.07)
Basophils Absolute: 0 10*3/uL (ref 0.0–0.1)
Basophils Relative: 0 %
Eosinophils Absolute: 0.1 10*3/uL (ref 0.0–0.5)
Eosinophils Relative: 1 %
HCT: 38.8 % (ref 36.0–46.0)
Hemoglobin: 13.2 g/dL (ref 12.0–15.0)
Immature Granulocytes: 0 %
Lymphocytes Relative: 35 %
Lymphs Abs: 3.1 10*3/uL (ref 0.7–4.0)
MCH: 31.1 pg (ref 26.0–34.0)
MCHC: 34 g/dL (ref 30.0–36.0)
MCV: 91.3 fL (ref 80.0–100.0)
Monocytes Absolute: 0.8 10*3/uL (ref 0.1–1.0)
Monocytes Relative: 9 %
Neutro Abs: 5 10*3/uL (ref 1.7–7.7)
Neutrophils Relative %: 55 %
Platelets: 209 10*3/uL (ref 150–400)
RBC: 4.25 MIL/uL (ref 3.87–5.11)
RDW: 13 % (ref 11.5–15.5)
WBC: 9 10*3/uL (ref 4.0–10.5)
nRBC: 0 % (ref 0.0–0.2)

## 2021-11-09 NOTE — Progress Notes (Signed)
Occupational Therapy Evaluation Patient Details Name: Shelby Chan MRN: [id number] DOB: May 31, 1933 Today's Date: 11/09/2021   History of Present Illness Pt is a 88 y.o. female who presents to the orthopedic department with R hip pain after falling 2 weeks prior. X-rays revealed an impacted right subcapital hip fracture. Pt is s/p right hip screw placement for femoral neck fracture on 3/9.  No significant past medical history.   Clinical Impression   Pt pleasant and agreeable to session this date, dtr in room for session and actively participating in education. Pt at baseline was generally indep with ADLs and mobility. Strong family support. Currently pt presents with increased pain/discomfort, impairments in balance, safety and insight, and strength leading to increased level of A required for safe completion of self care transfers and ADLs. Pt requiring min-mod A for functional transfers, standing, and up to mod-max A for LB ADLs/toileting and bathing in both sitting and standing. Pt at this time is agreeable and motivated for continued acute skilled therapy services, and is most appropriate for d/c to post acute therapy at time of d/c. OT will continue to follow acutely with recommendations below.      Recommendations for follow up therapy are one component of a multi-disciplinary discharge planning process, led by the attending physician.  Recommendations may be updated based on patient status, additional functional criteria and insurance authorization.   Follow Up Recommendations  Skilled nursing-short term rehab (<3 hours/day)    Assistance Recommended at Discharge Frequent or constant Supervision/Assistance  Patient can return home with the following A lot of help with walking and/or transfers;A lot of help with bathing/dressing/bathroom;Assistance with cooking/housework;Direct supervision/assist for medications management;Assist for transportation;Help with stairs or ramp for  entrance    Functional Status Assessment  Patient has had a recent decline in their functional status and demonstrates the ability to make significant improvements in function in a reasonable and predictable amount of time.  Equipment Recommendations  Other (comment) (TBD at next level of care)    Recommendations for Other Services       Precautions / Restrictions Precautions Precautions: None Restrictions Weight Bearing Restrictions: Yes RLE Weight Bearing: Weight bearing as tolerated      Mobility Bed Mobility               General bed mobility comments: unable to assess as pt recieved sitting EOB for evaluation    Transfers Overall transfer level: Needs assistance Equipment used: Rolling walker (2 wheels) Transfers: Sit to/from Stand, Bed to chair/wheelchair/BSC Sit to Stand: Mod assist Stand pivot transfers: Min assist         General transfer comment: pt requiring cues for sequencing of RW and for hand placement for safety wtih transitions to and form standing at EOB and from recliner.      Balance   Sitting-balance support: Feet supported   Sitting balance - Comments: static standing assessed at EOB and standing at recliner, benefiting from WB and support through UE's on RW.                                   ADL either performed or assessed with clinical judgement   ADL Overall ADL's : Needs assistance/impaired Eating/Feeding: Set up;Sitting   Grooming: Sitting;Set up   Upper Body Bathing: Set up;Sitting   Lower Body Bathing: Moderate assistance;Sit to/from stand   Upper Body Dressing : Set up;Sitting   Lower Body Dressing: Moderate  assistance;Sit to/from stand;Sitting/lateral leans   Toilet Transfer: Minimal assistance;Rolling walker (2 wheels);Cueing for safety;Cueing for sequencing;Stand-pivot Toilet Transfer Details (indicate cue type and reason): simulated at EOB to recliner Toileting- Clothing Manipulation and Hygiene:  Moderate assistance;Maximal assistance;Sitting/lateral lean               Vision Baseline Vision/History: 0 No visual deficits Patient Visual Report: No change from baseline       Perception     Praxis      Pertinent Vitals/Pain Pain Assessment Pain Assessment: 0-10 Pain Score: 5  Pain Location: R hip Pain Descriptors / Indicators: Aching, Discomfort, Dull, Guarding Pain Intervention(s): Limited activity within patient's tolerance, Repositioned, Patient requesting pain meds-RN notified     Hand Dominance Right   Extremity/Trunk Assessment Upper Extremity Assessment Upper Extremity Assessment: Overall WFL for tasks assessed   Lower Extremity Assessment Lower Extremity Assessment: Defer to PT evaluation   Cervical / Trunk Assessment Cervical / Trunk Assessment: Normal   Communication Communication Communication: No difficulties   Cognition Arousal/Alertness: Awake/alert Behavior During Therapy: WFL for tasks assessed/performed Overall Cognitive Status: Within Functional Limits for tasks assessed                                 General Comments: dtr in room supportive and with good questions and carryover of education     General Comments  mild edema to R LE throughout    Exercises     Shoulder Instructions      Home Living Family/patient expects to be discharged to:: Private residence Living Arrangements: Alone Available Help at Discharge: Family Type of Home: House Home Access: Stairs to enter Secretary/administrator of Steps: 2 Entrance Stairs-Rails: None Home Layout: One level;Laundry or work area in basement     [employer] Shower/Tub: Chief Strategy Officer: Handicapped height     Home Equipment: Gilmer Mor - single point;Tub bench   Additional Comments: pt dtr stating shower seat may be tub transfer bench, family planning to provide S/A when discharged home form STR.      Prior Functioning/Environment Prior Level of  Function : Independent/Modified Independent             Mobility Comments: mod indep with SPC as needed ADLs Comments: generally indep with basic ADL's; dtr in town assists as needed. pt was indep wtih IADLs of driving/meds/bills.        OT Problem List: Decreased strength;Decreased activity tolerance;Impaired balance (sitting and/or standing);Decreased safety awareness;Decreased knowledge of use of DME or AE;Decreased knowledge of precautions      OT Treatment/Interventions: Self-care/ADL training;Therapeutic exercise;DME and/or AE instruction;Therapeutic activities;Patient/family education;Balance training    OT Goals(Current goals can be found in the care plan section) Acute Rehab OT Goals Patient Stated Goal: to be able to go home OT Goal Formulation: With patient Time For Goal Achievement: 11/23/21 Potential to Achieve Goals: Good ADL Goals Pt Will Perform Upper Body Dressing: with modified independence Pt Will Perform Lower Body Dressing: with modified independence;with adaptive equipment Pt Will Transfer to Toilet: with modified independence;stand pivot transfer Pt Will Perform Toileting - Clothing Manipulation and hygiene: with modified independence;sit to/from stand Pt Will Perform Tub/Shower Transfer: with supervision;Tub transfer;tub bench  OT Frequency: Min 2X/week    Co-evaluation              AM-PAC OT "6 Clicks" Daily Activity     Outcome Measure Help from another person eating meals?: None  Help from another person taking care of personal grooming?: A Little Help from another person toileting, which includes using toliet, bedpan, or urinal?: A Lot Help from another person bathing (including washing, rinsing, drying)?: A Lot Help from another person to put on and taking off regular upper body clothing?: A Little Help from another person to put on and taking off regular lower body clothing?: A Lot 6 Click Score: 16   End of Session Equipment Utilized  During Treatment: Gait belt;Rolling walker (2 wheels) Nurse Communication: Mobility status;Weight bearing status;Patient requests pain meds  Activity Tolerance: Patient tolerated treatment well Patient left: in chair;with call bell/phone within reach;with family/visitor present  OT Visit Diagnosis: Unsteadiness on feet (R26.81);Muscle weakness (generalized) (M62.81)                Time: 0826-0906 OT Time [id number] (min): 40 min Charges:  OT General Charges $OT Visit: 1 Visit OT Evaluation $OT Eval Moderate Complexity: 1 Mod OT Treatments $Self Care/Home Management : 23-37 mins  Kutter Schnepf OTR/L acute rehab services Office: [phone]   Wilhemena Durielly L Fallou Hulbert 11/09/2021, 9:32 AM

## 2021-11-09 NOTE — Progress Notes (Signed)
? ?  Subjective: ? ?Patient reports pain as mild. Confusion much improved. Worked with PT. Tolerating diet. ? ?Objective:  ? ?VITALS:   ?Vitals:  ? 11/08/21 0807 11/08/21 1546 11/08/21 2015 11/09/21 1761  ?BP: (!) 135/57 129/60 128/63 120/61  ?Pulse: 91 84 86 79  ?Resp: 18 18 14 16   ?Temp: 98 ?F (36.7 ?C) 98.2 ?F (36.8 ?C) 98.3 ?F (36.8 ?C) 98.8 ?F (37.1 ?C)  ?TempSrc: Oral Oral  Oral  ?SpO2: 95% 95% 95% 93%  ?Weight:      ?Height:      ? ? ?Right dressing is CDI. Spontaneously extends at right knee and dorsiflexes/plantar flexes right foot. 2+ DP pulse. ? ? ?Lab Results  ?Component Value Date  ? WBC 10.7 (H) 11/08/2021  ? HGB 12.8 11/08/2021  ? HCT 37.5 11/08/2021  ? MCV 90.8 11/08/2021  ? PLT 214 11/08/2021  ? ? ? ?Assessment/Plan: ? ?2 Days Post-Op s/p right hip screw placement for femoral neck fracture ? ?- Expected postop acute blood loss anemia - will monitor for symptoms ?- Patient to work with PT/OT to optimize mobilization safely ?- DVT ppx - SCDs, ambulation, Lovenox ?- Postoperative Abx: Ancef x 2 additional doses given ?- WBAT operative extremity ?- Pain control - multimodal pain management, ATC acetaminophen in conjunction with as needed narcotic (oxycodone), although this should be minimized with other modalities  ?- Discharge planning pending CM, appreciate coordination  ? ? ? 01/08/2022 ?11/09/2021, 7:48 AM ? ?

## 2021-11-09 NOTE — Progress Notes (Signed)
PROGRESS NOTE  Shelby BookbinderDarlene M Bieri  ZOX:[id number] DOB: 08/09/1933 DOA: 11/06/2021 PCP: Dorice Lamashivianathan, Susan S, MD   Brief Narrative: Shelby Chan is a 88 y.o. female without significant past medical history presents with complaints of right hip pain.  She thinks she slipped because of the rain outside and due to the new shoes she was wearing. Thereafter she started having mild aching pain in her right hip, but was able to get around without use of any assistive devices.  However, pain progressively worsening to the point which she is having difficulty putting any weight and needing assistance to try and stand.  She had followed up with orthopedics as an outpatient, x-rays revealed a impacted right subcapital hip fracture.  Orthopedics consulted here, s/p right hip screw placement for femoral neck fracture on 3/9.   PT/OT evaluation done, recommended skilled nursing facility on discharge.  Medically stable for discharge whenever possible.  Assessment & Plan:  Principal Problem:   Impacted fracture of right hip (HCC) secondary to fall Active Problems:   Fall   Assessment and Plan: * Impacted fracture of right hip (HCC) secondary to fall Patient presented with complaints of right hip pain over the last 2 weeks after having a mechanical fall.  [facility]  where x-rays noted an impacted right subcapital hip fracture. Remote history of fracture of the right leg. Orthopedics following s/p right hip screw placement for femoral neck fracture on 3/9. Continue  pain management, supportive care, bowel regimen. PT/OT evaluation done: Recommended skilled nursing facilty on discharge., DVT prophylaxis with lovenox.          DVT prophylaxis:enoxaparin (LOVENOX) injection 40 mg Start: 11/08/21 0000     Code Status: Full Code  Family Communication: Daughter at bedside  Patient status:Inpatient   Patient is from :Home  Anticipated discharge to:SNF  Estimated DC date:As soon as bed  is available   Consultants: Orthopedics  Procedures:ORIF  Antimicrobials:  Anti-infectives (From admission, onward)    Start     Dose/Rate Route Frequency Ordered Stop   11/07/21 2030  ceFAZolin (ANCEF) IVPB 2g/100 mL premix        2 g 200 mL/hr over 30 Minutes Intravenous Every 8 hours 11/07/21 1411 11/08/21 0551   11/07/21 1145  ceFAZolin (ANCEF) IVPB 2g/100 mL premix        2 g 200 mL/hr over 30 Minutes Intravenous On call to O.R. 11/07/21 1131 11/07/21 1235   11/07/21 1115  ceFAZolin (ANCEF) 2-4 GM/100ML-% IVPB       Note to Pharmacy: Shanda BumpsSerrano, Marissa M: cabinet override      11/07/21 1115 11/07/21 1231       Subjective:  Patient seen and examined at the bedside this morning.  Hemodynamically stable.  Comfortable without any complaints.  Pain well controlled   Objective: Vitals:   11/08/21 1546 11/08/21 2015 11/09/21 0621 11/09/21 0817  BP: 129/60 128/63 120/61 120/79  Pulse: 84 86 79 85  Resp: 18 14 16 15   Temp: 98.2 F (36.8 C) 98.3 F (36.8 C) 98.8 F (37.1 C) 98 F (36.7 C)  TempSrc: Oral  Oral Oral  SpO2: 95% 95% 93% 95%  Weight:      Height:        Intake/Output Summary (Last 24 hours) at 11/09/2021 1040 Last data filed at 11/09/2021 0915 Gross per 24 hour  Intake 360 ml  Output 1700 ml  Net -1340 ml   Filed Weights   11/06/21 1747  Weight: 72.6 kg  Examination:    General exam: Overall comfortable, not in distress, pleasant elderly female HEENT: PERRL Respiratory system:  no wheezes or crackles  Cardiovascular system: S1 & S2 heard, RRR.  Gastrointestinal system: Abdomen is nondistended, soft and nontender. Central nervous system: Alert and oriented Extremities: No edema, no clubbing ,no cyanosis, clean surgical wound on the right hip Skin: No rashes, no ulcers,no icterus     Data Reviewed: I have personally reviewed following labs and imaging studies  CBC: Recent Labs  Lab 11/06/21 1359 11/06/21 1408 11/08/21 0306 11/09/21 0759   WBC 5.8  --  10.7* 9.0  NEUTROABS 2.5  --   --  5.0  HGB 14.3 14.3 12.8 13.2  HCT 42.5 42.0 37.5 38.8  MCV 93.0  --  90.8 91.3  PLT 222  --  214 209   Basic Metabolic Panel: Recent Labs  Lab 11/06/21 1359 11/06/21 1408 11/07/21 0124  NA 139 142 141  K 3.6 3.9 4.0  CL 103 103 107  CO2 28  --  28  GLUCOSE 96 94 99  BUN 15 17 22   CREATININE 0.75 0.60 1.00  CALCIUM 9.7  --  9.3     Recent Results (from the past 240 hour(s))  Resp Panel by RT-PCR (Flu A&B, Covid) Nasopharyngeal Swab     Status: None   Collection Time: 11/06/21 11:35 AM   Specimen: Nasopharyngeal Swab; Nasopharyngeal(NP) swabs in vial transport medium  Result Value Ref Range Status   SARS Coronavirus 2 by RT PCR NEGATIVE NEGATIVE Final    Comment: (NOTE) SARS-CoV-2 target nucleic acids are NOT DETECTED.  The SARS-CoV-2 RNA is generally detectable in upper respiratory specimens during the acute phase of infection. The lowest concentration of SARS-CoV-2 viral copies this assay can detect is 138 copies/mL. A negative result does not preclude SARS-Cov-2 infection and should not be used as the sole basis for treatment or other patient management decisions. A negative result may occur with  improper specimen collection/handling, submission of specimen other than nasopharyngeal swab, presence of viral mutation(s) within the areas targeted by this assay, and inadequate number of viral copies(<138 copies/mL). A negative result must be combined with clinical observations, patient history, and epidemiological information. The expected result is Negative.  Fact Sheet for Patients:  01/06/22  Fact Sheet for Healthcare Providers:  BloggerCourse.com  This test is no t yet approved or cleared by the [employer]ious[employer]ker.it FDA and  has been authorized for detection and/or diagnosis of SARS-CoV-2 by FDA under an Emergency Use Authorization (EUA). This EUA will remain   in effect (meaning this test can be used) for the duration of the COVID-19 declaration under Section 564(b)(1) of the Act, 21 U.S.C.section 360bbb-3(b)(1), unless the authorization is terminated  or revoked sooner.       Influenza A by PCR NEGATIVE NEGATIVE Final   Influenza B by PCR NEGATIVE NEGATIVE Final    Comment: (NOTE) The Xpert Xpress SARS-CoV-2/FLU/RSV plus assay is intended as an aid in the diagnosis of influenza from Nasopharyngeal swab specimens and should not be used as a sole basis for treatment. Nasal washings and aspirates are unacceptable for Xpert Xpress SARS-CoV-2/FLU/RSV testing.  Fact Sheet for Patients: [place]  Fact Sheet for Healthcare Providers: BloggerCourse.com  This test is not yet approved or cleared by the SeriousBroker.it FDA and has been authorized for detection and/or diagnosis of SARS-CoV-2 by FDA under an Emergency Use Authorization (EUA). This EUA will remain in effect (meaning this test can be used) for the  duration of the COVID-19 declaration under Section 564(b)(1) of the Act, 21 U.S.C. section 360bbb-3(b)(1), unless the authorization is terminated or revoked.  Performed at [facility], [address], [place], [place] [address]   Surgical PCR screen     Status: None   Collection Time: 11/06/21 11:37 AM   Specimen: Nasal Mucosa; Nasal Swab  Result Value Ref Range Status   MRSA, PCR NEGATIVE NEGATIVE Final   Staphylococcus aureus NEGATIVE NEGATIVE Final    Comment: (NOTE) The Xpert SA Assay (FDA approved for NASAL specimens in patients 90 years of age and older), is one component of a comprehensive surveillance program. It is not intended to diagnose infection nor to guide or monitor treatment. Performed at [facility], [address], [place], [place] [address]      Radiology Studies: DG C-Arm 1-60 Min-No Report  Result Date: 11/07/2021 Fluoroscopy was  utilized by the requesting physician.  No radiographic interpretation.   DG HIP UNILAT WITH PELVIS 2-3 VIEWS RIGHT  Result Date: 11/07/2021 CLINICAL DATA:  Right hip screw fixation EXAM: DG HIP (WITH OR WITHOUT PELVIS) 2-3V RIGHT COMPARISON:  None. FINDINGS: Multiple C-arm images the right hip were obtained. 3 screws across the femoral neck into the femoral head. Screw position satisfactory. Presumably there is a right femoral neck fracture which is in normal alignment. IMPRESSION: Screw fixation right femoral neck. Electronically Signed   By: Marlan Palau M.D.   On: 11/07/2021 13:11    Scheduled Meds:  enoxaparin (LOVENOX) injection  40 mg Subcutaneous Q24H   fentaNYL (SUBLIMAZE) injection  50 mcg Intravenous Once   loratadine  10 mg Oral Daily   senna  1 tablet Oral BID   Continuous Infusions:   LOS: 3 days   Burnadette Pop, MD [facility] P3/07/2022, 10:40 AM

## 2021-11-09 NOTE — TOC Initial Note (Signed)
Transition of Care (TOC) - Initial/Assessment Note  ? ? ?Patient Details  ?Name: Shelby Chan ?MRN: [id number] ?Date of Birth: 06-May-1933 ? ?Transition of Care (TOC) CM/SW Contact:    ?Carolin Sicks, LCSWA ?Phone Number: ?11/09/2021, 4:00 PM ? ?Clinical Narrative:                 ?CSW spoke with pt at bedside and daughters by cellphone. Pt and daughters are very pleasant. Pt lives by herself.  Pt's house is one level with a basement.  Pt also reported that she has a chairlift to go to the basement.  Pt has received covid vaccines and booster from [facility].  Pt is agreeable for possible SNF placement if affordable.  Pt's daughters prefers for CSW to send to the following SNF's:  [facility] explained that the pt will need more choices and to consult https://hill.biz/.  Pt's daughter will review additional choices.  TOC will continue to assist with disposition planning. ? ?Expected Discharge Plan: [place] ?Barriers to Discharge: Ship broker, Continued Medical Work up, SNF Pending bed offer ? ? ?Patient Goals and CMS Choice ?Patient states their goals for this hospitalization and ongoing recovery are:: To Walk ?CMS Medicare.gov Compare Post Acute Care list provided to:: Patient ?Choice offered to / list presented to : Patient ? ?Expected Discharge Plan and Services ?Expected Discharge Plan: [place] ?  ?  ?  ?Living arrangements for the past 2 months: [place] ?                ?  ?  ?  ?  ?  ?  ?  ?  ?  ?  ? ?Prior Living Arrangements/Services ?Living arrangements for the past 2 months: [place] ?Lives with:: Self ?Patient language and need for interpreter reviewed:: Yes ?Do you feel safe going back to the place where you live?: Yes      ?Need for Family Participation in Patient Care: Yes (Comment) ?Care giver support system in place?: Yes (comment) ?  ?Criminal Activity/Legal Involvement Pertinent to Current  Situation/Hospitalization: No - Comment as needed ? ?Activities of Daily Living ?Home Assistive Devices/Equipment: None ?ADL Screening (condition at time of admission) ?Patient's cognitive ability adequate to safely complete daily activities?: Yes ?Is the patient deaf or have difficulty hearing?: No ?Does the patient have difficulty seeing, even when wearing glasses/contacts?: No ?Does the patient have difficulty concentrating, remembering, or making decisions?: No ?Patient able to express need for assistance with ADLs?: Yes ?Does the patient have difficulty dressing or bathing?: No ?Independently performs ADLs?: Yes (appropriate for developmental age) ?Does the patient have difficulty walking or climbing stairs?: No ?Weakness of Legs: Right ?Weakness of Arms/Hands: None ? ?Permission Sought/Granted ?Permission sought to share information with : Case Manager, Customer service manager, Family Supports ?Permission granted to share information with : Yes, Verbal Permission Granted ? Share Information with NAME: Birdie Hopes & Nunzio Cory Stratton ? Permission granted to share info w AGENCY: SNF ? Permission granted to share info w Relationship: Daughters ? Permission granted to share info w Contact Information: Yes ? ?Emotional Assessment ?Appearance:: Appears stated age ?Attitude/Demeanor/Rapport: Engaged, Gracious ?Affect (typically observed): Appropriate ?Orientation: : Oriented to Self, Oriented to Place, Oriented to  Time, Oriented to Situation ?Alcohol / Substance Use: Not Applicable ?Psych Involvement: No (comment) ? ?Admission diagnosis:  Hip fracture ([place]) [S72.009A] ?Impacted fracture of right hip ([place]) [S72.091A] ?Patient Active Problem List  ?  Diagnosis Date Noted  ? Closed right hip fracture ([place]) 11/06/2021  ? Impacted fracture of right hip ([place]) secondary to fall 11/06/2021  ? Fall 11/06/2021  ? Pain in left ankle and joints of left foot 06/12/2021  ? Pain in left leg 04/13/2019  ? ?PCP:   Donalynn Furlong, MD ?Pharmacy:   ?[facility] [id number] [place] - [address] AT [address] ?[place] ?[place] [address] ?Phone: [phone] Fax: [phone] ? ? ? ? ?Social Determinants of Health (SDOH) Interventions ?  ? ?Readmission Risk Interventions ?No flowsheet data found. ? ? ?

## 2021-11-10 MED ORDER — HYDROCODONE-ACETAMINOPHEN 5-325 MG PO TABS: 1.0000 | ORAL_TABLET | Freq: Four times a day (QID) | ORAL | 0 refills | Status: DC | PRN

## 2021-11-10 MED ORDER — ENOXAPARIN SODIUM 40 MG/0.4ML IJ SOSY: 40.0000 mg | PREFILLED_SYRINGE | INTRAMUSCULAR | 0 refills | Status: DC

## 2021-11-10 MED ORDER — POLYETHYLENE GLYCOL 3350 17 G PO PACK: 17.0000 g | PACK | Freq: Every day | ORAL | 0 refills | Status: DC

## 2021-11-10 MED ORDER — SENNA 8.6 MG PO TABS
1.0000 | ORAL_TABLET | Freq: Two times a day (BID) | ORAL | 0 refills | Status: DC
Start: 2021-11-10 — End: 2021-11-13

## 2021-11-10 MED ORDER — POLYETHYLENE GLYCOL 3350 17 G PO PACK
17.0000 g | PACK | Freq: Every day | ORAL | Status: DC
  Administered 2021-11-10: 17 g via ORAL
  Filled 2021-11-10 (×3): qty 1

## 2021-11-10 NOTE — [place] FL2 (Signed)
?[place] MEDICAID FL2 LEVEL OF CARE SCREENING TOOL  ?  ? ?IDENTIFICATION  ?Patient Name: ?Shelby Chan Birthdate: 1933/06/30 Sex: female Admission Date (Current Location): ?11/06/2021  ?[place] Number: ? [place] ?  Facility and Address:  ?[facility], [address], [place], [place] [address] ?     Provider Number: ?[id number]  ?Attending Physician Name and Address:  ?Burnadette Pop, MD ? Relative Name and Phone Number:  ?Elsie Amis (Daughter)   [phone] ?   ?Current Level of Care: ?Hospital Recommended Level of Care: ?Skilled Nursing Facility Prior Approval Number: ?  ? ?Date Approved/Denied: ?  PASRR Number: ?Pending ? ?Discharge Plan: ?SNF ?  ? ?Current Diagnoses: ?Patient Active Problem List  ? Diagnosis Date Noted  ? Closed right hip fracture (HCC) 11/06/2021  ? Impacted fracture of right hip (HCC) secondary to fall 11/06/2021  ? Fall 11/06/2021  ? Pain in left ankle and joints of left foot 06/12/2021  ? Pain in left leg 04/13/2019  ? ? ?Orientation RESPIRATION BLADDER Height & Weight   ?  ?Self, Time, Situation, Place ? Normal Incontinent, External catheter Weight: 160 lb (72.6 kg) ?Height:  5\' 5"  (165.1 cm)  ?BEHAVIORAL SYMPTOMS/MOOD NEUROLOGICAL BOWEL NUTRITION STATUS  ?    Continent Diet (See DC Summary)  ?AMBULATORY STATUS COMMUNICATION OF NEEDS Skin   ?Extensive Assist Verbally Normal ?  ?  ?  ?    ?     ?     ? ? ?Personal Care Assistance Level of Assistance  ?Bathing, Feeding, Dressing Bathing Assistance: Maximum assistance ?Feeding assistance: Independent ?Dressing Assistance: Maximum assistance ?   ? ?Functional Limitations Info  ?Sight, Hearing, Speech Sight Info: Impaired ?Hearing Info: Adequate ?Speech Info: Adequate  ? ? ?SPECIAL CARE FACTORS FREQUENCY  ?PT (By licensed PT), OT (By licensed OT)   ?  ?PT Frequency: 5x a week ?OT Frequency: 5x a week ?  ?  ?  ?   ? ? ?Contractures Contractures Info: Not present  ? ? ?Additional Factors Info  ?Code  Status, Allergies Code Status Info: Full ?Allergies Info: NKA ?  ?  ?  ?   ? ?Current Medications (11/10/2021):  This is the current hospital active medication list ?Current Facility-Administered Medications  ?Medication Dose Route Frequency Provider Last Rate Last Admin  ? acetaminophen (TYLENOL) tablet 650 mg  650 mg Oral Q6H PRN 01/10/2022, MD   650 mg at 11/10/21 1000  ? enoxaparin (LOVENOX) injection 40 mg  40 mg Subcutaneous Q24H 01/10/22, MD   40 mg at 11/09/21 2323  ? fentaNYL (SUBLIMAZE) injection 50 mcg  50 mcg Intravenous Once 2324, MD      ? HYDROcodone-acetaminophen (NORCO/VICODIN) 5-325 MG per tablet 1-2 tablet  1-2 tablet Oral Q6H PRN [name], MD      ? loratadine (CLARITIN) tablet 10 mg  10 mg Oral Daily [name], MD   10 mg at 11/10/21 1000  ? morphine (PF) 2 MG/ML injection 1 mg  1 mg Intravenous Q2H PRN 01/10/22, MD      ? ondansetron (ZOFRAN) injection 4 mg  4 mg Intravenous Q6H PRN Burnadette Pop, MD   4 mg at 11/07/21 1455  ? polyethylene glycol (MIRALAX / GLYCOLAX) packet 17 g  17 g Oral Daily 01/07/22, MD   17 g at 11/10/21 1000  ? prochlorperazine (COMPAZINE) injection 10 mg  10 mg Intravenous Q6H PRN 01/10/22, MD   10 mg at  11/07/21 1551  ? senna (SENOKOT) tablet 8.6 mg  1 tablet Oral BID [name], MD   8.6 mg at 11/10/21 1000  ? ? ? ?Discharge Medications: ?Please see discharge summary for a list of discharge medications. ? ?Relevant Imaging Results: ? ?Relevant Lab Results: ? ? ?Additional Information ?SSN: [id number] ? ?Marcellino Fidalgo Wynn Banker, LCSWA ? ? ? ? ?

## 2021-11-10 NOTE — Discharge Summary (Addendum)
Physician Discharge Summary  Shelby Chan [id number] DOB: [birth year] DOA: 11/06/2021  PCP: Donalynn Furlong, MD  Admit date: 11/06/2021 Discharge date: 11/11/2021  Admitted From: Home Disposition:  SNF  Discharge Condition:Stable CODE STATUS:FULL Diet recommendation:  Regular    Brief/Interim Summary:  Shelby Chan is a 88 y.o. female without significant past medical history presents with complaints of right hip pain.  She thinks she slipped because of the rain outside and due to the new shoes she was wearing. Thereafter she started having mild aching pain in her right hip, but was able to get around without use of any assistive devices.  However, pain progressively worsening to the point which she is having difficulty putting any weight and needing assistance to try and stand.  She had followed up with orthopedics as an outpatient, x-rays revealed a impacted right subcapital hip fracture.  Orthopedics consulted here, s/p right hip screw placement for femoral neck fracture on 3/9.   PT/OT evaluation done, recommended skilled nursing facility on discharge.  Medically stable for discharge whenever possible.  Following problems were addressed during her hospitalization:  Impacted fracture of right hip ([place]) secondary to fall Patient presented with complaints of right hip pain over the last 2 weeks after having a mechanical fall.  Seen in [facility] clinic  where x-rays noted an impacted right subcapital hip fracture. Remote history of fracture of the right leg. Orthopedics were following ,s/p right hip screw placement for femoral neck fracture on 3/9. Continue  pain management, supportive care, bowel regimen. PT/OT evaluation done: Recommended skilled nursing facilty on discharge., DVT prophylaxis with lovenox. F/U with orthopedics in 2 weeks for x-rays and suture removal    Discharge Diagnoses:  Principal Problem:   Impacted fracture of right hip ([place]) secondary to fall Active  Problems:   Fall    Discharge Instructions  Discharge Instructions     Diet general   Complete by: As directed    Discharge instructions   Complete by: As directed    1)Please take prescribed medications as instructed 2)Follow up with orthopedics in 2 weeks.  Name and number the provider has been attached   Increase activity slowly   Complete by: As directed    No wound care   Complete by: As directed       Allergies as of 11/11/2021   No Known Allergies      Medication List     STOP taking these medications    omeprazole 20 MG capsule Commonly known as: PRILOSEC       TAKE these medications    acetaminophen 500 MG tablet Commonly known as: TYLENOL Take 1,000 mg by mouth every 6 (six) hours as needed for moderate pain.   aspirin EC 81 MG tablet Take 81 mg by mouth daily.   Coenzyme Q10 50 MG Caps Take 100 mg by mouth daily.   enoxaparin 40 MG/0.4ML injection Commonly known as: LOVENOX Inject 0.4 mLs (40 mg total) into the skin daily for 14 days.   HYDROcodone-acetaminophen 5-325 MG tablet Commonly known as: NORCO/VICODIN Take 1 tablet by mouth every 6 (six) hours as needed for moderate pain.   loratadine 10 MG tablet Commonly known as: CLARITIN Take 10 mg by mouth daily.   multivitamin with minerals Tabs tablet Take 1 tablet by mouth daily.   polyethylene glycol 17 g packet Commonly known as: MIRALAX / GLYCOLAX Take 17 g by mouth daily.   senna 8.6 MG Tabs tablet Commonly known as: SENOKOT Take  1 tablet (8.6 mg total) by mouth 2 (two) times daily.   Vitamin D (Ergocalciferol) 1.25 MG (50000 UNIT) Caps capsule Commonly known as: DRISDOL Take 50,000 Units by mouth once a week.        Follow-up Information     Vanetta Mulders, MD Follow up.   Specialty: Orthopedic Surgery Contact information: [address] [place] [address] [phone]                No Known  Allergies  Consultations: Orthopedics   Procedures/Studies: DG CHEST PORT 1 VIEW  Result Date: 11/11/2021 CLINICAL DATA:  Tuberculosis.  Fall.  Hip pain. EXAM: PORTABLE CHEST 1 VIEW COMPARISON:  11/06/2021 FINDINGS: Poor inspiration. Heart size upper limits of normal. Aortic atherosclerosis is present. The lungs are clear allowing for the poor inspiration. No evidence regional rib fracture. IMPRESSION: Poor inspiration. No active disease suspected. Aortic atherosclerosis. Electronically Signed   By: Nelson Chimes M.D.   On: 11/11/2021 11:11   DG Chest Port 1 View  Result Date: 11/06/2021 CLINICAL DATA:  Preop evaluation EXAM: PORTABLE CHEST 1 VIEW COMPARISON:  None. FINDINGS: The heart size and mediastinal contours are within normal limits. Both lungs are clear. The visualized skeletal structures are unremarkable. IMPRESSION: No active disease. Electronically Signed   By: Elmer Picker M.D.   On: 11/06/2021 12:59   DG C-Arm 1-60 Min-No Report  Result Date: 11/07/2021 Fluoroscopy was utilized by the requesting physician.  No radiographic interpretation.   DG HIP UNILAT WITH PELVIS 2-3 VIEWS RIGHT  Result Date: 11/07/2021 CLINICAL DATA:  Right hip screw fixation EXAM: DG HIP (WITH OR WITHOUT PELVIS) 2-3V RIGHT COMPARISON:  None. FINDINGS: Multiple C-arm images the right hip were obtained. 3 screws across the femoral neck into the femoral head. Screw position satisfactory. Presumably there is a right femoral neck fracture which is in normal alignment. IMPRESSION: Screw fixation right femoral neck. Electronically Signed   By: Franchot Gallo M.D.   On: 11/07/2021 13:11   XR HIP UNILAT W OR W/O PELVIS 2-3 VIEWS RIGHT  Result Date: 11/06/2021 Radiographs AP pelvis of the right hip demonstrate impacted subcapital hip fracture of the right hip.  Femoral head is reduced in the acetabulum no other apparent osseous findings  DG Outside Films Extremity  Result Date: 11/06/2021 This examination  belongs to an outside facility and is stored here for comparison purposes only.  Contact the originating outside institution for any associated report or interpretation.     Subjective: Patient seen and examined at the bedside this morning.  Hemodynamically stable for discharge whenever possible.    Discharge Exam: Vitals:   11/10/21 2023 11/11/21 0700  BP: (!) 103/58 120/67  Pulse: 85 78  Resp: 14 16  Temp: 98 F (36.7 C) 97.6 F (36.4 C)  SpO2: 95% 96%   Vitals:   11/10/21 0736 11/10/21 1329 11/10/21 2023 11/11/21 0700  BP: 126/68 (!) 136/59 (!) 103/58 120/67  Pulse: 78 88 85 78  Resp: 16  14 16   Temp: 98.7 F (37.1 C) 97.8 F (36.6 C) 98 F (36.7 C) 97.6 F (36.4 C)  TempSrc: Oral Oral  Oral  SpO2: 93% 100% 95% 96%  Weight:      Height:        General: Pt is alert, awake, not in acute distress Cardiovascular: RRR, S1/S2 +, no rubs, no gallops Respiratory: CTA bilaterally, no wheezing, no rhonchi Abdominal: Soft, NT, ND, bowel sounds + Extremities: no edema, no cyanosis  The results of significant diagnostics from this hospitalization (including imaging, microbiology, ancillary and laboratory) are listed below for reference.     Microbiology: Recent Results (from the past 240 hour(s))  Resp Panel by RT-PCR (Flu A&B, Covid) Nasopharyngeal Swab     Status: None   Collection Time: 11/06/21 11:35 AM   Specimen: Nasopharyngeal Swab; Nasopharyngeal(NP) swabs in vial transport medium  Result Value Ref Range Status   SARS Coronavirus 2 by RT PCR NEGATIVE NEGATIVE Final    Comment: (NOTE) SARS-CoV-2 target nucleic acids are NOT DETECTED.  The SARS-CoV-2 RNA is generally detectable in upper respiratory specimens during the acute phase of infection. The lowest concentration of SARS-CoV-2 viral copies this assay can detect is 138 copies/mL. A negative result does not preclude SARS-Cov-2 infection and should not be used as the sole basis for treatment or other  patient management decisions. A negative result may occur with  improper specimen collection/handling, submission of specimen other than nasopharyngeal swab, presence of viral mutation(s) within the areas targeted by this assay, and inadequate number of viral copies(<138 copies/mL). A negative result must be combined with clinical observations, patient history, and epidemiological information. The expected result is Negative.  Fact Sheet for Patients:  EntrepreneurPulse.com.au  Fact Sheet for Healthcare Providers:  IncredibleEmployment.be  This test is no t yet approved or cleared by the [place] FDA and  has been authorized for detection and/or diagnosis of SARS-CoV-2 by FDA under an Emergency Use Authorization (EUA). This EUA will remain  in effect (meaning this test can be used) for the duration of the COVID-19 declaration under Section 564(b)(1) of the Act, 21 U.S.C.section 360bbb-3(b)(1), unless the authorization is terminated  or revoked sooner.       Influenza A by PCR NEGATIVE NEGATIVE Final   Influenza B by PCR NEGATIVE NEGATIVE Final    Comment: (NOTE) The Xpert Xpress SARS-CoV-2/FLU/RSV plus assay is intended as an aid in the diagnosis of influenza from Nasopharyngeal swab specimens and should not be used as a sole basis for treatment. Nasal washings and aspirates are unacceptable for Xpert Xpress SARS-CoV-2/FLU/RSV testing.  Fact Sheet for Patients: EntrepreneurPulse.com.au  Fact Sheet for Healthcare Providers: IncredibleEmployment.be  This test is not yet approved or cleared by the [place] FDA and has been authorized for detection and/or diagnosis of SARS-CoV-2 by FDA under an Emergency Use Authorization (EUA). This EUA will remain in effect (meaning this test can be used) for the duration of the COVID-19 declaration under Section 564(b)(1) of the Act, 21 U.S.C. section  360bbb-3(b)(1), unless the authorization is terminated or revoked.  Performed at [facility], [place] [address] [place], [place] [address]   Surgical PCR screen     Status: None   Collection Time: 11/06/21 11:37 AM   Specimen: Nasal Mucosa; Nasal Swab  Result Value Ref Range Status   MRSA, PCR NEGATIVE NEGATIVE Final   Staphylococcus aureus NEGATIVE NEGATIVE Final    Comment: (NOTE) The Xpert SA Assay (FDA approved for NASAL specimens in patients 25 years of age and older), is one component of a comprehensive surveillance program. It is not intended to diagnose infection nor to guide or monitor treatment. Performed at [facility], [place] [address] [place], [place] [address]      Labs: BNP (last 3 results) No results for input(s): BNP in the last 8760 hours. Basic Metabolic Panel: Recent Labs  Lab 11/06/21 1359 11/06/21 1408 11/07/21 0124  NA 139 142 141  K 3.6 3.9 4.0  CL 103 103 107  CO2 28  --  28  GLUCOSE 96 94 99  BUN 15 17 22   CREATININE 0.75 0.60 1.00  CALCIUM 9.7  --  9.3   Liver Function Tests: No results for input(s): AST, ALT, ALKPHOS, BILITOT, PROT, ALBUMIN in the last 168 hours. No results for input(s): LIPASE, AMYLASE in the last 168 hours. No results for input(s): AMMONIA in the last 168 hours. CBC: Recent Labs  Lab 11/06/21 1359 11/06/21 1408 11/08/21 0306 11/09/21 0759  WBC 5.8  --  10.7* 9.0  NEUTROABS 2.5  --   --  5.0  HGB 14.3 14.3 12.8 13.2  HCT 42.5 42.0 37.5 38.8  MCV 93.0  --  90.8 91.3  PLT 222  --  214 209   Cardiac Enzymes: No results for input(s): CKTOTAL, CKMB, CKMBINDEX, TROPONINI in the last 168 hours. BNP: Invalid input(s): POCBNP CBG: No results for input(s): GLUCAP in the last 168 hours. D-Dimer No results for input(s): DDIMER in the last 72 hours. Hgb A1c No results for input(s): HGBA1C in the last 72 hours. Lipid Profile No results for input(s): CHOL, HDL, LDLCALC, TRIG, CHOLHDL, LDLDIRECT in the last  72 hours. Thyroid function studies No results for input(s): TSH, T4TOTAL, T3FREE, THYROIDAB in the last 72 hours.  Invalid input(s): FREET3 Anemia work up No results for input(s): VITAMINB12, FOLATE, FERRITIN, TIBC, IRON, RETICCTPCT in the last 72 hours. Urinalysis No results found for: COLORURINE, APPEARANCEUR, [place], [place], GLUCOSEU, [place], [place], [place], PROTEINUR, UROBILINOGEN, NITRITE, LEUKOCYTESUR Sepsis Labs Invalid input(s): PROCALCITONIN,  WBC,  LACTICIDVEN Microbiology Recent Results (from the past 240 hour(s))  Resp Panel by RT-PCR (Flu A&B, Covid) Nasopharyngeal Swab     Status: None   Collection Time: 11/06/21 11:35 AM   Specimen: Nasopharyngeal Swab; Nasopharyngeal(NP) swabs in vial transport medium  Result Value Ref Range Status   SARS Coronavirus 2 by RT PCR NEGATIVE NEGATIVE Final    Comment: (NOTE) SARS-CoV-2 target nucleic acids are NOT DETECTED.  The SARS-CoV-2 RNA is generally detectable in upper respiratory specimens during the acute phase of infection. The lowest concentration of SARS-CoV-2 viral copies this assay can detect is 138 copies/mL. A negative result does not preclude SARS-Cov-2 infection and should not be used as the sole basis for treatment or other patient management decisions. A negative result may occur with  improper specimen collection/handling, submission of specimen other than nasopharyngeal swab, presence of viral mutation(s) within the areas targeted by this assay, and inadequate number of viral copies(<138 copies/mL). A negative result must be combined with clinical observations, patient history, and epidemiological information. The expected result is Negative.  Fact Sheet for Patients:  EntrepreneurPulse.com.au  Fact Sheet for Healthcare Providers:  IncredibleEmployment.be  This test is no t yet approved or cleared by the [place] FDA and  has been authorized for detection  and/or diagnosis of SARS-CoV-2 by FDA under an Emergency Use Authorization (EUA). This EUA will remain  in effect (meaning this test can be used) for the duration of the COVID-19 declaration under Section 564(b)(1) of the Act, 21 U.S.C.section 360bbb-3(b)(1), unless the authorization is terminated  or revoked sooner.       Influenza A by PCR NEGATIVE NEGATIVE Final   Influenza B by PCR NEGATIVE NEGATIVE Final    Comment: (NOTE) The Xpert Xpress SARS-CoV-2/FLU/RSV plus assay is intended as an aid in the diagnosis of influenza from Nasopharyngeal swab specimens and should not be used as a sole basis for treatment. Nasal washings and aspirates  are unacceptable for Xpert Xpress SARS-CoV-2/FLU/RSV testing.  Fact Sheet for Patients: EntrepreneurPulse.com.au  Fact Sheet for Healthcare Providers: IncredibleEmployment.be  This test is not yet approved or cleared by the [place] FDA and has been authorized for detection and/or diagnosis of SARS-CoV-2 by FDA under an Emergency Use Authorization (EUA). This EUA will remain in effect (meaning this test can be used) for the duration of the COVID-19 declaration under Section 564(b)(1) of the Act, 21 U.S.C. section 360bbb-3(b)(1), unless the authorization is terminated or revoked.  Performed at [facility], [place] [address] [place], [place] [address]   Surgical PCR screen     Status: None   Collection Time: 11/06/21 11:37 AM   Specimen: Nasal Mucosa; Nasal Swab  Result Value Ref Range Status   MRSA, PCR NEGATIVE NEGATIVE Final   Staphylococcus aureus NEGATIVE NEGATIVE Final    Comment: (NOTE) The Xpert SA Assay (FDA approved for NASAL specimens in patients 23 years of age and older), is one component of a comprehensive surveillance program. It is not intended to diagnose infection nor to guide or monitor treatment. Performed at [facility], [place] [address], [place],  [place] [address]     Please note: You were cared for by a hospitalist during your hospital stay. Once you are discharged, your primary care physician will handle any further medical issues. Please note that NO REFILLS for any discharge medications will be authorized once you are discharged, as it is imperative that you return to your primary care physician (or establish a relationship with a primary care physician if you do not have one) for your post hospital discharge needs so that they can reassess your need for medications and monitor your lab values.    Time coordinating discharge: 40 minutes  SIGNED:   Shelly Coss, MD  [facility] 11/11/2021, 1:45 PM Pager [phone]  If 7PM-7AM, please contact night-coverage [email or web address] Password [email or web address]

## 2021-11-10 NOTE — Progress Notes (Signed)
? ?  Subjective: ? ?Patient reports pain as mild.  Worked with therapy.  Likely recommending placement to SNF.  Tolerating diet.  Voiding.  No BM but denies nausea. ?Objective:  ? ?VITALS:   ?Vitals:  ? 11/09/21 0817 11/09/21 1928 11/10/21 0427 11/10/21 0736  ?BP: 120/79 133/80 (!) 119/59 126/68  ?Pulse: 85 81 74 78  ?Resp: 15 15 18 16   ?Temp: 98 ?F (36.7 ?C) 98.2 ?F (36.8 ?C) 98.5 ?F (36.9 ?C) 98.7 ?F (37.1 ?C)  ?TempSrc: Oral Oral Oral Oral  ?SpO2: 95% 98% 94% 93%  ?Weight:      ?Height:      ? ? ?Right dressing is CDI. Spontaneously extends at right knee and dorsiflexes/plantar flexes right foot. 2+ DP pulse. ? ? ?Lab Results  ?Component Value Date  ? WBC 9.0 11/09/2021  ? HGB 13.2 11/09/2021  ? HCT 38.8 11/09/2021  ? MCV 91.3 11/09/2021  ? PLT 209 11/09/2021  ? ? ? ?Assessment/Plan: ? ?3 Days Post-Op s/p right hip screw placement for femoral neck fracture ? ?- Expected postop acute blood loss anemia - will monitor for symptoms ?- Patient to work with PT/OT to optimize mobilization safely ?- DVT ppx - SCDs, ambulation, Lovenox ?- WBAT operative extremity ?- Pain control - multimodal pain management, ATC acetaminophen in conjunction with as needed narcotic (oxycodone), although this should be minimized with other modalities  ?- Discharge planning pending CM, appreciate coordination  ? ? ? 01/09/2022 ?11/10/2021, 7:43 AM ? ?

## 2021-11-11 ENCOUNTER — Inpatient Hospital Stay (HOSPITAL_COMMUNITY): Payer: Medicare Other

## 2021-11-11 NOTE — Care Management Important Message (Signed)
Important Message ? ?Patient Details  ?Name: Shelby Chan ?MRN: [id number] ?Date of Birth: Aug 08, 1933 ? ? ?Medicare Important Message Given:  Yes ? ? ? ? ?Marylene Land  Andrius Andrepont-Martin ?11/11/2021, 2:00 PM ?

## 2021-11-11 NOTE — Progress Notes (Signed)
?  RE:  Shelby Chan       ?Date of Birth: 05/19/1933      ?Date:   11/11/21     ? ? ?To Whom It May Concern: ? ?Please be advised that the above-named patient will require a short-term nursing home stay - anticipated 30 days or less for rehabilitation and strengthening.  The plan is for return home. ? ? ?              ?MD signature ? ?              ?Date ?

## 2021-11-11 NOTE — TOC Progression Note (Addendum)
Transition of Care (TOC) - Progression Note  ? ? ?Patient Details  ?Name: Shelby Chan ?MRN: [id number] ?Date of Birth: 02-24-1933 ? ?Transition of Care (TOC) CM/SW Contact  ?Lorri Frederick, LCSW ?Phone Number: ?11/11/2021, 2:13 PM ? ?Clinical Narrative:    ?CSW spoke with pt daughter Rosalita Chessman, and she is requesting admission to the [facility], which is ALF facility.  Pt daughter was present talking to the admissions person, Deanna Artis, at the [facility], CSW spoke with Vita Barley, who said they can admit pt as respite resident and provide PT/OT.  She asked for information to be faxed to [phone], which was done. ? ?CSW spoke with Rosalita Chessman to make sure she understood that ALF is not SNF, discussed that the [facility] medicare would cover the full cost of SNF, not ALF.  She is aware, would like the [facility]. ? ?1250: CSW received message from daughter and Deanna Artis that the [facility] cannot accept pt after all.  Daughter now willing to accept bed offer at [facility].  CSW confirmed with Lynnea Ferrier at [facility] that they can accept pt.   ? ?Level 2 passr docs upladed in [place].  CSW attempted to initiate auth in [place], but [place] does not manage [employer].  CSW spoke with Lynnea Ferrier and she will attempt to initiate auth with [facility] Medicare/[place].  ? ?1530: passr approved: [id number] A ? ?Expected Discharge Plan: Skilled Nursing Facility ?Barriers to Discharge: English as a second language teacher, Continued Medical Work up, SNF Pending bed offer ? ?Expected Discharge Plan and Services ?Expected Discharge Plan: Skilled Nursing Facility ?  ?  ?  ?Living arrangements for the past 2 months: Single Family Home ?Expected Discharge Date: 11/10/21               ?  ?  ?  ?  ?  ?  ?  ?  ?  ?  ? ? ?Social Determinants of Health (SDOH) Interventions ?  ? ?Readmission Risk Interventions ?No flowsheet data found. ? ?

## 2021-11-11 NOTE — Progress Notes (Signed)
Patient seen and examined at bedside this morning.  Hemodynamically stable for discharge whenever possible to skilled nursing facility.  No new change to medical management.  Discharge orders and summary are in place. ?

## 2021-11-11 NOTE — Progress Notes (Signed)
Occupational Therapy Treatment ?Patient Details ?Name: Shelby Chan ?MRN: [id number] ?DOB: [birth year] ?Today's Date: 11/11/2021 ? ? ?History of present illness Pt is a 88 y.o. female who presents to the orthopedic department with R hip pain after falling 2 weeks prior. X-rays revealed an impacted right subcapital hip fracture. Pt is s/p right hip screw placement for femoral neck fracture on 3/9.  No significant past medical history. ?  ?OT comments ? Pt making good progress towards goals this session, completes ADLS at a min guard level, mod A for LB ADLs due to decreased ROM/pain. Pt supervision for bed mobility and min guard for transfers with RW. Pt demonstrates good technique, recalling hand placement on RW and pushing from bed from previous session. Pt able to ambulate in room/hallway with min guard A, able to stand statically and turn to L/R  without LOB or RW support. Although AMPAC indicative of home, continue to recommend SNF at d/c due to pt living alone, and decreased family support at d/c.  ? ?Recommendations for follow up therapy are one component of a multi-disciplinary discharge planning process, led by the attending physician.  Recommendations may be updated based on patient status, additional functional criteria and insurance authorization. ?   ?Follow Up Recommendations ? Skilled nursing-short term rehab (<3 hours/day)  ?  ?Assistance Recommended at Discharge Set up Supervision/Assistance  ?Patient can return home with the following ? A little help with walking and/or transfers;A little help with bathing/dressing/bathroom;Assistance with cooking/housework;Assist for transportation;Help with stairs or ramp for entrance ?  ?Equipment Recommendations ? None recommended by OT;Other (comment) (deferred to next level of care)  ?  ?Recommendations for Other Services   ? ?  ?Precautions / Restrictions Precautions ?Precautions: None ?Restrictions ?Weight Bearing Restrictions: Yes ?RLE Weight Bearing:  Weight bearing as tolerated  ? ? ?  ? ?Mobility Bed Mobility ?Overal bed mobility: Needs Assistance ?Bed Mobility: Supine to Sit ?  ?  ?Supine to sit: Supervision ?  ?  ?General bed mobility comments: increased time ?  ? ?Transfers ?Overall transfer level: Needs assistance ?Equipment used: Rolling walker (2 wheels) ?Transfers: Sit to/from Stand ?Sit to Stand: Min guard ?  ?  ?  ?  ?  ?General transfer comment: pt remembers to place 1 hand on RW/ 1 hand on bed for transfer ?  ?  ?Balance Overall balance assessment: Needs assistance ?Sitting-balance support: Feet supported ?Sitting balance-Leahy Scale: Good ?Sitting balance - Comments: pt able to reach outside BOS to pull up sock without LOB ?  ?  ?Standing balance-Leahy Scale: Fair ?Standing balance comment: able to stand at sink without RW support, turns to R /L side without LOB ?  ?  ?  ?  ?  ?  ?  ?  ?  ?  ?  ?   ? ?ADL either performed or assessed with clinical judgement  ? ?ADL Overall ADL's : Needs assistance/impaired ?Eating/Feeding: Set up;Sitting ?  ?Grooming: Therapist, nutritional;Wash/dry hands;Oral care;Standing ?  ?Upper Body Bathing: Set up;Sitting ?  ?Lower Body Bathing: Moderate assistance;Sitting/lateral leans ?  ?Upper Body Dressing : Set up;Sitting ?  ?Lower Body Dressing: Moderate assistance;Sit to/from stand;Sitting/lateral leans ?Lower Body Dressing Details (indicate cue type and reason): simulates donning sock sitting EOB ?Toilet Transfer: Min guard;Rolling walker (2 wheels);Ambulation;Regular Toilet ?  ?Toileting- Clothing Manipulation and Hygiene: Supervision/safety;Sit to/from stand ?Toileting - Clothing Manipulation Details (indicate cue type and reason): completes pericare and clothing mgmt ?  ?  ?Functional mobility during ADLs: Min guard;Rolling walker (  2 wheels) ?  ?  ? ?Extremity/Trunk Assessment Upper Extremity Assessment ?Upper Extremity Assessment: Overall WFL for tasks assessed ?  ?Lower Extremity Assessment ?Lower Extremity Assessment:  Defer to PT evaluation ?  ?  ?  ? ?Vision   ?Vision Assessment?: No apparent visual deficits ?  ?Perception Perception ?Perception: Not tested ?  ?Praxis Praxis ?Praxis: Not tested ?  ? ?Cognition Arousal/Alertness: Awake/alert ?Behavior [facility]: [facility] for tasks assessed/performed ?Overall Cognitive Status: Within Functional Limits for tasks assessed ?  ?  ?  ?  ?  ?  ?  ?  ?  ?  ?  ?  ?  ?  ?  ?  ?  ?  ?  ?   ?Exercises   ? ?  ?Shoulder Instructions   ? ? ?  ?General Comments daughters present in room during session  ? ? ?Pertinent Vitals/ Pain       Pain Assessment ?Pain Assessment: Faces ?Pain Score: 3  ?Faces Pain Scale: Hurts little more ?Pain Location: R hip ?Pain Descriptors / Indicators: Aching, Discomfort, Dull, Guarding ?Pain Intervention(s): Limited activity within patient's tolerance, Monitored during session, Repositioned ? ?Home Living   ?  ?  ?  ?  ?  ?  ?  ?  ?  ?  ?  ?  ?  ?  ?  ?  ?  ?  ? ?  ?Prior Functioning/Environment    ?  ?  ?  ?   ? ?Frequency ? Min 2X/week  ? ? ? ? ?  ?Progress Toward Goals ? ?OT Goals(current goals can now be found in the care plan section) ? Progress towards OT goals: Progressing toward goals ? ?Acute Rehab OT Goals ?Patient Stated Goal: to get better ?OT Goal Formulation: With patient ?Time For Goal Achievement: 11/23/21 ?Potential to Achieve Goals: Good ?ADL Goals ?Pt Will Perform Upper Body Dressing: with modified independence ?Pt Will Perform Lower Body Dressing: with modified independence;with adaptive equipment ?Pt Will Transfer to Toilet: with modified independence;stand pivot transfer ?Pt Will Perform Toileting - Clothing Manipulation and hygiene: with modified independence;sit to/from stand ?Pt Will Perform Tub/Shower Transfer: with supervision;Tub transfer;tub bench  ?Plan Discharge plan remains appropriate;Frequency remains appropriate   ? ?Co-evaluation ? ? ?   ?  ?  ?  ?  ? ?  ?AM-PAC OT "6 Clicks" Daily Activity     ?Outcome Measure ? ? Help from  another person eating meals?: None ?Help from another person taking care of personal grooming?: None ?Help from another person toileting, which includes using toliet, bedpan, or urinal?: A Little ?Help from another person bathing (including washing, rinsing, drying)?: A Lot ?Help from another person to put on and taking off regular upper body clothing?: None ?Help from another person to put on and taking off regular lower body clothing?: A Lot ?6 Click Score: 19 ? ?  ?End of Session Equipment Utilized During Treatment: Gait belt;Rolling walker (2 wheels) ? ?OT Visit Diagnosis: Unsteadiness on feet (R26.81);Muscle weakness (generalized) (M62.81) ?  ?Activity Tolerance Patient tolerated treatment well ?  ?Patient Left in chair;with call bell/phone within reach;with family/visitor present;with chair alarm set ?  ?Nurse Communication Mobility status;Patient requests pain meds ?  ? ?   ? ?Time: [id number] ?OT Time Calculation (min): 35 min ? ?Charges: OT General Charges ?$OT Visit: 1 Visit ?OT Treatments ?$Self Care/Home Management : 8-22 mins ?$Therapeutic Activity: 8-22 mins ? ?Alfonzo BeersNatalie Orest Dygert, OTD, OTR/L ?Acute Rehab ?[phone] ? ? ?  Mayer Masker ?11/11/2021, 10:15 AM ?

## 2021-11-11 NOTE — Progress Notes (Addendum)
Physical Therapy Treatment ?Patient Details ?Name: Shelby Chan ?MRN: [id number] ?DOB: [birth year] ?Today's Date: 11/11/2021 ? ? ?History of Present Illness Pt is a 88 y.o. female who presents to the orthopedic department with R hip pain after falling 2 weeks prior. X-rays revealed an impacted right subcapital hip fracture. Pt is s/p right hip screw placement for femoral neck fracture on 3/9.  No significant past medical history. ? ?  ?PT Comments  ? ? Pt was seen for session to look at gait and instruct her on ex's for home and rehab.  Her strength in R hip along with ROM in ankles and changes in foot structure put her at higher fall risk, so recommend her to continue to plan for SNF short stay.  Stair training will need to be done and currently has no rails on home steps.  Pt also requires assistance for safety with all gait, and so follow up with her for goals of acute PT.   ?Recommendations for follow up therapy are one component of a multi-disciplinary discharge planning process, led by the attending physician.  Recommendations may be updated based on patient status, additional functional criteria and insurance authorization. ? ?Follow Up Recommendations ? Skilled nursing-short term rehab (<3 hours/day) ?  ?  ?Assistance Recommended at Discharge Intermittent Supervision/Assistance  ?Patient can return home with the following A little help with walking and/or transfers;Assistance with cooking/housework;Assist for transportation;Help with stairs or ramp for entrance;A little help with bathing/dressing/bathroom ?  ?Equipment Recommendations ? Rolling walker (2 wheels)  ?  ?Recommendations for Other Services OT consult ? ? ?  ?Precautions / Restrictions Precautions ?Precautions: Fall ?Restrictions ?Weight Bearing Restrictions: Yes ?RLE Weight Bearing: Weight bearing as tolerated  ?  ? ?Mobility ? Bed Mobility ?Overal bed mobility: Needs Assistance ?  ?  ?  ?  ?  ?  ?General bed mobility comments: up in chair when  PT arrives ?  ? ?Transfers ?Overall transfer level: Needs assistance ?Equipment used: Rolling walker (2 wheels) ?Transfers: Sit to/from Stand ?Sit to Stand: Min guard ?  ?  ?  ?  ?  ?General transfer comment: reminders for safety of hand placement ?  ? ?Ambulation/Gait ?Ambulation/Gait assistance: Min guard ?Gait Distance (Feet): 80 Feet ?Assistive device: Rolling walker (2 wheels), 1 person hand held assist ?Gait Pattern/deviations: Step-to pattern, Decreased stride length, Decreased weight shift to right, Wide base of support ?Gait velocity: reduced ?Gait velocity interpretation: <1.31 ft/sec, indicative of household ambulator ?Pre-gait activities: standing balance and posture ?General Gait Details: pt is in a small shuffle with avoidance of R hip using walker ? ? ?Stairs ?  ?  ?  ?  ?  ? ? ?Wheelchair Mobility ?  ? ?Modified Rankin (Stroke Patients Only) ?  ? ? ?  ?Balance Overall balance assessment: Needs assistance ?Sitting-balance support: Feet supported ?Sitting balance-Leahy Scale: Good ?  ?  ?  ?Standing balance-Leahy Scale: Fair ?Standing balance comment: pt is using walker with careful turns and set up to sit ?  ?  ?  ?  ?  ?  ?  ?  ?  ?  ?  ?  ? ?  ?Cognition Arousal/Alertness: Awake/alert ?Behavior During Therapy: [facility] for tasks assessed/performed ?Overall Cognitive Status: Within Functional Limits for tasks assessed ?  ?  ?  ?  ?  ?  ?  ?  ?  ?  ?  ?  ?  ?  ?  ?  ?General Comments: family in room to discuss  her dc plans ?  ?  ? ?  ?Exercises General Exercises - Lower Extremity ?Ankle Circles/Pumps: AROM, 5 reps ?Quad Sets: AROM, 10 reps ?Gluteal Sets: AROM, 10 reps ?Hip ABduction/ADduction: AROM, 10 reps ?Other Exercises ?Other Exercises: hip abd strength 3+ on RLE ? ?  ?General Comments General comments (skin integrity, edema, etc.): daughters are talking about an ALF in [place] with hopes to send mother there.  Message sent to staff for planning and decisions ?  ?  ? ?Pertinent Vitals/Pain Pain  Assessment ?Pain Assessment: Faces ?Faces Pain Scale: Hurts little more ?Pain Location: R hip ?Pain Descriptors / Indicators: Operative site guarding ?Pain Intervention(s): Limited activity within patient's tolerance, Monitored during session, Premedicated before session, Repositioned  ? ? ?Home Living   ?  ?  ?  ?  ?  ?  ?  ?  ?  ?   ?  ?Prior Function    ?  ?  ?   ? ?PT Goals (current goals can now be found in the care plan section) Acute Rehab PT Goals ?Patient Stated Goal: maintain independance ?Progress towards PT goals: Progressing toward goals ? ?  ?Frequency ? ? ? Min 3X/week ? ? ? ?  ?PT Plan Current plan remains appropriate  ? ? ?Co-evaluation   ?  ?  ?  ?  ? ?  ?AM-PAC PT "6 Clicks" Mobility   ?Outcome Measure ? Help needed turning from your back to your side while in a flat bed without using bedrails?: A Little ?Help needed moving from lying on your back to sitting on the side of a flat bed without using bedrails?: A Little ?Help needed moving to and from a bed to a chair (including a wheelchair)?: A Little ?Help needed standing up from a chair using your arms (e.g., wheelchair or bedside chair)?: A Little ?Help needed to walk in hospital room?: A Little ?Help needed climbing 3-5 steps with a railing? : A Lot ?6 Click Score: 17 ? ?  ?End of Session Equipment Utilized During Treatment: Gait belt ?Activity Tolerance: Patient tolerated treatment well ?Patient left: in chair;with call bell/phone within reach;with chair alarm set ?Nurse Communication: Mobility status ?PT Visit Diagnosis: Muscle weakness (generalized) (M62.81);Pain ?Pain - Right/Left: Right ?Pain - part of body: Hip ?  ? ? ?Time: 6962-9528 ?PT Time Calculation (min) (ACUTE ONLY): 32 min ? ?Charges:  $Gait Training: 8-22 mins ?$Therapeutic Exercise: 8-22 mins ?Ivar Drape ?11/11/2021, 11:58 AM ? ?Samul Dada, PT PhD ?Acute Rehab Dept. Number: [facility] [phone] and [facility] [phone] ? ? ?

## 2021-11-11 NOTE — Plan of Care (Signed)

## 2021-11-12 NOTE — Plan of Care (Signed)
?  Problem: Education: ?Goal: Knowledge of General Education information will improve ?Description: Including pain rating scale, medication(s)/side effects and non-pharmacologic comfort measures ?Outcome: Progressing ?  ?Problem: Health Behavior/Discharge Planning: ?Goal: Ability to manage health-related needs will improve ?Outcome: Progressing ?  ?Problem: Clinical Measurements: ?Goal: Ability to maintain clinical measurements within normal limits will improve ?Outcome: Progressing ?  ?Problem: Coping: ?Goal: Level of anxiety will decrease ?Outcome: Progressing ?  ?Problem: Safety: ?Goal: Ability to remain free from injury will improve ?Outcome: Progressing ?  ?

## 2021-11-12 NOTE — TOC Progression Note (Signed)
Transition of Care (TOC) - Progression Note  ? ? ?Patient Details  ?Name: Shelby Chan ?MRN: [id number] ?Date of Birth: Jul 15, 1933 ? ?Transition of Care (TOC) CM/SW Contact  ?Joanne Chars, LCSW ?Phone Number: ?11/12/2021, 12:47 PM ? ?Clinical Narrative:   level 2 passr approved: [id number] A ? ?CSW spoke with [facility].  Josem Kaufmann has been submitted but not yet approve.  ? ? ? ?Expected Discharge Plan: [place] ?Barriers to Discharge: Ship broker, Continued Medical Work up, SNF Pending bed offer ? ?Expected Discharge Plan and Services ?Expected Discharge Plan: [place] ?  ?  ?  ?Living arrangements for the past 2 months: [place] ?Expected Discharge Date: 11/10/21               ?  ?  ?  ?  ?  ?  ?  ?  ?  ?  ? ? ?Social Determinants of Health (SDOH) Interventions ?  ? ?Readmission Risk Interventions ?No flowsheet data found. ? ?

## 2021-11-12 NOTE — Progress Notes (Signed)
Physical Therapy Treatment ?Patient Details ?Name: Shelby Chan ?MRN: [id number] ?DOB: March 06, 1933 ?Today's Date: 11/12/2021 ? ? ?History of Present Illness Pt is a 88 y.o. female who presents to the orthopedic department with R hip pain after falling 2 weeks prior. X-rays revealed an impacted right subcapital hip fracture. Pt is s/p right hip screw placement for femoral neck fracture on 3/9.  No significant past medical history. ? ?  ?PT Comments  ? ? Pt making excellent progress towards her physical therapy goals; requiring less assist overall for functional mobility and demonstrates improved pain control and activity tolerance. Pt ambulating 250 feet with and without a walker at a supervision level. Demonstrates improved step through pattern, fluidity of gait and foot clearance with walker compared to without. Able to perform standing exercises for RLE strengthening. Recommend HHPT to address deficits and maximize functional mobility.  ?  ?Recommendations for follow up therapy are one component of a multi-disciplinary discharge planning process, led by the attending physician.  Recommendations may be updated based on patient status, additional functional criteria and insurance authorization. ? ?Follow Up Recommendations ? Home health PT (at ALF) ?  ?  ?Assistance Recommended at Discharge Intermittent Supervision/Assistance  ?Patient can return home with the following A little help with walking and/or transfers;Assistance with cooking/housework;Assist for transportation;Help with stairs or ramp for entrance;A little help with bathing/dressing/bathroom ?  ?Equipment Recommendations ? Rolling walker (2 wheels)  ?  ?Recommendations for Other Services   ? ? ?  ?Precautions / Restrictions Precautions ?Precautions: Fall ?Restrictions ?Weight Bearing Restrictions: Yes ?RLE Weight Bearing: Weight bearing as tolerated  ?  ? ?Mobility ? Bed Mobility ?  ?  ?  ?  ?  ?  ?  ?General bed mobility comments: Sitting EOB upon  arrival ?  ? ?Transfers ?Overall transfer level: Independent ?Equipment used: Rolling walker (2 wheels), None ?  ?  ?  ?  ?  ?  ?  ?  ?  ? ?Ambulation/Gait ?Ambulation/Gait assistance: Supervision ?Gait Distance (Feet): 250 Feet ?Assistive device: Rolling walker (2 wheels), None ?Gait Pattern/deviations: Step-to pattern, Decreased stride length, Decreased weight shift to right, Step-through pattern, Shuffle, Decreased dorsiflexion - right ?Gait velocity: reduced ?  ?  ?General Gait Details: Pt initially ambulated first ~50 ft with no AD, demonstrates step to pattern with decreased step length. Increased fluidity of gait with walker, progressing to step through pattern. Cues for upright posture and R heel strike at initial contact. Steady pace ? ? ?Stairs ?  ?  ?  ?  ?  ? ? ?Wheelchair Mobility ?  ? ?Modified Rankin (Stroke Patients Only) ?  ? ? ?  ?Balance Overall balance assessment: Needs assistance ?Sitting-balance support: Feet supported ?Sitting balance-Leahy Scale: Good ?  ?  ?Standing balance support: No upper extremity supported, During functional activity ?Standing balance-Leahy Scale: Fair ?  ?  ?  ?  ?  ?  ?  ?  ?  ?  ?  ?  ?  ? ?  ?Cognition Arousal/Alertness: Awake/alert ?Behavior During Therapy: [facility] for tasks assessed/performed ?Overall Cognitive Status: Within Functional Limits for tasks assessed ?  ?  ?  ?  ?  ?  ?  ?  ?  ?  ?  ?  ?  ?  ?  ?  ?  ?  ?  ? ?  ?Exercises Other Exercises ?Other Exercises: Standing: bilateral hamstring curls x 10 each, hip abduction x 10 each, hip extension x 5 each, hip  flexion x 10 each ? ?  ?General Comments   ?  ?  ? ?Pertinent Vitals/Pain Pain Assessment ?Pain Assessment: Faces ?Faces Pain Scale: Hurts a little bit ?Pain Location: R hip ?Pain Descriptors / Indicators: Operative site guarding ?Pain Intervention(s): Monitored during session  ? ? ?Home Living   ?  ?  ?  ?  ?  ?  ?  ?  ?  ?   ?  ?Prior Function    ?  ?  ?   ? ?PT Goals (current goals can now be found in  the care plan section) Acute Rehab PT Goals ?Patient Stated Goal: maintain independance ?Potential to Achieve Goals: Good ?Progress towards PT goals: Progressing toward goals ? ?  ?Frequency ? ? ? Min 5X/week ? ? ? ?  ?PT Plan Discharge plan needs to be updated;Frequency needs to be updated  ? ? ?Co-evaluation   ?  ?  ?  ?  ? ?  ?AM-PAC PT "6 Clicks" Mobility   ?Outcome Measure ? Help needed turning from your back to your side while in a flat bed without using bedrails?: None ?Help needed moving from lying on your back to sitting on the side of a flat bed without using bedrails?: None ?Help needed moving to and from a bed to a chair (including a wheelchair)?: None ?Help needed standing up from a chair using your arms (e.g., wheelchair or bedside chair)?: None ?Help needed to walk in hospital room?: A Little ?Help needed climbing 3-5 steps with a railing? : A Little ?6 Click Score: 22 ? ?  ?End of Session Equipment Utilized During Treatment: Gait belt ?Activity Tolerance: Patient tolerated treatment well ?Patient left: with call bell/phone within reach;in bed;with family/visitor present ?Nurse Communication: Mobility status ?PT Visit Diagnosis: Muscle weakness (generalized) (M62.81);Pain ?Pain - Right/Left: Right ?Pain - part of body: Hip ?  ? ? ?Time: 8280-0349 ?PT Time Calculation (min) (ACUTE ONLY): 23 min ? ?Charges:  $Gait Training: 8-22 mins ?$Therapeutic Exercise: 8-22 mins          ?          ? ?Lillia Pauls, PT, DPT ?Acute Rehabilitation Services ?Pager [phone] ?Office [phone] ? ? ? ?[name] ?11/12/2021, 4:31 PM ? ?

## 2021-11-12 NOTE — Progress Notes (Signed)
Patient seen and examined at the bedside this morning.  Hemodynamically stable.  No complaints today.  She is medically stable for discharge.  No change in the medical management.  Discharge summary and orders are in ?

## 2021-11-12 NOTE — Progress Notes (Addendum)
Occupational Therapy Treatment ?Patient Details ?Name: Shelby Chan ?MRN: [id number] ?DOB: April 20, 1933 ?Today's Date: 11/12/2021 ? ? ?History of present illness Pt is a 88 y.o. female who presents to the orthopedic department with R hip pain after falling 2 weeks prior. X-rays revealed an impacted right subcapital hip fracture. Pt is s/p right hip screw placement for femoral neck fracture on 3/9.  No significant past medical history. ?  ?OT comments ? Pt making good progress towards goals, completes ADLs at a set up - supervision level standing at sink, mod I for bed mobility and supervision-min guard for transfers with RW. Pt able to ambulate household and hallway distance during session. Pt presenting with impairments listed below, will follow.   ? ?Recommendations for follow up therapy are one component of a multi-disciplinary discharge planning process, led by the attending physician.  Recommendations may be updated based on patient status, additional functional criteria and insurance authorization. ?   ?Follow Up Recommendations ? Home health OT (at ALF) ?  ?Assistance Recommended at Discharge Set up Supervision/Assistance  ?Patient can return home with the following ? A little help with walking and/or transfers;A little help with bathing/dressing/bathroom;Assistance with cooking/housework;Assist for transportation;Help with stairs or ramp for entrance ?  ?Equipment Recommendations ? None recommended by OT;Other (comment) (defer to next venue of care)  ?  ?Recommendations for Other Services   ? ?  ?Precautions / Restrictions Precautions ?Precautions: Fall ?Restrictions ?Weight Bearing Restrictions: Yes ?RLE Weight Bearing: Weight bearing as tolerated  ? ? ?  ? ?Mobility Bed Mobility ?Overal bed mobility: Modified Independent ?  ?  ?  ?  ?  ?  ?  ?  ? ?Transfers ?Overall transfer level: Needs assistance ?Equipment used: Rolling walker (2 wheels) ?Transfers: Sit to/from Stand ?Sit to Stand: Supervision, Min  guard ?Stand pivot transfers: Supervision, Min guard ?  ?  ?  ?  ?  ?  ?  ?Balance Overall balance assessment: Needs assistance ?Sitting-balance support: Feet supported ?Sitting balance-Leahy Scale: Normal ?Sitting balance - Comments: pt able to reach outside BOS to pull up sock without LOB ?  ?  ?Standing balance-Leahy Scale: Fair ?Standing balance comment: pt is using walker with careful turns and set up to sit ?  ?  ?  ?  ?  ?  ?  ?  ?  ?  ?  ?   ? ?ADL either performed or assessed with clinical judgement  ? ?ADL   ?  ?  ?Grooming: Therapist, nutritional;Wash/dry hands;Oral care;Standing;Supervision/safety ?Grooming Details (indicate cue type and reason): completed standing at sink ?  ?  ?  ?  ?Upper Body Dressing : Set up;Sitting ?Upper Body Dressing Details (indicate cue type and reason): dons sweater ?Lower Body Dressing: Supervision/safety;Sit to/from stand ?Lower Body Dressing Details (indicate cue type and reason): dons pants ?Toilet Transfer: Supervision/safety;Rolling walker (2 wheels);Ambulation ?  ?Toileting- Clothing Manipulation and Hygiene: Supervision/safety;Sit to/from stand ?Toileting - Clothing Manipulation Details (indicate cue type and reason): completes pericare and clothing mgmt ?  ?  ?Functional mobility during ADLs: Supervision/safety;Rolling walker (2 wheels) ?  ?  ? ?Extremity/Trunk Assessment Upper Extremity Assessment ?Upper Extremity Assessment: Overall WFL for tasks assessed ?  ?Lower Extremity Assessment ?Lower Extremity Assessment: Defer to PT evaluation ?  ?  ?  ? ?Vision   ?Vision Assessment?: No apparent visual deficits ?  ?Perception Perception ?Perception: Not tested ?  ?Praxis Praxis ?Praxis: Not tested ?  ? ?Cognition Arousal/Alertness: Awake/alert ?Behavior During Therapy: [facility] for tasks  assessed/performed ?Overall Cognitive Status: Within Functional Limits for tasks assessed ?  ?  ?  ?  ?  ?  ?  ?  ?  ?  ?  ?  ?  ?  ?  ?  ?  ?  ?  ?   ?Exercises   ? ?  ?Shoulder Instructions   ? ? ?   ?General Comments daughters in room present during session  ? ? ?Pertinent Vitals/ Pain       Pain Assessment ?Pain Assessment: No/denies pain ? ?Home Living   ?  ?  ?  ?  ?  ?  ?  ?  ?  ?  ?  ?  ?  ?  ?  ?  ?  ?  ? ?  ?Prior Functioning/Environment    ?  ?  ?  ?   ? ?Frequency ? Min 2X/week  ? ? ? ? ?  ?Progress Toward Goals ? ?OT Goals(current goals can now be found in the care plan section) ? Progress towards OT goals: Progressing toward goals ? ?Acute Rehab OT Goals ?Patient Stated Goal: to get better ?OT Goal Formulation: With patient ?Time For Goal Achievement: 11/23/21 ?Potential to Achieve Goals: Good ?ADL Goals ?Pt Will Perform Upper Body Dressing: with modified independence ?Pt Will Perform Lower Body Dressing: with modified independence;with adaptive equipment ?Pt Will Transfer to Toilet: with modified independence;stand pivot transfer ?Pt Will Perform Toileting - Clothing Manipulation and hygiene: with modified independence;sit to/from stand ?Pt Will Perform Tub/Shower Transfer: with supervision;Tub transfer;tub bench  ?Plan Discharge plan remains appropriate;Frequency remains appropriate   ? ?Co-evaluation ? ? ?   ?  ?  ?  ?  ? ?  ?AM-PAC OT "6 Clicks" Daily Activity     ?Outcome Measure ? ? Help from another person eating meals?: None ?Help from another person taking care of personal grooming?: None ?Help from another person toileting, which includes using toliet, bedpan, or urinal?: None ?Help from another person bathing (including washing, rinsing, drying)?: A Little ?Help from another person to put on and taking off regular upper body clothing?: A Little ?Help from another person to put on and taking off regular lower body clothing?: A Little ?6 Click Score: 21 ? ?  ?End of Session Equipment Utilized During Treatment: Gait belt;Rolling walker (2 wheels) ? ?OT Visit Diagnosis: Unsteadiness on feet (R26.81);Muscle weakness (generalized) (M62.81) ?  ?Activity Tolerance Patient tolerated treatment  well ?  ?Patient Left in chair;with call bell/phone within reach;with family/visitor present ?  ?Nurse Communication Mobility status (pt does not need chair alarm) ?  ? ?   ? ?Time: 7741-2878 ?OT Time Calculation (min): 30 min ? ?Charges: OT General Charges ?$OT Visit: 1 Visit ?OT Treatments ?$Self Care/Home Management : 8-22 mins ?$Therapeutic Activity: 8-22 mins ? ?Alfonzo Beers, OTD, OTR/L ?Acute Rehab ?[phone] ? ? ?Mayer Masker ?11/12/2021, 9:18 AM ?

## 2021-11-13 ENCOUNTER — Telehealth: Payer: Self-pay | Admitting: Orthopaedic Surgery

## 2021-11-13 ENCOUNTER — Other Ambulatory Visit (HOSPITAL_BASED_OUTPATIENT_CLINIC_OR_DEPARTMENT_OTHER): Payer: Self-pay | Admitting: Orthopaedic Surgery

## 2021-11-13 DIAGNOSIS — S72001A Fracture of unspecified part of neck of right femur, initial encounter for closed fracture: Secondary | ICD-10-CM

## 2021-11-13 MED ORDER — SENNA 8.6 MG PO TABS: 1.0000 | ORAL_TABLET | Freq: Two times a day (BID) | ORAL | 0 refills | Status: DC

## 2021-11-13 MED ORDER — ENOXAPARIN SODIUM 40 MG/0.4ML IJ SOSY: 40.0000 mg | PREFILLED_SYRINGE | INTRAMUSCULAR | 0 refills | Status: DC

## 2021-11-13 MED ORDER — POLYETHYLENE GLYCOL 3350 17 G PO PACK: 17.0000 g | PACK | Freq: Every day | ORAL | 0 refills | Status: DC

## 2021-11-13 MED ORDER — ASPIRIN EC 81 MG PO TBEC: 81.0000 mg | DELAYED_RELEASE_TABLET | Freq: Two times a day (BID) | ORAL | 0 refills | Status: AC

## 2021-11-13 NOTE — Progress Notes (Addendum)
Physical Therapy Treatment ?Patient Details ?Name: Shelby Chan ?MRN: [id number] ?DOB: 14-Aug-1933 ?Today's Date: 11/13/2021 ? ? ?History of Present Illness Pt is an 88 y.o. female admitted 11/06/21 with R hip pain after falling 2 wks prior. Workup revealed impacted R subcapital hip fx; s/p R hip pinning on 3/9. No significant PMH. ?  ?PT Comments  ? ? Pt progressing with mobility, preparing for d/c home today. Today's session focused on gait training with rollator; pt and daughter agreeable for use of rollator of rolling walker, which seems more appropriate for pt's current mobility and goals. Reviewed educ with pt and daughter who report no further questions or concerns. If pt to remain admitted, will continue to follow acutely. ?   ?Recommendations for follow up therapy are one component of a multi-disciplinary discharge planning process, led by the attending physician.  Recommendations may be updated based on patient status, additional functional criteria and insurance authorization. ? ?Follow Up Recommendations ? Home health PT ?  ?  ?Assistance Recommended at Discharge Intermittent Supervision/Assistance  ?Patient can return home with the following Assistance with cooking/housework;Assist for transportation;Help with stairs or ramp for entrance ?  ?Equipment Recommendations ? Rollator (4 wheels)  ?  ?Recommendations for Other Services   ? ? ?  ?Precautions / Restrictions Precautions ?Precautions: Fall ?Restrictions ?Weight Bearing Restrictions: Yes ?RLE Weight Bearing: Weight bearing as tolerated  ?  ? ?Mobility ? Bed Mobility ?  ?  ?  ?  ?  ?  ?  ?General bed mobility comments: Received sitting in recliner ?  ? ?Transfers ?Overall transfer level: Independent ?Equipment used: None, Rollator (4 wheels), Rolling walker (2 wheels) ?  ?  ?  ?  ?  ?  ?  ?General transfer comment: multiple sit<>stands with and without DME; able to perform sit<>stands from rollator seat, cues for locking brakes ?   ? ?Ambulation/Gait ?Ambulation/Gait assistance: Supervision ?Gait Distance (Feet): 600 Feet ?Assistive device: Rolling walker (2 wheels), Rollator (4 wheels), None ?Gait Pattern/deviations: Step-through pattern, Decreased stride length, Trunk flexed, Antalgic ?  ?  ?  ?General Gait Details: pt initially requesting RW, additional gait trial with rollator; slow, steady gait with DME and supervision for safety; noted improvements in gait mechanics with minimal cues to maintain closer proximity to RW, upright posture and RLE step through. Pt self-correcting, "I need to look up"; additional in-room ambulation withotu DME ? ? ?Stairs ?Stairs: Yes ?Stairs assistance: Supervision ?Stair Management: Two rails, Alternating pattern, Forwards ?Number of Stairs: 4 ?  ? ? ?Wheelchair Mobility ?  ? ?Modified Rankin (Stroke Patients Only) ?  ? ? ?  ?Balance Overall balance assessment: Needs assistance ?Sitting-balance support: Feet supported ?Sitting balance-Leahy Scale: Good ?  ?  ?Standing balance support: No upper extremity supported, During functional activity ?Standing balance-Leahy Scale: Good ?  ?  ?  ?  ?  ?  ?  ?  ?  ?  ?  ?  ?  ? ?  ?Cognition Arousal/Alertness: Awake/alert ?Behavior During Therapy: [facility] for tasks assessed/performed ?Overall Cognitive Status: Within Functional Limits for tasks assessed ?  ?  ?  ?  ?  ?  ?  ?  ?  ?  ?  ?  ?  ?  ?  ?  ?General Comments: WFL for simple tasks; potential memory deficits as pt repetitive in conversation ?  ?  ? ?  ?Exercises   ? ?  ?General Comments General comments (skin integrity, edema, etc.): daughter present and supportive;  discussed recommendation for rollator vs. RW, both agreeable to rollator. educ on fall risk reduction ?  ?  ? ?Pertinent Vitals/Pain Pain Assessment ?Pain Assessment: Faces ?Faces Pain Scale: Hurts a little bit ?Pain Location: R hip ?Pain Descriptors / Indicators: Sore ?Pain Intervention(s): Monitored during session  ? ? ?Home Living   ?  ?  ?  ?  ?  ?   ?  ?  ?  ?   ?  ?Prior Function    ?  ?  ?   ? ?PT Goals (current goals can now be found in the care plan section) Progress towards PT goals: Progressing toward goals ? ?  ?Frequency ? ? ? Min 5X/week ? ? ? ?  ?PT Plan Equipment recommendations need to be updated  ? ? ?Co-evaluation   ?  ?  ?  ?  ? ?  ?AM-PAC PT "6 Clicks" Mobility   ?Outcome Measure ? Help needed turning from your back to your side while in a flat bed without using bedrails?: None ?Help needed moving from lying on your back to sitting on the side of a flat bed without using bedrails?: None ?Help needed moving to and from a bed to a chair (including a wheelchair)?: None ?Help needed standing up from a chair using your arms (e.g., wheelchair or bedside chair)?: A Little ?Help needed to walk in hospital room?: A Little ?Help needed climbing 3-5 steps with a railing? : A Little ?6 Click Score: 21 ? ?  ?End of Session   ?Activity Tolerance: Patient tolerated treatment well ?Patient left: in chair;with call bell/phone within reach;with family/visitor present ?Nurse Communication: Mobility status ?PT Visit Diagnosis: Muscle weakness (generalized) (M62.81);Pain ?  ? ? ?Time: 0263-7858 ?PT Time Calculation (min) (ACUTE ONLY): 16 min ? ?Charges:  $Gait Training: 8-22 mins          ?          ? ?Shelby Chan, PT, DPT ?Acute Rehabilitation Services  ?Pager [phone] ?Office [phone] ? ?Shelby Chan ?11/13/2021, 11:05 AM ? ?

## 2021-11-13 NOTE — Telephone Encounter (Signed)
Pt's daughter Rosalita Chessman called requesting a script for a walker for pt. Pt had surgery and is being discharged today from hospital. Please call daughter about this matter at [phone]. ?

## 2021-11-13 NOTE — TOC Transition Note (Signed)
Transition of Care (TOC) - CM/SW Discharge Note ? ? ?Patient Details  ?Name: Shelby Chan ?MRN: [id number] ?Date of Birth: 30-Jun-1933 ? ?Transition of Care [facility]) CM/SW Contact:  ?Joanne Chars, LCSW ?Phone Number: ?11/13/2021, 1:34 PM ? ? ?Clinical Narrative:   Pt discharging to [facility] with [facility] services.  RN call report to [phone]. Family will transport pt.   ? ? ? ?Final next level of care: Assisted Living ?Barriers to Discharge: Barriers Resolved ? ? ?Patient Goals and CMS Choice ?Patient states their goals for this hospitalization and ongoing recovery are:: To Walk ?CMS Medicare.gov Compare Post Acute Care list provided to:: Patient ?Choice offered to / list presented to : Patient ? ?Discharge Placement ?  ?           ?Patient chooses bed at:  ([facility]) ?Patient to be transferred to facility by: family requested to transfer pt themselves ?Name of family member notified: pt daughter in room, younger daughter, not [facility] ?Patient and family notified of of transfer: 11/13/21 ? ?Discharge Plan and Services ?  ?  ?           ?  ?  ?  ?  ?  ?  ?  ?  ?  ?  ? ?Social Determinants of Health (SDOH) Interventions ?  ? ? ?Readmission Risk Interventions ?No flowsheet data found. ? ? ? ? ?

## 2021-11-13 NOTE — TOC Progression Note (Addendum)
Transition of Care (TOC) - Progression Note  ? ? ?Patient Details  ?Name: Shelby Chan ?MRN: [id number] ?Date of Birth: [birth year] ? ?Transition of Care (TOC) CM/SW Contact  ?Lorri Frederick, LCSW ?Phone Number: ?11/13/2021, 12:44 PM ? ?Clinical Narrative:   CSW informed that pt progressed with PT, new recommendation for HH at ALF, daughter is wanting to revert back to plan for ALF. ? ?CSW spoke with director at [facility] who is requesting: updated FL2, chest xray for TB, labs, DC summary.  All of these were faxed. ? ?1100: TC ffrom [facility].  She was just informed by Cordelia Poche of [place] that [facility] is out of network and they are not an option as SNF.  There are some in network options in [place]. ? ?1300: more issues with the chest xray not stating no TB, CSW spoke with MD and this cannot be changed.  CSW updated Misty Stanley at [employer].  Issues with lovenox, MD will change to oral medication.  Issues with med list on FL2, was fixed and refaxed, along with updated DC summary and PT/OT HH orders.   ? ?1325: CSW spoke with Misty Stanley at [employer] [employer] about all of the above and she said she was good, pt OK to discharge.  Family has asked to transport pt themselves, MD in agreement, pt daughter to transport. ? ? ? ?Expected Discharge Plan: Skilled Nursing Facility ?Barriers to Discharge: English as a second language teacher, Continued Medical Work up, SNF Pending bed offer ? ?Expected Discharge Plan and Services ?Expected Discharge Plan: Skilled Nursing Facility ?  ?  ?  ?Living arrangements for the past 2 months: Single Family Home ?Expected Discharge Date: 11/10/21               ?  ?  ?  ?  ?  ?  ?  ?  ?  ?  ? ? ?Social Determinants of Health (SDOH) Interventions ?  ? ?Readmission Risk Interventions ?No flowsheet data found. ? ?

## 2021-11-13 NOTE — Progress Notes (Signed)
Patient is medically stable for discharge to ALF/skilled nursing facility.  No new change in the medical management.  Discharge summary and orders are in. ?

## 2021-11-13 NOTE — NC FL2 (Addendum)
?[place] MEDICAID FL2 LEVEL OF CARE SCREENING TOOL  ?  ? ?IDENTIFICATION  ?Patient Name: ?Shelby Chan Birthdate: 10/02/1932 Sex: female Admission Date (Current Location): ?11/06/2021  ?[place] Number: ? [place] ?  Facility and Address:  ?[facility], [address] [place], [place] [address] ?     Provider Number: ?[id number]  ?Attending Physician Name and Address:  ?Burnadette Pop, MD ? Relative Name and Phone Number:  ?Elsie Amis (Daughter)   [phone] ?   ?Current Level of Care: ?Hospital Recommended Level of Care: ?Assisted Living Facility Prior Approval Number: ?  ? ?Date Approved/Denied: ?  PASRR Number: ?Pending ? ?Discharge Plan: ?SNF ?  ? ?Current Diagnoses: ?Patient Active Problem List  ? Diagnosis Date Noted  ? Closed right hip fracture (HCC) 11/06/2021  ? Impacted fracture of right hip (HCC) secondary to fall 11/06/2021  ? Fall 11/06/2021  ? Pain in left ankle and joints of left foot 06/12/2021  ? Pain in left leg 04/13/2019  ? ? ?Orientation RESPIRATION BLADDER Height & Weight   ?  ?Self, Time, Situation, Place ? Normal Incontinent, External catheter Weight: 160 lb (72.6 kg) ?Height:  5\' 5"  (165.1 cm)  ?BEHAVIORAL SYMPTOMS/MOOD NEUROLOGICAL BOWEL NUTRITION STATUS  ?    Continent Diet (See DC Summary)  ?AMBULATORY STATUS COMMUNICATION OF NEEDS Skin   ?Extensive Assist Verbally Normal ?  ?  ?  ?    ?     ?     ? ? ?Personal Care Assistance Level of Assistance  ?Bathing, Feeding, Dressing Bathing Assistance: Maximum assistance ?Feeding assistance: Independent ?Dressing Assistance: Maximum assistance ?   ? ?Functional Limitations Info  ?Sight, Hearing, Speech Sight Info: Impaired ?Hearing Info: Adequate ?Speech Info: Adequate  ? ? ?SPECIAL CARE FACTORS FREQUENCY  ?PT (By licensed PT), OT (By licensed OT)   ?  ?PT Frequency: 5x a week ?OT Frequency: 5x a week ?  ?  ?  ?   ? ? ?Contractures Contractures Info: Not present  ? ? ?Additional Factors Info  ?Code  Status, Allergies Code Status Info: Full ?Allergies Info: NKA ?  ?  ?  ?   ? ?Current Medications (11/13/2021):  This is the current hospital active medication list ?acetaminophen 500 MG tablet ?Commonly known as: TYLENOL ?Take 1,000 mg by mouth every 6 (six) hours as needed for moderate pain. ?   ?aspirin EC 81 MG tablet ?Take 81 mg by mouth daily. ?   ?Coenzyme Q10 50 MG Caps ?Take 100 mg by mouth daily. ?   ?  ?HYDROcodone-acetaminophen 5-325 MG tablet ?Commonly known as: NORCO/VICODIN ?Take 1 tablet by mouth every 6 (six) hours as needed for moderate pain. ?   ?loratadine 10 MG tablet ?Commonly known as: CLARITIN ?Take 10 mg by mouth daily. ?   ?multivitamin with minerals Tabs tablet ?Take 1 tablet by mouth daily. ?   ?polyethylene glycol 17 g packet ?Commonly known as: MIRALAX / GLYCOLAX ?Take 17 g by mouth daily. ?   ?senna 8.6 MG Tabs tablet ?Commonly known as: SENOKOT ?Take 1 tablet (8.6 mg total) by mouth 2 (two) times daily. ?   ?Vitamin D (Ergocalciferol) 1.25 MG (50000 UNIT) Caps capsule ?Commonly known as: DRISDOL ?Take 50,000 Units by mouth once a week. ?   ?  ?   ? ? ?Discharge Medications: ?Please see discharge summary for a list of discharge medications. ? ?Relevant Imaging Results: ? ?Relevant Lab Results: ? ? ?Additional Information ?SSN: 11/15/2021 ? ?  Lorri Frederick, LCSW ? ? ? ? ?

## 2021-11-13 NOTE — Progress Notes (Signed)
Gave patient and patient's daughter discharge instructions and scripts, called report to ALF.  ?

## 2021-11-13 NOTE — Plan of Care (Signed)

## 2021-11-13 NOTE — Progress Notes (Signed)
Occupational Therapy Treatment ?Patient Details ?Name: Shelby Chan ?MRN: [id number] ?DOB: Nov 19, 1932 ?Today's Date: 11/13/2021 ? ? ?History of present illness Pt is a 88 y.o. female who presents to the orthopedic department with R hip pain after falling 2 weeks prior. X-rays revealed an impacted right subcapital hip fracture. Pt is s/p right hip screw placement for femoral neck fracture on 3/9.  No significant past medical history. ?  ?OT comments ? Pt making good progress towards goals, supervision level for ADLs and transfers with RW. Pt able to stand statically for ADLs without LOB or external support from RW. Family in room, continued discussion of d/c with HHOT at ALF, family and pt agreeable. Pt presenting with impairments listed below, will follow.   ? ?Recommendations for follow up therapy are one component of a multi-disciplinary discharge planning process, led by the attending physician.  Recommendations may be updated based on patient status, additional functional criteria and insurance authorization. ?   ?Follow Up Recommendations ? Home health OT (at ALF)  ?  ?Assistance Recommended at Discharge Set up Supervision/Assistance  ?Patient can return home with the following ? A little help with walking and/or transfers;A little help with bathing/dressing/bathroom;Assistance with cooking/housework;Assist for transportation;Help with stairs or ramp for entrance ?  ?Equipment Recommendations ? Other (comment) (RW)  ?  ?Recommendations for Other Services   ? ?  ?Precautions / Restrictions Precautions ?Precautions: Fall ?Restrictions ?Weight Bearing Restrictions: Yes ?RLE Weight Bearing: Weight bearing as tolerated  ? ? ?  ? ?Mobility Bed Mobility ?  ?  ?  ?  ?  ?  ?  ?General bed mobility comments: up in room upon arrival ?  ? ?Transfers ?Overall transfer level: Independent ?Equipment used: Rolling walker (2 wheels), None ?Transfers: Sit to/from Stand ?Sit to Stand: Supervision, Min guard ?  ?  ?  ?  ?  ?  ?   ?  ?Balance Overall balance assessment: Needs assistance ?Sitting-balance support: Feet supported ?Sitting balance-Leahy Scale: Normal ?Sitting balance - Comments: pt able to reach outside BOS for LB dressing ?  ?Standing balance support: No upper extremity supported, During functional activity ?Standing balance-Leahy Scale: Good ?Standing balance comment: able to don UB clothing in standing without external support ?  ?  ?  ?  ?  ?  ?  ?  ?  ?  ?  ?   ? ?ADL either performed or assessed with clinical judgement  ? ?ADL Overall ADL's : Needs assistance/impaired ?  ?  ?Grooming: Modified independent;Wash/dry hands;Wash/dry face;Oral care ?Grooming Details (indicate cue type and reason): completed standing at sink ?  ?  ?  ?  ?Upper Body Dressing : Sitting;Supervision/safety ?  ?Lower Body Dressing: Sit to/from stand;Supervision/safety ?  ?Toilet Transfer: Supervision/safety;Rolling walker (2 wheels);Ambulation ?  ?Toileting- Clothing Manipulation and Hygiene: Supervision/safety;Sit to/from stand ?  ?  ?  ?Functional mobility during ADLs: Supervision/safety;Rolling walker (2 wheels) ?  ?  ? ?Extremity/Trunk Assessment Upper Extremity Assessment ?Upper Extremity Assessment: Overall WFL for tasks assessed ?  ?Lower Extremity Assessment ?Lower Extremity Assessment: Defer to PT evaluation ?  ?  ?  ? ?Vision   ?Vision Assessment?: No apparent visual deficits ?  ?Perception Perception ?Perception: Not tested ?  ?Praxis Praxis ?Praxis: Not tested ?  ? ?Cognition Arousal/Alertness: Awake/alert ?Behavior During Therapy: [facility] for tasks assessed/performed ?Overall Cognitive Status: Within Functional Limits for tasks assessed ?  ?  ?  ?  ?  ?  ?  ?  ?  ?  ?  ?  ?  ?  ?  ?  ?  General Comments: family in room to discuss her dc plans ?  ?  ?   ?Exercises   ? ?  ?Shoulder Instructions   ? ? ?  ?General Comments daughter in room during session  ? ? ?Pertinent Vitals/ Pain       Pain Assessment ?Pain Assessment: No/denies pain ? ?Home  Living   ?  ?  ?  ?  ?  ?  ?  ?  ?  ?  ?  ?  ?  ?  ?  ?  ?  ?  ? ?  ?Prior Functioning/Environment    ?  ?  ?  ?   ? ?Frequency ? Min 2X/week  ? ? ? ? ?  ?Progress Toward Goals ? ?OT Goals(current goals can now be found in the care plan section) ? Progress towards OT goals: Progressing toward goals ? ?Acute Rehab OT Goals ?Patient Stated Goal: to get better ?OT Goal Formulation: With patient ?Time For Goal Achievement: 11/23/21 ?Potential to Achieve Goals: Good ?ADL Goals ?Pt Will Perform Upper Body Dressing: with modified independence ?Pt Will Perform Lower Body Dressing: with modified independence;with adaptive equipment ?Pt Will Transfer to Toilet: with modified independence;stand pivot transfer ?Pt Will Perform Toileting - Clothing Manipulation and hygiene: with modified independence;sit to/from stand ?Pt Will Perform Tub/Shower Transfer: with supervision;Tub transfer;tub bench  ?Plan Discharge plan remains appropriate;Frequency remains appropriate   ? ?Co-evaluation ? ? ?   ?  ?  ?  ?  ? ?  ?AM-PAC OT "6 Clicks" Daily Activity     ?Outcome Measure ? ? Help from another person eating meals?: None ?Help from another person taking care of personal grooming?: None ?Help from another person toileting, which includes using toliet, bedpan, or urinal?: None ?Help from another person bathing (including washing, rinsing, drying)?: A Little ?Help from another person to put on and taking off regular upper body clothing?: None ?Help from another person to put on and taking off regular lower body clothing?: A Little ?6 Click Score: 22 ? ?  ?End of Session Equipment Utilized During Treatment: Rolling walker (2 wheels) ? ?OT Visit Diagnosis: Unsteadiness on feet (R26.81);Muscle weakness (generalized) (M62.81) ?  ?Activity Tolerance Patient tolerated treatment well ?  ?Patient Left in chair;with call bell/phone within reach;with family/visitor present ?  ?Nurse Communication Mobility status ?  ? ?   ? ?Time: 1638-4665 ?OT Time  Calculation (min): 16 min ? ?Charges: OT General Charges ?$OT Visit: 1 Visit ?OT Treatments ?$Self Care/Home Management : 8-22 mins ? ?Alfonzo Beers, OTD, OTR/L ?Acute Rehab ?[phone] ? ? ?Mayer Masker ?11/13/2021, 9:59 AM ?

## 2021-11-13 NOTE — Care Plan (Deleted)
Patient is medically stable for discharge to ALF/skilled nursing facility.  No new change in the medical management.  Discharge summary and orders are in. ?

## 2021-11-13 NOTE — Telephone Encounter (Signed)
Spoke to pt's daughter and informed her the social worker should be able to coordinate getting a walker for her mother. However, I emailed a referral order for a walker to [email or web address] for her to take to a DME store, in case there were complications getting the walker  ?

## 2021-11-26 ENCOUNTER — Other Ambulatory Visit (HOSPITAL_BASED_OUTPATIENT_CLINIC_OR_DEPARTMENT_OTHER): Payer: Self-pay | Admitting: Orthopaedic Surgery

## 2021-11-26 DIAGNOSIS — S72001A Fracture of unspecified part of neck of right femur, initial encounter for closed fracture: Secondary | ICD-10-CM

## 2021-11-27 ENCOUNTER — Ambulatory Visit (HOSPITAL_BASED_OUTPATIENT_CLINIC_OR_DEPARTMENT_OTHER)
Admission: RE | Admit: 2021-11-27 | Discharge: 2021-11-27 | Disposition: A | Discharge status: Home / Self Care | Payer: Medicare Other | Source: Ambulatory Visit | Attending: Orthopaedic Surgery | Admitting: Orthopaedic Surgery

## 2021-11-27 ENCOUNTER — Ambulatory Visit (INDEPENDENT_AMBULATORY_CARE_PROVIDER_SITE_OTHER): Payer: Medicare Other | Admitting: Orthopaedic Surgery

## 2021-11-27 DIAGNOSIS — S72001A Fracture of unspecified part of neck of right femur, initial encounter for closed fracture: Secondary | ICD-10-CM | POA: Diagnosis present

## 2021-11-27 DIAGNOSIS — M25561 Pain in right knee: Secondary | ICD-10-CM

## 2021-11-27 NOTE — Progress Notes (Signed)
? ?                            ? ? ?Post Operative Evaluation ?  ? ?Procedure/Date of Surgery: Right hip percutaneous pinning none November 07, 2021 ? ?Interval History:  ? ?Presents today 2 and half weeks status post the above procedure.  She is making significant improvement.  She is now weightbearing as tolerated on the right leg.  She is able to get around her nursing home with a walker quite easily.  She has been working in terms of getting in and out of the bath as well as up and down stairs as her goals ultimately be able to return home.  Really denies any pain in the hip at this time ? ?PMH/PSH/Family History/Social History/Meds/Allergies:   ? ?Past Medical History:  ?Diagnosis Date  ? Arthritis   ? ?Past Surgical History:  ?Procedure Laterality Date  ? CHOLECYSTECTOMY    ? HIP PINNING,CANNULATED Right 11/07/2021  ? Procedure: RIGHT HIP PERCUTANEOUS SCREW FIXATION;  Surgeon: [name], MD;  Location: [facility] OR;  Service: Orthopedics;  Laterality: Right;  ? Right leg fracture s/p repair with 2 pins placed    ? ?Social History  ? ?Socioeconomic History  ? Marital status: Married  ?  Spouse name: Not on file  ? Number of children: Not on file  ? Years of education: Not on file  ? Highest education level: Not on file  ?Occupational History  ? Not on file  ?Tobacco Use  ? Smoking status: Never  ? Smokeless tobacco: Never  ?Vaping Use  ? Vaping Use: Never used  ?Substance and Sexual Activity  ? Alcohol use: Never  ? Drug use: Never  ? Sexual activity: Not on file  ?Other Topics Concern  ? Not on file  ?Social History Narrative  ? Not on file  ? ?Social Determinants of Health  ? ?Financial Resource Strain: Not on file  ?Food Insecurity: Not on file  ?Transportation Needs: Not on file  ?Physical Activity: Not on file  ?Stress: Not on file  ?Social Connections: Not on file  ? ?Family History  ?Problem Relation Age of Onset  ? Heart disease Mother   ? ?No Known Allergies ?Current Outpatient Medications  ?Medication Sig  Dispense Refill  ? acetaminophen (TYLENOL) 500 MG tablet Take 1,000 mg by mouth every 6 (six) hours as needed for moderate pain.    ? aspirin EC 81 MG tablet Take 1 tablet (81 mg total) by mouth 2 (two) times daily. Continue taking once a day after a month 60 tablet 0  ? Coenzyme Q10 50 MG CAPS Take 100 mg by mouth daily.    ? HYDROcodone-acetaminophen (NORCO/VICODIN) 5-325 MG tablet Take 1 tablet by mouth every 6 (six) hours as needed for moderate pain. 10 tablet 0  ? loratadine (CLARITIN) 10 MG tablet Take 10 mg by mouth daily.    ? Multiple Vitamin (MULTIVITAMIN WITH MINERALS) TABS tablet Take 1 tablet by mouth daily.    ? polyethylene glycol (MIRALAX / GLYCOLAX) 17 g packet Take 17 g by mouth daily. 14 each 0  ? senna (SENOKOT) 8.6 MG TABS tablet Take 1 tablet (8.6 mg total) by mouth 2 (two) times daily. 30 tablet 0  ? Vitamin D, Ergocalciferol, (DRISDOL) 1.25 MG (50000 UNIT) CAPS capsule Take 50,000 Units by mouth once a week.    ? ?No current facility-administered medications for this visit.  ? ?No results  found. ? ?Review of Systems:   ?A ROS was performed including pertinent positives and negatives as documented in the HPI. ? ? ?Musculoskeletal Exam:   ? ?There were no vitals taken for this visit. ? ?Right hip lateral incision is well-healing.  No erythema or drainage.  She is able to extend at the right knee while sitting.  She is able to walk with out an antalgic gait.  Sensation intact all distributions.  2+ dorsalis pedis pulse ? ?Imaging:   ? ?X-ray AP pelvis, right hip 2 views: ?Status post percutaneous pinning in good alignment without evidence of complication ? ?I personally reviewed and interpreted the radiographs. ? ? ?Assessment:   ?88 year old female 2 and half weeks status post right hip percutaneous pinning overall doing extremely well.  I would like her to continue to progress her range of motion and do think she would benefit from additional period of time and a skilled nursing home.  She  will continue to progress and work towards possibly getting back home.  I have advised that she should let us know if she is in need of assistance with home health agency as we can assist with this. ? ?Plan :   ? ?-Return to clinic in 4 weeks ? ? ? ? ?I personally saw and evaluated the patient, and participated in the management and treatment plan. ? ?[name], MD ?Attending Physician, Orthopedic Surgery ? ?This document was dictated using Conservation officer, historic buildings. A reasonable attempt at proof reading has been made to minimize errors. ?

## 2021-12-25 ENCOUNTER — Encounter (HOSPITAL_BASED_OUTPATIENT_CLINIC_OR_DEPARTMENT_OTHER): Payer: PRIVATE HEALTH INSURANCE | Admitting: Orthopaedic Surgery

## 2021-12-26 ENCOUNTER — Ambulatory Visit (HOSPITAL_BASED_OUTPATIENT_CLINIC_OR_DEPARTMENT_OTHER)
Admission: RE | Admit: 2021-12-26 | Discharge: 2021-12-26 | Disposition: A | Discharge status: Home / Self Care | Payer: Medicare Other | Source: Ambulatory Visit | Attending: Orthopaedic Surgery | Admitting: Orthopaedic Surgery

## 2021-12-26 ENCOUNTER — Other Ambulatory Visit (HOSPITAL_BASED_OUTPATIENT_CLINIC_OR_DEPARTMENT_OTHER): Payer: Self-pay | Admitting: Orthopaedic Surgery

## 2021-12-26 ENCOUNTER — Ambulatory Visit (INDEPENDENT_AMBULATORY_CARE_PROVIDER_SITE_OTHER): Payer: Medicare Other | Admitting: Orthopaedic Surgery

## 2021-12-26 DIAGNOSIS — S72091D Other fracture of head and neck of right femur, subsequent encounter for closed fracture with routine healing: Secondary | ICD-10-CM | POA: Insufficient documentation

## 2021-12-26 DIAGNOSIS — S72091G Other fracture of head and neck of right femur, subsequent encounter for closed fracture with delayed healing: Secondary | ICD-10-CM

## 2021-12-26 NOTE — Progress Notes (Signed)
? ?                            ? ? ?Post Operative Evaluation ?  ? ?Procedure/Date of Surgery: Right hip percutaneous pinning none November 07, 2021 ? ?Interval History:  ? ?Presents today for follow-up 5 weeks status post right hip percutaneous pinning.  Overall she is doing extremely well.  She has no complaints.  She has essentially returned back to full strength without any assistive devices. ? ?PMH/PSH/Family History/Social History/Meds/Allergies:   ? ?Past Medical History:  ?Diagnosis Date  ? Arthritis   ? ?Past Surgical History:  ?Procedure Laterality Date  ? CHOLECYSTECTOMY    ? HIP PINNING,CANNULATED Right 11/07/2021  ? Procedure: RIGHT HIP PERCUTANEOUS SCREW FIXATION;  Surgeon: Vanetta Mulders, MD;  Location: [facility];  Service: Orthopedics;  Laterality: Right;  ? Right leg fracture s/p repair with 2 pins placed    ? ?Social History  ? ?Socioeconomic History  ? Marital status: Married  ?  Spouse name: Not on file  ? Number of children: Not on file  ? Years of education: Not on file  ? Highest education level: Not on file  ?Occupational History  ? Not on file  ?Tobacco Use  ? Smoking status: Never  ? Smokeless tobacco: Never  ?Vaping Use  ? Vaping Use: Never used  ?Substance and Sexual Activity  ? Alcohol use: Never  ? Drug use: Never  ? Sexual activity: Not on file  ?Other Topics Concern  ? Not on file  ?Social History Narrative  ? Not on file  ? ?Social Determinants of Health  ? ?Financial Resource Strain: Not on file  ?Food Insecurity: Not on file  ?Transportation Needs: Not on file  ?Physical Activity: Not on file  ?Stress: Not on file  ?Social Connections: Not on file  ? ?Family History  ?Problem Relation Age of Onset  ? Heart disease Mother   ? ?No Known Allergies ?Current Outpatient Medications  ?Medication Sig Dispense Refill  ? acetaminophen (TYLENOL) 500 MG tablet Take 1,000 mg by mouth every 6 (six) hours as needed for moderate pain.    ? aspirin EC 81 MG tablet Take 1 tablet (81 mg total) by mouth 2  (two) times daily. Continue taking once a day after a month 60 tablet 0  ? Coenzyme Q10 50 MG CAPS Take 100 mg by mouth daily.    ? HYDROcodone-acetaminophen (NORCO/VICODIN) 5-325 MG tablet Take 1 tablet by mouth every 6 (six) hours as needed for moderate pain. 10 tablet 0  ? loratadine (CLARITIN) 10 MG tablet Take 10 mg by mouth daily.    ? Multiple Vitamin (MULTIVITAMIN WITH MINERALS) TABS tablet Take 1 tablet by mouth daily.    ? polyethylene glycol (MIRALAX / GLYCOLAX) 17 g packet Take 17 g by mouth daily. 14 each 0  ? senna (SENOKOT) 8.6 MG TABS tablet Take 1 tablet (8.6 mg total) by mouth 2 (two) times daily. 30 tablet 0  ? Vitamin D, Ergocalciferol, (DRISDOL) 1.25 MG (50000 UNIT) CAPS capsule Take 50,000 Units by mouth once a week.    ? ?No current facility-administered medications for this visit.  ? ?No results found. ? ?Review of Systems:   ?A ROS was performed including pertinent positives and negatives as documented in the HPI. ? ? ?Musculoskeletal Exam:   ? ?There were no vitals taken for this visit. ? ?Right hip lateral incision is well-healing.  No erythema or drainage.  She is able to extend at the right knee while sitting.  She is able to walk with out an antalgic gait.  Sensation intact all distributions.  2+ dorsalis pedis pulse ? ?Imaging:   ? ?X-ray AP pelvis, right hip 2 views: ?Status post percutaneous pinning in good alignment without evidence of complication with a healed hip ? ?I personally reviewed and interpreted the radiographs. ? ? ?Assessment:   ?88 year old female presents today status post right hip percutaneous pinning, overall doing extremely well.  She will continue outpatient physical therapy ? ?Plan :   ? ?-Return to clinic as needed ? ? ? ? ?I personally saw and evaluated the patient, and participated in the management and treatment plan. ? ?Vanetta Mulders, MD ?Attending Physician, Orthopedic Surgery ? ?This document was dictated using Systems analyst. A  reasonable attempt at proof reading has been made to minimize errors. ?

## 2022-06-10 DIAGNOSIS — Z8781 Personal history of (healed) traumatic fracture: Secondary | ICD-10-CM | POA: Diagnosis not present

## 2022-06-10 DIAGNOSIS — J302 Other seasonal allergic rhinitis: Secondary | ICD-10-CM | POA: Diagnosis not present

## 2022-06-10 DIAGNOSIS — Z7689 Persons encountering health services in other specified circumstances: Secondary | ICD-10-CM | POA: Diagnosis not present

## 2022-06-10 DIAGNOSIS — N281 Cyst of kidney, acquired: Secondary | ICD-10-CM | POA: Diagnosis not present

## 2022-06-23 DIAGNOSIS — N281 Cyst of kidney, acquired: Secondary | ICD-10-CM | POA: Diagnosis not present

## 2022-06-23 DIAGNOSIS — Z8781 Personal history of (healed) traumatic fracture: Secondary | ICD-10-CM | POA: Diagnosis not present

## 2022-06-27 DIAGNOSIS — N281 Cyst of kidney, acquired: Secondary | ICD-10-CM | POA: Insufficient documentation

## 2022-06-30 ENCOUNTER — Other Ambulatory Visit: Payer: Self-pay | Admitting: Family Medicine

## 2022-06-30 ENCOUNTER — Other Ambulatory Visit (HOSPITAL_COMMUNITY): Payer: Self-pay | Admitting: Family Medicine

## 2022-06-30 DIAGNOSIS — N281 Cyst of kidney, acquired: Secondary | ICD-10-CM | POA: Diagnosis not present

## 2022-06-30 DIAGNOSIS — E559 Vitamin D deficiency, unspecified: Secondary | ICD-10-CM | POA: Diagnosis not present

## 2022-06-30 DIAGNOSIS — J302 Other seasonal allergic rhinitis: Secondary | ICD-10-CM | POA: Diagnosis not present

## 2022-06-30 DIAGNOSIS — Z8781 Personal history of (healed) traumatic fracture: Secondary | ICD-10-CM | POA: Diagnosis not present

## 2022-07-15 ENCOUNTER — Ambulatory Visit (HOSPITAL_COMMUNITY)
Admission: RE | Admit: 2022-07-15 | Discharge: 2022-07-15 | Disposition: A | Discharge status: Home / Self Care | Payer: Medicare Other | Source: Ambulatory Visit | Attending: Family Medicine | Admitting: Family Medicine

## 2022-07-15 DIAGNOSIS — N281 Cyst of kidney, acquired: Secondary | ICD-10-CM | POA: Diagnosis not present

## 2023-05-27 ENCOUNTER — Ambulatory Visit (HOSPITAL_BASED_OUTPATIENT_CLINIC_OR_DEPARTMENT_OTHER): Payer: Medicare Other | Admitting: Orthopaedic Surgery

## 2023-05-27 ENCOUNTER — Ambulatory Visit (INDEPENDENT_AMBULATORY_CARE_PROVIDER_SITE_OTHER): Payer: PRIVATE HEALTH INSURANCE

## 2023-05-27 DIAGNOSIS — S72091D Other fracture of head and neck of right femur, subsequent encounter for closed fracture with routine healing: Secondary | ICD-10-CM

## 2023-05-27 DIAGNOSIS — M25551 Pain in right hip: Secondary | ICD-10-CM | POA: Diagnosis not present

## 2023-05-27 DIAGNOSIS — M160 Bilateral primary osteoarthritis of hip: Secondary | ICD-10-CM | POA: Diagnosis not present

## 2023-05-27 NOTE — Progress Notes (Signed)
Post Operative Evaluation    Procedure/Date of Surgery: Right hip percutaneous pinning none November 07, 2021  Interval History:   Presents today for follow-up of the right hip.  She states that she has had weakness with an unstable gait for the last several months.  She is experiencing pain over the lateral trochanter.  She has worked with physical therapy in the past for the right hip.  She is somewhat concerned as she is having a very difficult time going up and down steps.  PMH/PSH/Family History/Social History/Meds/Allergies:    Past Medical History:  Diagnosis Date   Arthritis    Past Surgical History:  Procedure Laterality Date   CHOLECYSTECTOMY     HIP PINNING,CANNULATED Right 11/07/2021   Procedure: RIGHT HIP PERCUTANEOUS SCREW FIXATION;  Surgeon: [name], MD;  Location: [facility] OR;  Service: Orthopedics;  Laterality: Right;   Right leg fracture s/p repair with 2 pins placed     Social History   Socioeconomic History   Marital status: Married    Spouse name: Not on file   Number of children: Not on file   Years of education: Not on file   Highest education level: Not on file  Occupational History   Not on file  Tobacco Use   Smoking status: Never   Smokeless tobacco: Never  Vaping Use   Vaping status: Never Used  Substance and Sexual Activity   Alcohol use: Never   Drug use: Never   Sexual activity: Not on file  Other Topics Concern   Not on file  Social History Narrative   Not on file   Social Determinants of Health   Financial Resource Strain: Not on file  Food Insecurity: Not on file  Transportation Needs: Not on file  Physical Activity: Not on file  Stress: Not on file  Social Connections: Not on file   Family History  Problem Relation Age of Onset   Heart disease Mother    No Known Allergies Current Outpatient Medications  Medication Sig Dispense Refill   acetaminophen (TYLENOL) 500 MG tablet Take 1,000 mg  by mouth every 6 (six) hours as needed for moderate pain.     aspirin EC 81 MG tablet Take 1 tablet (81 mg total) by mouth 2 (two) times daily. Continue taking once a day after a month 60 tablet 0   Coenzyme Q10 50 MG CAPS Take 100 mg by mouth daily.     HYDROcodone-acetaminophen (NORCO/VICODIN) 5-325 MG tablet Take 1 tablet by mouth every 6 (six) hours as needed for moderate pain. 10 tablet 0   loratadine (CLARITIN) 10 MG tablet Take 10 mg by mouth daily.     Multiple Vitamin (MULTIVITAMIN WITH MINERALS) TABS tablet Take 1 tablet by mouth daily.     polyethylene glycol (MIRALAX / GLYCOLAX) 17 g packet Take 17 g by mouth daily. 14 each 0   senna (SENOKOT) 8.6 MG TABS tablet Take 1 tablet (8.6 mg total) by mouth 2 (two) times daily. 30 tablet 0   Vitamin D, Ergocalciferol, (DRISDOL) 1.25 MG (50000 UNIT) CAPS capsule Take 50,000 Units by mouth once a week.     No current facility-administered medications for this visit.   No results found.  Review of Systems:   A ROS was performed including pertinent positives and negatives as documented in  the HPI.   Musculoskeletal Exam:    There were no vitals taken for this visit.  Right hip lateral incision is well-healed.  No erythema or drainage.  She is able to extend at the right knee while sitting.  She does have a Trendelenburg gait.  She has tenderness to palpation of the greater trochanter.  Sensation intact all distributions.  2+ dorsalis pedis pulse  Imaging:    X-ray AP pelvis, right hip 2 views: Status post percutaneous pinning in good alignment without evidence of complication with a healed hip  I personally reviewed and interpreted the radiographs.   Assessment:   89 year old female presents today status post right hip percutaneous pinning today with a healed hip but evidence of a gluteus medius tendon tear in the setting of a Trendelenburg gait and persistent lateral trochanteric based pain.  At this time I have recommended that I  would like to obtain an MRI of the right hip.  We will plan to proceed with this to see if this is secondarily results of her surgery.  Given the fact that she has had a history of falls in the setting of a femoral neck fracture I would like to also send her for an osteoporosis workup with [facility] as well  Plan :    -Plan for MRI right hip and follow-up to discuss results.     I personally saw and evaluated the patient, and participated in the management and treatment plan.  [name], MD Attending Physician, Orthopedic Surgery  This document was dictated using Dragon voice recognition software. A reasonable attempt at proof reading has been made to minimize errors.

## 2023-05-30 ENCOUNTER — Ambulatory Visit
Admission: RE | Admit: 2023-05-30 | Discharge: 2023-05-30 | Disposition: A | Discharge status: Home / Self Care | Payer: Medicare Other | Source: Ambulatory Visit | Attending: Orthopaedic Surgery | Admitting: Orthopaedic Surgery

## 2023-05-30 DIAGNOSIS — G8929 Other chronic pain: Secondary | ICD-10-CM | POA: Diagnosis not present

## 2023-05-30 DIAGNOSIS — S72091D Other fracture of head and neck of right femur, subsequent encounter for closed fracture with routine healing: Secondary | ICD-10-CM

## 2023-05-30 DIAGNOSIS — M25551 Pain in right hip: Secondary | ICD-10-CM | POA: Diagnosis not present

## 2023-06-07 ENCOUNTER — Encounter (HOSPITAL_BASED_OUTPATIENT_CLINIC_OR_DEPARTMENT_OTHER): Payer: Self-pay | Admitting: Orthopaedic Surgery

## 2023-06-08 NOTE — Telephone Encounter (Signed)
Holding for [facility] to see if they want to work pt in sooner

## 2023-06-15 ENCOUNTER — Encounter: Payer: Self-pay | Admitting: Physician Assistant

## 2023-06-15 ENCOUNTER — Ambulatory Visit: Payer: Medicare Other | Admitting: Physician Assistant

## 2023-06-15 VITALS — Ht 63.0 in | Wt 167.0 lb

## 2023-06-15 DIAGNOSIS — M810 Age-related osteoporosis without current pathological fracture: Secondary | ICD-10-CM | POA: Diagnosis not present

## 2023-06-15 NOTE — Progress Notes (Signed)
Office Visit Note   Patient: Shelby Chan           Date of Birth: 06-30-1933           MRN: [id number] Visit Date: 06/15/2023              Requested by: [name], MD [id number] [place],  [place] [address] PCP: Dorice Lamas, MD   Assessment & Plan: Visit Diagnoses:  1. Age-related osteoporosis without current pathological fracture     Plan: Shelby Chan is a pleasant active 89 year old woman who is accompanied by her daughter today.  She has a history of a right hip fracture which was fixed by Dr. Ballard Russell.  She also has a history of loss of height by 2 inches.  She is here to be evaluated for osteoporosis treatment.  She lives independently in a senior apartment.  She is never taken medications for osteoporosis in the past.  Her hip fracture was a little over a year ago.  She has no history of heart attack heart disease or stroke.  She has never had cancer or kidney disease or an ulcer.  She has never had gastric bypass or reflux.  No history of seizure.  She said she was 72 when she went through menopause.Her BMI is 29.  She is unsure of her calcium but has taken a Centrum Silver 50+.  Has 1000 international units of vitamin D and 300 mg of calcium.  She is never done hormone replacement therapy.  She is a nondrinker and a non-smoker.  She walks daily but since her hip she has had some difficulty with pain in the right hip secondary to some type of tendinitis from what I gather she currently has an MRI she is going to go over with Dr. Steward Drone.  He does have longevity that runs in her family with her mother living to be over 100.  Given her active lifestyle and history of fragility fractures she is diagnosed with osteoporosis.  She has never had any lab work will go forward with this today.  Also will refer her for a bone density scan.  She will follow-up with me once these are completed.  I have given her and her daughter information regarding different medications  available for osteoporosis.  I spent 30 to 45 minutes counseling them on nonpharmaceutical approaches to improving bone density including strength training as well as information regarding calcium and vitamin D supplementation.  We discussed the side effects of these medications in detail.  Discussed the reasoning for the labs we are ordering.  All questions were answered  Follow-Up Instructions: No follow-ups on file.   Orders:  No orders of the defined types were placed in this encounter.  No orders of the defined types were placed in this encounter.     Procedures: No procedures performed   Clinical Data: No additional findings.   Subjective: Chief Complaint  Patient presents with   Osteoporosis    HPI Shelby Chan presents today with a chief complaint of history of fragility fractures and height loss.  She is a year and a half status post right hip pinning and has had a height loss of 2 inches.  She is otherwise very healthy and active and still drives herself.  She lives in an apartment complex in a senior center.  She is accompanied by her daughter today.  Review of Systems  All other systems reviewed and are negative.    Objective: Vital  Signs: There were no vitals taken for this visit.  Physical Exam Constitutional:      Appearance: Normal appearance.  Pulmonary:     Effort: Pulmonary effort is normal.  Skin:    General: Skin is warm and dry.  Neurological:     General: No focal deficit present.     Mental Status: She is alert and oriented to person, place, and time.  Psychiatric:        Mood and Affect: Mood normal.        Behavior: Behavior normal.     Ortho Exam  Specialty Comments:  No specialty comments available.  Imaging: No results found.   PMFS History: Patient Active Problem List   Diagnosis Date Noted   Age-related osteoporosis without current pathological fracture 06/15/2023   Closed right hip fracture (HCC) 11/06/2021   Impacted  fracture of right hip (HCC) secondary to fall 11/06/2021   Fall 11/06/2021   Pain in left ankle and joints of left foot 06/12/2021   Pain in left leg 04/13/2019   Past Medical History:  Diagnosis Date   Arthritis     Family History  Problem Relation Age of Onset   Heart disease Mother     Past Surgical History:  Procedure Laterality Date   CHOLECYSTECTOMY     HIP PINNING,CANNULATED Right 11/07/2021   Procedure: RIGHT HIP PERCUTANEOUS SCREW FIXATION;  Surgeon: [name], MD;  Location: [facility] OR;  Service: Orthopedics;  Laterality: Right;   Right leg fracture s/p repair with 2 pins placed     Social History   Occupational History   Not on file  Tobacco Use   Smoking status: Never   Smokeless tobacco: Never  Vaping Use   Vaping status: Never Used  Substance and Sexual Activity   Alcohol use: Never   Drug use: Never   Sexual activity: Not on file

## 2023-06-18 LAB — EXTRA SPECIMEN

## 2023-06-18 LAB — CBC WITH DIFFERENTIAL/PLATELET
Absolute Lymphocytes: 2471 {cells}/uL (ref 850–3900)
Absolute Monocytes: 630 {cells}/uL (ref 200–950)
Basophils Absolute: 42 {cells}/uL (ref 0–200)
Basophils Relative: 0.6 %
Eosinophils Absolute: 189 {cells}/uL (ref 15–500)
Eosinophils Relative: 2.7 %
HCT: 44 % (ref 35.0–45.0)
Hemoglobin: 14.6 g/dL (ref 11.7–15.5)
MCH: 31.1 pg (ref 27.0–33.0)
MCHC: 33.2 g/dL (ref 32.0–36.0)
MCV: 93.8 fL (ref 80.0–100.0)
MPV: 9.7 fL (ref 7.5–12.5)
Monocytes Relative: 9 %
Neutro Abs: 3668 {cells}/uL (ref 1500–7800)
Neutrophils Relative %: 52.4 %
Platelets: 269 10*3/uL (ref 140–400)
RBC: 4.69 10*6/uL (ref 3.80–5.10)
RDW: 11.9 % (ref 11.0–15.0)
Total Lymphocyte: 35.3 %
WBC: 7 10*3/uL (ref 3.8–10.8)

## 2023-06-18 LAB — VITAMIN D 25 HYDROXY (VIT D DEFICIENCY, FRACTURES): Vit D, 25-Hydroxy: 20 ng/mL — ABNORMAL LOW (ref 30–100)

## 2023-06-18 LAB — PROTEIN ELECTROPHORESIS, SERUM
Albumin ELP: 4.5 g/dL (ref 3.8–4.8)
Alpha 1: 0.3 g/dL (ref 0.2–0.3)
Alpha 2: 0.8 g/dL (ref 0.5–0.9)
Beta 2: 0.3 g/dL (ref 0.2–0.5)
Beta Globulin: 0.4 g/dL (ref 0.4–0.6)
Gamma Globulin: 0.8 g/dL (ref 0.8–1.7)
Total Protein: 7 g/dL (ref 6.1–8.1)

## 2023-06-18 LAB — TSH: TSH: 2.28 m[IU]/L (ref 0.40–4.50)

## 2023-06-18 LAB — COMPREHENSIVE METABOLIC PANEL
AG Ratio: 1.6 (calc) (ref 1.0–2.5)
ALT: 19 U/L (ref 6–29)
AST: 18 U/L (ref 10–35)
Albumin: 4.4 g/dL (ref 3.6–5.1)
Alkaline phosphatase (APISO): 67 U/L (ref 37–153)
BUN: 18 mg/dL (ref 7–25)
CO2: 24 mmol/L (ref 20–32)
Calcium: 10 mg/dL (ref 8.6–10.4)
Chloride: 102 mmol/L (ref 98–110)
Creat: 0.87 mg/dL (ref 0.60–0.95)
Globulin: 2.7 g/dL (ref 1.9–3.7)
Glucose, Bld: 101 mg/dL — ABNORMAL HIGH (ref 65–99)
Potassium: 4.4 mmol/L (ref 3.5–5.3)
Sodium: 141 mmol/L (ref 135–146)
Total Bilirubin: 0.7 mg/dL (ref 0.2–1.2)
Total Protein: 7.1 g/dL (ref 6.1–8.1)

## 2023-06-18 LAB — PARATHYROID HORMONE, INTACT (NO CA): PTH: 37 pg/mL (ref 16–77)

## 2023-06-19 ENCOUNTER — Ambulatory Visit (HOSPITAL_BASED_OUTPATIENT_CLINIC_OR_DEPARTMENT_OTHER): Payer: Medicare Other | Admitting: Orthopaedic Surgery

## 2023-06-19 ENCOUNTER — Ambulatory Visit (HOSPITAL_BASED_OUTPATIENT_CLINIC_OR_DEPARTMENT_OTHER)
Admission: RE | Admit: 2023-06-19 | Discharge: 2023-06-19 | Disposition: A | Discharge status: Home / Self Care | Payer: Medicare Other | Source: Ambulatory Visit | Attending: Physician Assistant | Admitting: Physician Assistant

## 2023-06-19 ENCOUNTER — Other Ambulatory Visit (HOSPITAL_BASED_OUTPATIENT_CLINIC_OR_DEPARTMENT_OTHER): Payer: Self-pay

## 2023-06-19 DIAGNOSIS — Z780 Asymptomatic menopausal state: Secondary | ICD-10-CM | POA: Diagnosis not present

## 2023-06-19 DIAGNOSIS — S72091G Other fracture of head and neck of right femur, subsequent encounter for closed fracture with delayed healing: Secondary | ICD-10-CM

## 2023-06-19 DIAGNOSIS — M810 Age-related osteoporosis without current pathological fracture: Secondary | ICD-10-CM | POA: Insufficient documentation

## 2023-06-19 DIAGNOSIS — M85832 Other specified disorders of bone density and structure, left forearm: Secondary | ICD-10-CM | POA: Diagnosis not present

## 2023-06-19 DIAGNOSIS — M25551 Pain in right hip: Secondary | ICD-10-CM | POA: Diagnosis not present

## 2023-06-19 MED ORDER — FLUAD 0.5 ML IM SUSY
0.5000 mL | PREFILLED_SYRINGE | Freq: Once | INTRAMUSCULAR | 0 refills | Status: AC
  Filled 2023-06-19: qty 0.5, 1d supply, fill #0

## 2023-06-19 MED ORDER — TRIAMCINOLONE ACETONIDE 40 MG/ML IJ SUSP
80.0000 mg | INTRAMUSCULAR | Status: AC | PRN
Start: 2023-06-19 — End: 2023-06-19
  Administered 2023-06-19: 80 mg via INTRA_ARTICULAR

## 2023-06-19 MED ORDER — LIDOCAINE HCL 1 % IJ SOLN
4.0000 mL | INTRAMUSCULAR | Status: AC | PRN
Start: 2023-06-19 — End: 2023-06-19
  Administered 2023-06-19: 4 mL

## 2023-06-19 NOTE — Progress Notes (Signed)
Post Operative Evaluation    Procedure/Date of Surgery: Right hip percutaneous pinning none November 07, 2021  Interval History:   Presents today for follow-up of the right hip.  Presents today for further discussion and MRI follow-up of the right hip. PMH/PSH/Family History/Social History/Meds/Allergies:    Past Medical History:  Diagnosis Date  . Arthritis    Past Surgical History:  Procedure Laterality Date  . CHOLECYSTECTOMY    . HIP PINNING,CANNULATED Right 11/07/2021   Procedure: RIGHT HIP PERCUTANEOUS SCREW FIXATION;  Surgeon: [name], MD;  Location: [facility] OR;  Service: Orthopedics;  Laterality: Right;  . Right leg fracture s/p repair with 2 pins placed     Social History   Socioeconomic History  . Marital status: Married    Spouse name: Not on file  . Number of children: Not on file  . Years of education: Not on file  . Highest education level: Not on file  Occupational History  . Not on file  Tobacco Use  . Smoking status: Never  . Smokeless tobacco: Never  Vaping Use  . Vaping status: Never Used  Substance and Sexual Activity  . Alcohol use: Never  . Drug use: Never  . Sexual activity: Not on file  Other Topics Concern  . Not on file  Social History Narrative  . Not on file   Social Determinants of Health   Financial Resource Strain: Not on file  Food Insecurity: Not on file  Transportation Needs: Not on file  Physical Activity: Not on file  Stress: Not on file  Social Connections: Not on file   Family History  Problem Relation Age of Onset  . Heart disease Mother    No Known Allergies Current Outpatient Medications  Medication Sig Dispense Refill  . acetaminophen (TYLENOL) 500 MG tablet Take 1,000 mg by mouth every 6 (six) hours as needed for moderate pain.    Marland Kitchen aspirin EC 81 MG tablet Take 1 tablet (81 mg total) by mouth 2 (two) times daily. Continue taking once a day after a month 60 tablet 0  . Coenzyme  Q10 50 MG CAPS Take 100 mg by mouth daily.    . influenza vaccine adjuvanted (FLUAD) 0.5 ML injection Inject 0.5 mLs into the muscle once for 1 dose. 0.5 mL 0  . loratadine (CLARITIN) 10 MG tablet Take 10 mg by mouth daily.    . Multiple Vitamin (MULTIVITAMIN WITH MINERALS) TABS tablet Take 1 tablet by mouth daily.     No current facility-administered medications for this visit.   No results found.  Review of Systems:   A ROS was performed including pertinent positives and negatives as documented in the HPI.   Musculoskeletal Exam:    There were no vitals taken for this visit.  Right hip lateral incision is well-healed.  No erythema or drainage.  She is able to extend at the right knee while sitting.  She does have a Trendelenburg gait.  She has tenderness to palpation of the greater trochanter.  Sensation intact all distributions.  2+ dorsalis pedis pulse  Imaging:    X-ray AP pelvis, right hip 2 views: Status post percutaneous pinning in good alignment without evidence of complication with a healed hip  MRI right hip: Healed femoral neck fracture with evidence of gluteus medius minimus tearing  I  personally reviewed and interpreted the radiographs.   Assessment:   89 year old female presents today status post right hip percutaneous pinning today with a healed hip but evidence of a gluteus medius tendon tear in the setting of a Trendelenburg gait and persistent lateral trochanteric based pain.  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  Given this I would like to enroll her in physical therapy so that she can begin working on hip and core strengthening.  I have also recommended a diagnostic and hopefully therapeutic ultrasound-guided injection of gluteus medius at today's visit.  I will plan to see her back in 2 months for reassessment  Plan :    -Right hip ultrasound-guided injection provided after verbal consent  obtained    Procedure Note  Patient: Shelby Chan             Date of Birth: 08/16/1933           MRN: [id number]             Visit Date: 06/19/2023  Procedures: Visit Diagnoses:  1. Closed impacted fracture of right hip with delayed healing, subsequent encounter     Large Joint Inj: R greater trochanter on 06/19/2023 11:38 AM Indications: pain Details: 22 G 3.5 in needle, ultrasound-guided anterolateral approach  Arthrogram: No  Medications: 4 mL lidocaine 1 %; 80 mg triamcinolone acetonide 40 MG/ML Outcome: tolerated well, no immediate complications Procedure, treatment alternatives, risks and benefits explained, specific risks discussed. Consent was given by the patient. Immediately prior to procedure a time out was called to verify the correct patient, procedure, equipment, support staff and site/side marked as required. Patient was prepped and draped in the usual sterile fashion.        I personally saw and evaluated the patient, and participated in the management and treatment plan.  [name], MD Attending Physician, Orthopedic Surgery  This document was dictated using Dragon voice recognition software. A reasonable attempt at proof reading has been made to minimize errors.

## 2023-06-23 ENCOUNTER — Telehealth: Payer: Self-pay | Admitting: Physician Assistant

## 2023-06-23 NOTE — Telephone Encounter (Signed)
Called pt number on file Rosalita Chessman (daughter) pt need to set an appt to go over her Dexa scan with [facility] Persons for a 45 min OV

## 2023-06-24 ENCOUNTER — Ambulatory Visit (HOSPITAL_BASED_OUTPATIENT_CLINIC_OR_DEPARTMENT_OTHER): Payer: PRIVATE HEALTH INSURANCE | Admitting: Orthopaedic Surgery

## 2023-06-26 ENCOUNTER — Encounter: Payer: Self-pay | Admitting: Physician Assistant

## 2023-06-26 ENCOUNTER — Telehealth: Payer: Self-pay

## 2023-06-26 ENCOUNTER — Ambulatory Visit: Payer: Medicare Other | Admitting: Physician Assistant

## 2023-06-26 DIAGNOSIS — M810 Age-related osteoporosis without current pathological fracture: Secondary | ICD-10-CM | POA: Diagnosis not present

## 2023-06-26 NOTE — Telephone Encounter (Signed)
Can you please get approval for Evinity? MaryAnne's patient. Thanks!

## 2023-06-29 NOTE — Progress Notes (Signed)
Office Visit Note   Patient: Shelby Chan           Date of Birth: 1933-05-21           MRN: [id number] Visit Date: 06/26/2023              Requested by: Dorice Lamas, MD [address] [place],  [place] [address] PCP: Dorice Lamas, MD  Chief Complaint  Patient presents with   Osteoporosis    Dexa Scan Review      HPI: Shelby Chan is a pleasant 89 year old woman who is accompanied by her daughter today.  She recently had a workup for osteoporosis including labs as well as a DEXA scan.  She does have a history of 2 inch height loss as well as a hip fracture being treated by Dr. Steward Drone  Assessment & Plan: Visit Diagnoses:  1. Age-related osteoporosis without current pathological fracture     Plan: Reviewed all her results with her and her daughter.  Would like her to increase her vitamin D to 5000 international units for a month.  Knows to stop if she has any side effects.  Her T-score was -2.2 which puts her into almost the osteoporosis category however this was only a wrist reading as she has had hip fracture as well as degenerative changes and loss of height in her lumbar spine.  Because of this a FRAX score was not able to be calculated.  Given her height loss and fragility fracture I do know she qualifies is osteoporotic.  She is extremely healthy and has direct relatives that live to be over 100.  Had a long discussion with options.  They would like to pursue trying to get Evenity approved.  Discussed the follow-up with regards to this as well as the side effects.  Will go forward with authorization  Follow-Up Instructions: No follow-ups on file.   Ortho Exam  Patient is alert, oriented, no adenopathy, well-dressed, normal affect, normal respiratory effort.   Imaging: No results found. No images are attached to the encounter.  Labs: No results found for: "HGBA1C", "ESRSEDRATE", "CRP", "LABURIC", "REPTSTATUS", "GRAMSTAIN", "CULT",  "LABORGA"   No results found for: "ALBUMIN", "PREALBUMIN", "CBC"  No results found for: "MG" Lab Results  Component Value Date   VD25OH 20 (L) 06/15/2023    No results found for: "PREALBUMIN"    Latest Ref Rng & Units 06/15/2023    9:39 AM 11/09/2021    7:59 AM 11/08/2021    3:06 AM  CBC EXTENDED  WBC 3.8 - 10.8 Thousand/uL 7.0  9.0  10.7   RBC 3.80 - 5.10 Million/uL 4.69  4.25  4.13   Hemoglobin 11.7 - 15.5 g/dL 13.2  44.0  10.2   HCT 35.0 - 45.0 % 44.0  38.8  37.5   Platelets 140 - 400 Thousand/uL 269  209  214   NEUT# 1,500 - 7,800 cells/uL 3,668  5.0    Lymph# 0.7 - 4.0 K/uL  3.1       There is no height or weight on file to calculate BMI.  Orders:  No orders of the defined types were placed in this encounter.  No orders of the defined types were placed in this encounter.    Procedures: No procedures performed  Clinical Data: No additional findings.  ROS:  All other systems negative, except as noted in the HPI. Review of Systems  Objective: Vital Signs: There were no vitals taken for this visit.  Specialty  Comments:  No specialty comments available.  PMFS History: Patient Active Problem List   Diagnosis Date Noted   Age-related osteoporosis without current pathological fracture 06/15/2023   Closed right hip fracture (HCC) 11/06/2021   Impacted fracture of right hip (HCC) secondary to fall 11/06/2021   Fall 11/06/2021   Pain in left ankle and joints of left foot 06/12/2021   Pain in left leg 04/13/2019   Past Medical History:  Diagnosis Date   Arthritis     Family History  Problem Relation Age of Onset   Heart disease Mother     Past Surgical History:  Procedure Laterality Date   CHOLECYSTECTOMY     HIP PINNING,CANNULATED Right 11/07/2021   Procedure: RIGHT HIP PERCUTANEOUS SCREW FIXATION;  Surgeon: [name], MD;  Location: [facility] OR;  Service: Orthopedics;  Laterality: Right;   Right leg fracture s/p repair with 2 pins placed     Social  History   Occupational History   Not on file  Tobacco Use   Smoking status: Never   Smokeless tobacco: Never  Vaping Use   Vaping status: Never Used  Substance and Sexual Activity   Alcohol use: Never   Drug use: Never   Sexual activity: Not on file

## 2023-06-29 NOTE — Telephone Encounter (Signed)
Submitted for [employer] through [employer]

## 2023-07-03 ENCOUNTER — Encounter (HOSPITAL_BASED_OUTPATIENT_CLINIC_OR_DEPARTMENT_OTHER): Payer: Self-pay | Admitting: Orthopaedic Surgery

## 2023-07-03 ENCOUNTER — Telehealth (HOSPITAL_BASED_OUTPATIENT_CLINIC_OR_DEPARTMENT_OTHER): Payer: Self-pay

## 2023-07-03 DIAGNOSIS — M6281 Muscle weakness (generalized): Secondary | ICD-10-CM | POA: Diagnosis not present

## 2023-07-03 DIAGNOSIS — R269 Unspecified abnormalities of gait and mobility: Secondary | ICD-10-CM | POA: Diagnosis not present

## 2023-07-03 NOTE — Telephone Encounter (Signed)
Therapy called and wanted to know pt's WB status. Pt was told per Maurine Cane that pt is WBAT.

## 2023-07-07 DIAGNOSIS — R269 Unspecified abnormalities of gait and mobility: Secondary | ICD-10-CM | POA: Diagnosis not present

## 2023-07-07 DIAGNOSIS — M6281 Muscle weakness (generalized): Secondary | ICD-10-CM | POA: Diagnosis not present

## 2023-07-14 DIAGNOSIS — R269 Unspecified abnormalities of gait and mobility: Secondary | ICD-10-CM | POA: Diagnosis not present

## 2023-07-14 DIAGNOSIS — M6281 Muscle weakness (generalized): Secondary | ICD-10-CM | POA: Diagnosis not present

## 2023-07-17 DIAGNOSIS — R269 Unspecified abnormalities of gait and mobility: Secondary | ICD-10-CM | POA: Diagnosis not present

## 2023-07-17 DIAGNOSIS — M6281 Muscle weakness (generalized): Secondary | ICD-10-CM | POA: Diagnosis not present

## 2023-07-20 DIAGNOSIS — R269 Unspecified abnormalities of gait and mobility: Secondary | ICD-10-CM | POA: Diagnosis not present

## 2023-07-20 DIAGNOSIS — M6281 Muscle weakness (generalized): Secondary | ICD-10-CM | POA: Diagnosis not present

## 2023-07-22 DIAGNOSIS — R269 Unspecified abnormalities of gait and mobility: Secondary | ICD-10-CM | POA: Diagnosis not present

## 2023-07-22 DIAGNOSIS — M6281 Muscle weakness (generalized): Secondary | ICD-10-CM | POA: Diagnosis not present

## 2023-07-27 ENCOUNTER — Telehealth: Payer: Self-pay

## 2023-07-27 NOTE — Telephone Encounter (Signed)
Talked with patients daughter and advised her about Evenity and Prolia pricing and per patients daughter, she would like to wait until the new year due to patient's insurance changing.  Will resubmit after 09/04/2023.

## 2023-08-10 DIAGNOSIS — M6281 Muscle weakness (generalized): Secondary | ICD-10-CM | POA: Diagnosis not present

## 2023-08-10 DIAGNOSIS — R269 Unspecified abnormalities of gait and mobility: Secondary | ICD-10-CM | POA: Diagnosis not present

## 2023-08-11 DIAGNOSIS — Z0001 Encounter for general adult medical examination with abnormal findings: Secondary | ICD-10-CM | POA: Diagnosis not present

## 2023-08-11 DIAGNOSIS — J302 Other seasonal allergic rhinitis: Secondary | ICD-10-CM | POA: Diagnosis not present

## 2023-08-11 DIAGNOSIS — R32 Unspecified urinary incontinence: Secondary | ICD-10-CM | POA: Insufficient documentation

## 2023-08-11 DIAGNOSIS — M25551 Pain in right hip: Secondary | ICD-10-CM

## 2023-08-11 DIAGNOSIS — Z0000 Encounter for general adult medical examination without abnormal findings: Secondary | ICD-10-CM | POA: Diagnosis not present

## 2023-08-11 DIAGNOSIS — Z8781 Personal history of (healed) traumatic fracture: Secondary | ICD-10-CM | POA: Diagnosis not present

## 2023-08-12 DIAGNOSIS — R269 Unspecified abnormalities of gait and mobility: Secondary | ICD-10-CM | POA: Diagnosis not present

## 2023-08-12 DIAGNOSIS — M6281 Muscle weakness (generalized): Secondary | ICD-10-CM | POA: Diagnosis not present

## 2023-08-21 ENCOUNTER — Ambulatory Visit (HOSPITAL_BASED_OUTPATIENT_CLINIC_OR_DEPARTMENT_OTHER): Payer: Medicare Other | Admitting: Orthopaedic Surgery

## 2023-08-21 ENCOUNTER — Ambulatory Visit (HOSPITAL_BASED_OUTPATIENT_CLINIC_OR_DEPARTMENT_OTHER): Payer: Self-pay | Admitting: Orthopaedic Surgery

## 2023-08-21 DIAGNOSIS — M7631 Iliotibial band syndrome, right leg: Secondary | ICD-10-CM

## 2023-08-21 NOTE — Progress Notes (Signed)
Post Operative Evaluation    Procedure/Date of Surgery: Right hip percutaneous pinning none November 07, 2021  Interval History:   Presents today for follow-up of the right hip.  She does state that she did get some initial relief from her injection.  This is subsequently worn off.  At this point she is having a very difficult time playing with her grandchildren are being active.  She is now rest back to using a walker   PMH/PSH/Family History/Social History/Meds/Allergies:    Past Medical History:  Diagnosis Date   Arthritis    Past Surgical History:  Procedure Laterality Date   CHOLECYSTECTOMY     HIP PINNING,CANNULATED Right 11/07/2021   Procedure: RIGHT HIP PERCUTANEOUS SCREW FIXATION;  Surgeon: [name], MD;  Location: [facility] OR;  Service: Orthopedics;  Laterality: Right;   Right leg fracture s/p repair with 2 pins placed     Social History   Socioeconomic History   Marital status: Married    Spouse name: Not on file   Number of children: Not on file   Years of education: Not on file   Highest education level: Not on file  Occupational History   Not on file  Tobacco Use   Smoking status: Never   Smokeless tobacco: Never  Vaping Use   Vaping status: Never Used  Substance and Sexual Activity   Alcohol use: Never   Drug use: Never   Sexual activity: Not on file  Other Topics Concern   Not on file  Social History Narrative   Not on file   Social Drivers of Health   Financial Resource Strain: Not on file  Food Insecurity: Not on file  Transportation Needs: Not on file  Physical Activity: Not on file  Stress: Not on file  Social Connections: Not on file   Family History  Problem Relation Age of Onset   Heart disease Mother    No Known Allergies Current Outpatient Medications  Medication Sig Dispense Refill   acetaminophen (TYLENOL) 500 MG tablet Take 1,000 mg by mouth every 6 (six) hours as needed for moderate pain.      aspirin EC 81 MG tablet Take 1 tablet (81 mg total) by mouth 2 (two) times daily. Continue taking once a day after a month 60 tablet 0   Coenzyme Q10 50 MG CAPS Take 100 mg by mouth daily.     loratadine (CLARITIN) 10 MG tablet Take 10 mg by mouth daily.     Multiple Vitamin (MULTIVITAMIN WITH MINERALS) TABS tablet Take 1 tablet by mouth daily.     No current facility-administered medications for this visit.   No results found.  Review of Systems:   A ROS was performed including pertinent positives and negatives as documented in the HPI.   Musculoskeletal Exam:    There were no vitals taken for this visit.  Right hip lateral incision is well-healed.  No erythema or drainage.  She is able to extend at the right knee while sitting.  She does have a Trendelenburg gait.  She has tenderness to palpation of the greater trochanter.  Sensation intact all distributions.  2+ dorsalis pedis pulse  Imaging:    X-ray AP pelvis, right hip 2 views: Status post percutaneous pinning in good alignment without evidence of complication with a healed hip  MRI  right hip: Healed femoral neck fracture with evidence of gluteus medius minimus tearing  I personally reviewed and interpreted the radiographs.   Assessment:   89 year old female presents today status post right hip percutaneous pinning today with a healed hip but evidence of a gluteus medius tendon tear in the setting of a Trendelenburg gait and persistent lateral trochanteric based pain.  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  At this time she has trialed physical therapy as well as an injection of the right hip.  Injection did give her transient relief although this is subsequently worn off.  At this time she has regressed and is now back to using a walker and has not been able to be active with her grandchildren.  Given this I did discuss the possibility of an IT band release with the  gluteus medius repair for symptomatic hardware laterally.  After discussion with her and her daughter she is ultimately elected for this  Plan :    -Plan for right gluteus medius repair with IT band lengthening   After a lengthy discussion of treatment options, including risks, benefits, alternatives, complications of surgical and nonsurgical conservative options, the patient elected surgical repair.   The patient  is aware of the material risks  and complications including, but not limited to injury to adjacent structures, neurovascular injury, infection, numbness, bleeding, implant failure, thermal burns, stiffness, persistent pain, failure to heal, disease transmission from allograft, need for further surgery, dislocation, anesthetic risks, blood clots, risks of death,and others. The probabilities of surgical success and failure discussed with patient given their particular co-morbidities.The time and nature of expected rehabilitation and recovery was discussed.The patient's questions were all answered preoperatively.  No barriers to understanding were noted. I explained the natural history of the disease process and Rx rationale.  I explained to the patient what I considered to be reasonable expectations given their personal situation.  The final treatment plan was arrived at through a shared patient decision making process model.     I personally saw and evaluated the patient, and participated in the management and treatment plan.  [name], MD Attending Physician, Orthopedic Surgery  This document was dictated using Dragon voice recognition software. A reasonable attempt at proof reading has been made to minimize errors.

## 2023-09-03 ENCOUNTER — Encounter (HOSPITAL_BASED_OUTPATIENT_CLINIC_OR_DEPARTMENT_OTHER): Payer: Self-pay | Admitting: Orthopaedic Surgery

## 2023-09-09 NOTE — Telephone Encounter (Signed)
 done

## 2023-09-14 ENCOUNTER — Encounter (HOSPITAL_BASED_OUTPATIENT_CLINIC_OR_DEPARTMENT_OTHER): Payer: Self-pay | Admitting: Orthopaedic Surgery

## 2023-09-14 ENCOUNTER — Other Ambulatory Visit (HOSPITAL_BASED_OUTPATIENT_CLINIC_OR_DEPARTMENT_OTHER): Payer: Self-pay | Admitting: Orthopaedic Surgery

## 2023-09-14 DIAGNOSIS — M67959 Unspecified disorder of synovium and tendon, unspecified thigh: Secondary | ICD-10-CM

## 2023-09-15 ENCOUNTER — Other Ambulatory Visit (HOSPITAL_BASED_OUTPATIENT_CLINIC_OR_DEPARTMENT_OTHER): Payer: Self-pay | Admitting: Orthopaedic Surgery

## 2023-09-15 DIAGNOSIS — M67959 Unspecified disorder of synovium and tendon, unspecified thigh: Secondary | ICD-10-CM

## 2023-09-16 NOTE — Telephone Encounter (Signed)
 I spoke with the patient's daughter 1/13. The patient has been scheduled for surgery on 09/24/23.

## 2023-09-22 ENCOUNTER — Encounter (HOSPITAL_COMMUNITY): Payer: Self-pay | Admitting: Orthopaedic Surgery

## 2023-09-23 ENCOUNTER — Encounter (HOSPITAL_COMMUNITY): Payer: Self-pay | Admitting: Orthopaedic Surgery

## 2023-09-23 ENCOUNTER — Other Ambulatory Visit: Payer: Self-pay

## 2023-09-23 NOTE — Anesthesia Preprocedure Evaluation (Signed)
Anesthesia Evaluation  Patient identified by MRN, date of birth, ID band Patient awake    Reviewed: Allergy & Precautions, NPO status , Patient's Chart, lab work & pertinent test results  History of Anesthesia Complications (+) PONV and history of anesthetic complications  Airway Mallampati: II  TM Distance: >3 FB Neck ROM: Full    Dental  (+) Teeth Intact, Dental Advisory Given, Poor Dentition   Pulmonary neg pulmonary ROS   breath sounds clear to auscultation       Cardiovascular negative cardio ROS  Rhythm:Regular     Neuro/Psych negative neurological ROS  negative psych ROS   GI/Hepatic Neg liver ROS,GERD  Medicated,,  Endo/Other  negative endocrine ROS    Renal/GU negative Renal ROS     Musculoskeletal  (+) Arthritis , Osteoarthritis,  Right hip fracture   Abdominal   Peds  Hematology negative hematology ROS (+)   Anesthesia Other Findings   Reproductive/Obstetrics                             Anesthesia Physical Anesthesia Plan  ASA: 3  Anesthesia Plan: General   Post-op Pain Management: Tylenol PO (pre-op)*   Induction: Intravenous  PONV Risk Score and Plan: 3 and Treatment may vary due to age or medical condition, Ondansetron, Dexamethasone and Propofol infusion  Airway Management Planned: Oral ETT  Additional Equipment: None  Intra-op Plan:   Post-operative Plan: Extubation in OR  Informed Consent: I have reviewed the patients History and Physical, chart, labs and discussed the procedure including the risks, benefits and alternatives for the proposed anesthesia with the patient or authorized representative who has indicated his/her understanding and acceptance.     Dental advisory given  Plan Discussed with: Anesthesiologist and CRNA  Anesthesia Plan Comments:         Anesthesia Quick Evaluation

## 2023-09-23 NOTE — Progress Notes (Signed)
SDW call  Patient's daughter, Rosalita Chessman was given pre-op instructions over the phone. She verbalized understanding of instructions provided.     PCP - Dr Nita Sells Cardiologist -  Pulmonary:    PPM/ICD - denies Device Orders - na Rep Notified - na   Chest x-ray - na EKG -  na Stress Test - ECHO -  Cardiac Cath -   Sleep Study/sleep apnea/CPAP: denies  Non-diabetic  Blood Thinner Instructions: denies Aspirin Instructions:states last dose 09/23/2023   ERAS Protcol - Clears until 0930   Anesthesia review: No   Patient denies shortness of breath, fever, cough and chest pain over the phone call  Your procedure is scheduled on Thursday September 24, 2023  Report to [facility] Main Entrance "A" at  1000  A.M., then check in with the Admitting office.  Call this number if you have problems the morning of surgery:  [phone]   If you have any questions prior to your surgery date call [phone]: Open Monday-Friday 8am-4pm If you experience any cold or flu symptoms such as cough, fever, chills, shortness of breath, etc. between now and your scheduled surgery, please notify us at the above number    Remember:  Do not eat after midnight the night before your surgery  You may drink clear liquids until  0930   the morning of your surgery.   Clear liquids allowed are: Water, Non-Citrus Juices (without pulp), Carbonated Beverages, Clear Tea, Black Coffee ONLY (NO MILK, CREAM OR POWDERED CREAMER of any kind), and Gatorade   Take these medicines the morning of surgery with A SIP OF WATER:  Zyrtec, Gemtesa  As needed: Tylenol  As of today, STOP taking any Aspirin (unless otherwise instructed by your surgeon) Aleve, Naproxen, Ibuprofen, Motrin, Advil, Goody's, BC's, all herbal medications, fish oil, and all vitamins.

## 2023-09-24 ENCOUNTER — Ambulatory Visit (HOSPITAL_BASED_OUTPATIENT_CLINIC_OR_DEPARTMENT_OTHER): Payer: HMO | Admitting: Anesthesiology

## 2023-09-24 ENCOUNTER — Encounter (HOSPITAL_COMMUNITY): Payer: Self-pay | Admitting: Orthopaedic Surgery

## 2023-09-24 ENCOUNTER — Other Ambulatory Visit: Payer: Self-pay

## 2023-09-24 ENCOUNTER — Encounter (HOSPITAL_COMMUNITY)
Admission: RE | Disposition: A | Discharge status: Home / Self Care | Payer: Self-pay | Source: Home / Self Care | Attending: Orthopaedic Surgery

## 2023-09-24 ENCOUNTER — Ambulatory Visit (HOSPITAL_COMMUNITY)
Admission: RE | Admit: 2023-09-24 | Discharge: 2023-09-24 | Disposition: A | Discharge status: Home / Self Care | Payer: HMO | Attending: Orthopaedic Surgery | Admitting: Orthopaedic Surgery

## 2023-09-24 ENCOUNTER — Ambulatory Visit (HOSPITAL_COMMUNITY): Payer: Self-pay | Admitting: Anesthesiology

## 2023-09-24 DIAGNOSIS — K219 Gastro-esophageal reflux disease without esophagitis: Secondary | ICD-10-CM | POA: Diagnosis not present

## 2023-09-24 DIAGNOSIS — M1990 Unspecified osteoarthritis, unspecified site: Secondary | ICD-10-CM | POA: Diagnosis not present

## 2023-09-24 DIAGNOSIS — X58XXXA Exposure to other specified factors, initial encounter: Secondary | ICD-10-CM | POA: Diagnosis not present

## 2023-09-24 DIAGNOSIS — Z7982 Long term (current) use of aspirin: Secondary | ICD-10-CM | POA: Diagnosis not present

## 2023-09-24 DIAGNOSIS — M1611 Unilateral primary osteoarthritis, right hip: Secondary | ICD-10-CM | POA: Insufficient documentation

## 2023-09-24 DIAGNOSIS — S76011A Strain of muscle, fascia and tendon of right hip, initial encounter: Secondary | ICD-10-CM | POA: Insufficient documentation

## 2023-09-24 DIAGNOSIS — Z79899 Other long term (current) drug therapy: Secondary | ICD-10-CM | POA: Diagnosis not present

## 2023-09-24 DIAGNOSIS — M7631 Iliotibial band syndrome, right leg: Secondary | ICD-10-CM

## 2023-09-24 DIAGNOSIS — M25551 Pain in right hip: Secondary | ICD-10-CM

## 2023-09-24 HISTORY — DX: Pure hypercholesterolemia, unspecified: E78.00

## 2023-09-24 HISTORY — PX: GLUTEUS MINIMUS REPAIR: SHX5843

## 2023-09-24 HISTORY — DX: Other specified postprocedural states: Z98.890

## 2023-09-24 HISTORY — DX: Nausea with vomiting, unspecified: R11.2

## 2023-09-24 HISTORY — DX: Other specified postprocedural states: R11.2

## 2023-09-24 HISTORY — DX: Other complications of anesthesia, initial encounter: T88.59XA

## 2023-09-24 HISTORY — DX: Gastro-esophageal reflux disease without esophagitis: K21.9

## 2023-09-24 LAB — CBC
HCT: 43.6 % (ref 36.0–46.0)
Hemoglobin: 14.9 g/dL (ref 12.0–15.0)
MCH: 32.1 pg (ref 26.0–34.0)
MCHC: 34.2 g/dL (ref 30.0–36.0)
MCV: 94 fL (ref 80.0–100.0)
Platelets: 270 10*3/uL (ref 150–400)
RBC: 4.64 MIL/uL (ref 3.87–5.11)
RDW: 13.1 % (ref 11.5–15.5)
WBC: 6.8 10*3/uL (ref 4.0–10.5)
nRBC: 0 % (ref 0.0–0.2)

## 2023-09-24 SURGERY — REPAIR, TENDON, GLUTEUS MINIMUS
Anesthesia: General | Laterality: Right

## 2023-09-24 MED ORDER — DEXAMETHASONE SODIUM PHOSPHATE 10 MG/ML IJ SOLN
INTRAMUSCULAR | Status: AC
  Filled 2023-09-24: qty 1

## 2023-09-24 MED ORDER — TRANEXAMIC ACID-NACL 1000-0.7 MG/100ML-% IV SOLN
1000.0000 mg | INTRAVENOUS | Status: AC
  Administered 2023-09-24: 1000 mg via INTRAVENOUS
  Filled 2023-09-24: qty 100

## 2023-09-24 MED ORDER — FENTANYL CITRATE (PF) 250 MCG/5ML IJ SOLN
INTRAMUSCULAR | Status: DC | PRN
  Administered 2023-09-24 (×2): 50 ug via INTRAVENOUS

## 2023-09-24 MED ORDER — SUGAMMADEX SODIUM 200 MG/2ML IV SOLN
INTRAVENOUS | Status: DC | PRN
  Administered 2023-09-24: 200 mg via INTRAVENOUS

## 2023-09-24 MED ORDER — ORAL CARE MOUTH RINSE
15.0000 mL | Freq: Once | OROMUCOSAL | Status: AC
Start: 2023-09-24 — End: 2023-09-24

## 2023-09-24 MED ORDER — GABAPENTIN 300 MG PO CAPS
300.0000 mg | ORAL_CAPSULE | Freq: Once | ORAL | Status: DC
  Filled 2023-09-24: qty 1

## 2023-09-24 MED ORDER — PHENYLEPHRINE 80 MCG/ML (10ML) SYRINGE FOR IV PUSH (FOR BLOOD PRESSURE SUPPORT)
PREFILLED_SYRINGE | INTRAVENOUS | Status: AC
Start: 2023-09-24 — End: ?
  Filled 2023-09-24: qty 10

## 2023-09-24 MED ORDER — BUPIVACAINE HCL (PF) 0.25 % IJ SOLN
INTRAMUSCULAR | Status: AC
  Filled 2023-09-24: qty 20

## 2023-09-24 MED ORDER — PROPOFOL 10 MG/ML IV BOLUS
INTRAVENOUS | Status: AC
  Filled 2023-09-24: qty 20

## 2023-09-24 MED ORDER — ROCURONIUM BROMIDE 10 MG/ML (PF) SYRINGE
PREFILLED_SYRINGE | INTRAVENOUS | Status: DC | PRN
  Administered 2023-09-24: 50 mg via INTRAVENOUS

## 2023-09-24 MED ORDER — CHLORHEXIDINE GLUCONATE 0.12 % MT SOLN
OROMUCOSAL | Status: AC
  Administered 2023-09-24: 15 mL via OROMUCOSAL
  Filled 2023-09-24: qty 15

## 2023-09-24 MED ORDER — OXYCODONE HCL 5 MG PO TABS
5.0000 mg | ORAL_TABLET | Freq: Once | ORAL | Status: AC | PRN
  Administered 2023-09-24: 5 mg via ORAL

## 2023-09-24 MED ORDER — PHENYLEPHRINE 80 MCG/ML (10ML) SYRINGE FOR IV PUSH (FOR BLOOD PRESSURE SUPPORT)
PREFILLED_SYRINGE | INTRAVENOUS | Status: DC | PRN
  Administered 2023-09-24 (×2): 160 ug via INTRAVENOUS

## 2023-09-24 MED ORDER — ONDANSETRON HCL 4 MG/2ML IJ SOLN
INTRAMUSCULAR | Status: AC
  Filled 2023-09-24: qty 2

## 2023-09-24 MED ORDER — CEFAZOLIN SODIUM-DEXTROSE 2-4 GM/100ML-% IV SOLN
2.0000 g | INTRAVENOUS | Status: AC
  Administered 2023-09-24: 2 g via INTRAVENOUS
  Filled 2023-09-24: qty 100

## 2023-09-24 MED ORDER — ROCURONIUM BROMIDE 10 MG/ML (PF) SYRINGE
PREFILLED_SYRINGE | INTRAVENOUS | Status: AC
  Filled 2023-09-24: qty 10

## 2023-09-24 MED ORDER — ACETAMINOPHEN 500 MG PO TABS: 1000.0000 mg | ORAL_TABLET | Freq: Once | ORAL | Status: DC

## 2023-09-24 MED ORDER — CHLORHEXIDINE GLUCONATE 0.12 % MT SOLN: 15.0000 mL | Freq: Once | OROMUCOSAL | Status: AC

## 2023-09-24 MED ORDER — FENTANYL CITRATE (PF) 250 MCG/5ML IJ SOLN
INTRAMUSCULAR | Status: AC
  Filled 2023-09-24: qty 5

## 2023-09-24 MED ORDER — LACTATED RINGERS IV SOLN
INTRAVENOUS | Status: DC
Start: 2023-09-24 — End: 2023-09-24

## 2023-09-24 MED ORDER — OXYCODONE HCL 5 MG PO TABS
ORAL_TABLET | ORAL | Status: AC
  Filled 2023-09-24: qty 1

## 2023-09-24 MED ORDER — LIDOCAINE 2% (20 MG/ML) 5 ML SYRINGE
INTRAMUSCULAR | Status: DC | PRN
  Administered 2023-09-24: 60 mg via INTRAVENOUS

## 2023-09-24 MED ORDER — ACETAMINOPHEN 160 MG/5ML PO SOLN
325.0000 mg | ORAL | Status: DC | PRN
Start: 2023-09-24 — End: 2023-09-24

## 2023-09-24 MED ORDER — DEXAMETHASONE SODIUM PHOSPHATE 10 MG/ML IJ SOLN
INTRAMUSCULAR | Status: DC | PRN
  Administered 2023-09-24: 5 mg via INTRAVENOUS

## 2023-09-24 MED ORDER — ONDANSETRON HCL 4 MG/2ML IJ SOLN: 4.0000 mg | Freq: Once | INTRAMUSCULAR | Status: DC | PRN

## 2023-09-24 MED ORDER — BUPIVACAINE HCL 0.25 % IJ SOLN
INTRAMUSCULAR | Status: DC | PRN
  Administered 2023-09-24: 10 mL

## 2023-09-24 MED ORDER — ACETAMINOPHEN 325 MG PO TABS
325.0000 mg | ORAL_TABLET | ORAL | Status: DC | PRN
Start: 2023-09-24 — End: 2023-09-24

## 2023-09-24 MED ORDER — FENTANYL CITRATE (PF) 100 MCG/2ML IJ SOLN
INTRAMUSCULAR | Status: AC
  Filled 2023-09-24: qty 2

## 2023-09-24 MED ORDER — EPHEDRINE SULFATE-NACL 50-0.9 MG/10ML-% IV SOSY: PREFILLED_SYRINGE | INTRAVENOUS | Status: DC | PRN

## 2023-09-24 MED ORDER — PROPOFOL 10 MG/ML IV BOLUS
INTRAVENOUS | Status: DC | PRN
  Administered 2023-09-24: 120 mg via INTRAVENOUS

## 2023-09-24 MED ORDER — OXYCODONE HCL 5 MG/5ML PO SOLN: 5.0000 mg | Freq: Once | ORAL | Status: AC | PRN

## 2023-09-24 MED ORDER — MEPERIDINE HCL 25 MG/ML IJ SOLN: 6.2500 mg | INTRAMUSCULAR | Status: DC | PRN

## 2023-09-24 MED ORDER — 0.9 % SODIUM CHLORIDE (POUR BTL) OPTIME
TOPICAL | Status: DC | PRN
  Administered 2023-09-24: 1000 mL

## 2023-09-24 MED ORDER — FENTANYL CITRATE (PF) 100 MCG/2ML IJ SOLN
25.0000 ug | INTRAMUSCULAR | Status: DC | PRN
  Administered 2023-09-24: 50 ug via INTRAVENOUS

## 2023-09-24 MED ORDER — LIDOCAINE 2% (20 MG/ML) 5 ML SYRINGE
INTRAMUSCULAR | Status: AC
Start: 2023-09-24 — End: ?
  Filled 2023-09-24: qty 5

## 2023-09-24 MED ORDER — ACETAMINOPHEN 500 MG PO TABS
1000.0000 mg | ORAL_TABLET | Freq: Once | ORAL | Status: AC
  Administered 2023-09-24: 1000 mg via ORAL
  Filled 2023-09-24: qty 2

## 2023-09-24 MED ORDER — ONDANSETRON HCL 4 MG/2ML IJ SOLN
INTRAMUSCULAR | Status: DC | PRN
  Administered 2023-09-24: 4 mg via INTRAVENOUS

## 2023-09-24 SURGICAL SUPPLY — 41 items
ANCHOR JUGGERKNOT SOFT 1.5 (Anchor) ×2 IMPLANT
ANCHOR JUGGERKNOT SOFT 2.9 (Anchor) IMPLANT
ANCHOR SUT QUATTRO KNTLS 4.5 (Anchor) IMPLANT
BAG COUNTER SPONGE SURGICOUNT (BAG) IMPLANT
BAG ZIPLOCK 12X15 (MISCELLANEOUS) ×1 IMPLANT
COVER SURGICAL LIGHT HANDLE (MISCELLANEOUS) ×1 IMPLANT
DRAPE SURG 17X11 SM STRL (DRAPES) ×1 IMPLANT
DRAPE SURG ORHT 6 SPLT 77X108 (DRAPES) ×1 IMPLANT
DRAPE U-SHAPE 47X51 STRL (DRAPES) ×1 IMPLANT
DRESSING MEPILEX FLEX 4X4 (GAUZE/BANDAGES/DRESSINGS) ×1 IMPLANT
DRSG AQUACEL AG ADV 3.5X10 (GAUZE/BANDAGES/DRESSINGS) IMPLANT
DRSG EMULSION OIL 3X16 NADH (GAUZE/BANDAGES/DRESSINGS) ×1 IMPLANT
DRSG MEPILEX FLEX 4X4 (GAUZE/BANDAGES/DRESSINGS) ×1
DRSG MEPILEX POST OP 4X8 (GAUZE/BANDAGES/DRESSINGS) ×1 IMPLANT
DURAPREP 26ML APPLICATOR (WOUND CARE) ×1 IMPLANT
ELECT REM PT RETURN 15FT ADLT (MISCELLANEOUS) ×1 IMPLANT
GAUZE PAD ABD 8X10 STRL (GAUZE/BANDAGES/DRESSINGS) ×1 IMPLANT
GAUZE SPONGE 4X4 12PLY STRL (GAUZE/BANDAGES/DRESSINGS) ×1 IMPLANT
GAUZE XEROFORM 1X8 LF (GAUZE/BANDAGES/DRESSINGS) IMPLANT
GLOVE BIOGEL PI IND STRL 6.5 (GLOVE) ×1 IMPLANT
GLOVE BIOGEL PI IND STRL 8 (GLOVE) ×1 IMPLANT
GLOVE ECLIPSE 6.0 STRL STRAW (GLOVE) ×1 IMPLANT
GLOVE INDICATOR 8.0 STRL GRN (GLOVE) IMPLANT
GLOVE SURG ORTHO 8.0 STRL STRW (GLOVE) ×1 IMPLANT
GOWN STRL REUS W/ TWL XL LVL3 (GOWN DISPOSABLE) ×2 IMPLANT
KIT BASIN OR (CUSTOM PROCEDURE TRAY) ×1 IMPLANT
KIT TURNOVER KIT A (KITS) IMPLANT
MANIFOLD NEPTUNE II (INSTRUMENTS) ×1 IMPLANT
NS IRRIG 1000ML POUR BTL (IV SOLUTION) ×1 IMPLANT
PACK TOTAL JOINT (CUSTOM PROCEDURE TRAY) ×1 IMPLANT
PROTECTOR NERVE ULNAR (MISCELLANEOUS) ×1 IMPLANT
STAPLER VISISTAT (STAPLE) IMPLANT
SUT ETHIBOND NAB CT1 #1 30IN (SUTURE) IMPLANT
SUT FIBERWIRE #2 38 T-5 BLUE (SUTURE)
SUT MNCRL AB 3-0 PS2 27 (SUTURE) IMPLANT
SUT MNCRL AB 4-0 PS2 18 (SUTURE) ×1 IMPLANT
SUT VIC AB 0 CT1 27XBRD ANBCTR (SUTURE) IMPLANT
SUT VIC AB 1 CT1 27XBRD ANTBC (SUTURE) ×3 IMPLANT
SUT VIC AB 2-0 CT1 TAPERPNT 27 (SUTURE) ×1 IMPLANT
SUTURE FIBERWR #2 38 T-5 BLUE (SUTURE) IMPLANT
WATER STERILE IRR 1000ML POUR (IV SOLUTION) ×1 IMPLANT

## 2023-09-24 NOTE — Anesthesia Postprocedure Evaluation (Addendum)
Anesthesia Post Note  Patient: Shelby Chan  Procedure(s) Performed: RIGHT GLUTEUS MEDIUS REPAIR WITH COLLAGEN PATCH AUGMENTATION (Right)     Patient location during evaluation: PACU Anesthesia Type: General Level of consciousness: awake and alert Pain management: pain level controlled Vital Signs Assessment: post-procedure vital signs reviewed and stable Respiratory status: spontaneous breathing, nonlabored ventilation, respiratory function stable and patient connected to nasal cannula oxygen Cardiovascular status: blood pressure returned to baseline and stable Postop Assessment: no apparent nausea or vomiting Anesthetic complications: no  No notable events documented.  Last Vitals:  Vitals:   09/24/23 1530 09/24/23 1545  BP: 137/80 (!) 142/61  Pulse: 61 76  Resp: 13 12  Temp:  (!) 36.3 C  SpO2: 97% 92%                 Shelton Silvas

## 2023-09-24 NOTE — H&P (Signed)
Post Operative Evaluation      Procedure/Date of Surgery: Right hip percutaneous pinning none November 07, 2021   Interval History:    Presents today for follow-up of the right hip.  She does state that she did get some initial relief from her injection.  This is subsequently worn off.  At this point she is having a very difficult time playing with her grandchildren are being active.  She is now rest back to using a walker     PMH/PSH/Family History/Social History/Meds/Allergies:         Past Medical History:  Diagnosis Date   Arthritis               Past Surgical History:  Procedure Laterality Date   CHOLECYSTECTOMY       HIP PINNING,CANNULATED Right 11/07/2021    Procedure: RIGHT HIP PERCUTANEOUS SCREW FIXATION;  Surgeon: [name], MD;  Location: [facility] OR;  Service: Orthopedics;  Laterality: Right;   Right leg fracture s/p repair with 2 pins placed            Social History         Socioeconomic History   Marital status: Married      Spouse name: Not on file   Number of children: Not on file   Years of education: Not on file   Highest education level: Not on file  Occupational History   Not on file  Tobacco Use   Smoking status: Never   Smokeless tobacco: Never  Vaping Use   Vaping status: Never Used  Substance and Sexual Activity   Alcohol use: Never   Drug use: Never   Sexual activity: Not on file  Other Topics Concern   Not on file  Social History Narrative   Not on file    Social Drivers of Health    Financial Resource Strain: Not on file  Food Insecurity: Not on file  Transportation Needs: Not on file  Physical Activity: Not on file  Stress: Not on file  Social Connections: Not on file         Family History  Problem Relation Age of Onset   Heart disease Mother          Allergies  No Known Allergies         Current Outpatient Medications  Medication Sig Dispense Refill   acetaminophen (TYLENOL) 500 MG tablet Take  1,000 mg by mouth every 6 (six) hours as needed for moderate pain.       aspirin EC 81 MG tablet Take 1 tablet (81 mg total) by mouth 2 (two) times daily. Continue taking once a day after a month 60 tablet 0   Coenzyme Q10 50 MG CAPS Take 100 mg by mouth daily.       loratadine (CLARITIN) 10 MG tablet Take 10 mg by mouth daily.       Multiple Vitamin (MULTIVITAMIN WITH MINERALS) TABS tablet Take 1 tablet by mouth daily.          No current facility-administered medications for this visit.      Imaging Results (Last 48 hours)  No results found.     Review of Systems:   A ROS was performed including pertinent positives and negatives as documented in the HPI.     Musculoskeletal Exam:     There were no vitals taken for this visit.   Right hip lateral incision is well-healed.  No erythema or drainage.  She is able  to extend at the right knee while sitting.  She does have a Trendelenburg gait.  She has tenderness to palpation of the greater trochanter.  Sensation intact all distributions.  2+ dorsalis pedis pulse   Imaging:     X-ray AP pelvis, right hip 2 views: Status post percutaneous pinning in good alignment without evidence of complication with a healed hip   MRI right hip: Healed femoral neck fracture with evidence of gluteus medius minimus tearing   I personally reviewed and interpreted the radiographs.     Assessment:   89 year old female presents today status post right hip percutaneous pinning today with a healed hip but evidence of a gluteus medius tendon tear in the setting of a Trendelenburg gait and persistent lateral trochanteric based pain.  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  At this time she has trialed physical therapy as well as an injection of the right hip.  Injection did give her transient relief although this is subsequently worn off.  At this time she has regressed and is now back to using a  walker and has not been able to be active with her grandchildren.  Given this I did discuss the possibility of an IT band release with the gluteus medius repair for symptomatic hardware laterally.  After discussion with her and her daughter she is ultimately elected for this   Plan :     -Plan for right gluteus medius repair with IT band lengthening     After a lengthy discussion of treatment options, including risks, benefits, alternatives, complications of surgical and nonsurgical conservative options, the patient elected surgical repair.    The patient  is aware of the material risks  and complications including, but not limited to injury to adjacent structures, neurovascular injury, infection, numbness, bleeding, implant failure, thermal burns, stiffness, persistent pain, failure to heal, disease transmission from allograft, need for further surgery, dislocation, anesthetic risks, blood clots, risks of death,and others. The probabilities of surgical success and failure discussed with patient given their particular co-morbidities.The time and nature of expected rehabilitation and recovery was discussed.The patient's questions were all answered preoperatively.  No barriers to understanding were noted. I explained the natural history of the disease process and Rx rationale.  I explained to the patient what I considered to be reasonable expectations given their personal situation.  The final treatment plan was arrived at through a shared patient decision making process model.         I personally saw and evaluated the patient, and participated in the management and treatment plan.   [name], MD Attending Physician, Orthopedic Surgery

## 2023-09-24 NOTE — Discharge Instructions (Signed)
Discharge Instructions    Attending Surgeon: [name], MD Office Phone Number: [phone]   Diagnosis and Procedures:    Surgeries Performed: Right hip gluteus medius repair with gluteus maximus tendon transfer  Discharge Plan:    Diet: Resume usual diet. Begin with light or bland foods.  Drink plenty of fluids.  Activity:  Weight bearing as tolerated. You are advised to go home directly from the hospital or surgical center. Restrict your activities.  GENERAL INSTRUCTIONS: 1.  Please apply ice to your wound to help with swelling and inflammation. This will improve your comfort and your overall recovery following surgery.     2. Please call Dr. Serena Croissant office at [phone] with questions Monday-Friday during business hours. If no one answers, please leave a message and someone should get back to the patient within 24 hours. For emergencies please call 911 or proceed to the emergency room.   3. Patient to notify surgical team if experiences any of the following: Bowel/Bladder dysfunction, uncontrolled pain, nerve/muscle weakness, incision with increased drainage or redness, nausea/vomiting and Fever greater than 101.0 F.  Be alert for signs of infection including redness, streaking, odor, fever or chills. Be alert for excessive pain or bleeding and notify your surgeon immediately.  WOUND INSTRUCTIONS:   Leave your dressing, cast, or splint in place until your post operative visit.  Keep it clean and dry.  Always keep the incision clean and dry until the staples/sutures are removed. If there is no drainage from the incision you should keep it open to air. If there is drainage from the incision you must keep it covered at all times until the drainage stops  Do not soak in a bath tub, hot tub, pool, lake or other body of water until 21 days after your surgery and your incision is completely dry and healed.  If you have removable sutures (or staples) they must be  removed 10-14 days (unless otherwise instructed) from the day of your surgery.     1)  Elevate the extremity as much as possible.  2)  Keep the dressing clean and dry.  3)  Please call us if the dressing becomes wet or dirty.  4)  If you are experiencing worsening pain or worsening swelling, please call.     MEDICATIONS: Resume all previous home medications at the previous prescribed dose and frequency unless otherwise noted Start taking the  pain medications on an as-needed basis as prescribed  Please taper down pain medication over the next week following surgery.  Ideally you should not require a refill of any narcotic pain medication.  Take pain medication with food to minimize nausea. In addition to the prescribed pain medication, you may take over-the-counter pain relievers such as Tylenol.  Do NOT take additional tylenol if your pain medication already has tylenol in it.  Aspirin 325mg  daily per instructions on bottle. Narcotic policy: Per [facility] clinic policy, our goal is ensure optimal postoperative pain control with a multimodal pain management strategy. For all [facility] patients, our goal is to wean post-operative narcotic medications by 6 weeks post-operatively, and many times sooner. If this is not possible due to utilization of pain medication prior to surgery, your [facility] doctor will support your acute post-operative pain control for the first 6 weeks postoperatively, with a plan to transition you back to your primary pain team following that. Cyndia Skeeters will work to ensure a Therapist, occupational.       FOLLOWUP INSTRUCTIONS: 1. Follow up  at the Physical Therapy Clinic 3-4 days following surgery. This appointment should be scheduled unless other arrangements have been made.The Physical Therapy scheduling number is [phone] if an appointment has not already been arranged.  2. Contact Dr. Serena Croissant office during office hours at [phone] or the practice after hours line  at [phone] for non-emergencies. For medical emergencies call 911.   Discharge Location: Home

## 2023-09-24 NOTE — Anesthesia Procedure Notes (Signed)
Procedure Name: Intubation Date/Time: 09/24/2023 1:22 PM  Performed by: April Holding, CRNAPre-anesthesia Checklist: Patient identified, Emergency Drugs available, Suction available and Patient being monitored Patient Re-evaluated:Patient Re-evaluated prior to induction Oxygen Delivery Method: Circle System Utilized Preoxygenation: Pre-oxygenation with 100% oxygen Induction Type: IV induction Ventilation: Mask ventilation without difficulty Laryngoscope Size: Miller and 2 Grade View: Grade II Tube type: Oral Tube size: 7.0 mm Number of attempts: 1 Airway Equipment and Method: Stylet and Bite block Placement Confirmation: ETT inserted through vocal cords under direct vision, positive ETCO2 and breath sounds checked- equal and bilateral Secured at: 22 cm Tube secured with: Tape Dental Injury: Teeth and Oropharynx as per pre-operative assessment

## 2023-09-24 NOTE — Op Note (Signed)
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  The risk and benefits of the procedure were discussed in detail and documented in the pre-operative evaluation.   PREOPERATIVE DIAGNOSIS: 1.  Right hip gluteus medius tendinopathy  POSTOPERATIVE DIAGNOSIS: Same.  PROCEDURE: 1.  Right hip gluteus maximus tendon transfer with IT band resection  SURGEON: Benancio Deeds MD  ASSISTANT: Ardeen Fillers, ATC  ANESTHESIA:  general  IV FLUIDS AND URINE: See anesthesia record.  ANTIBIOTICS: Ancef  ESTIMATED BLOOD LOSS: 10 mL.  IMPLANTS:  Implant Name Type Inv. Item Serial No. Manufacturer Lot No. LRB No. Used Action  ANCHOR SUT QUATTRO KNTLS 4.5 - [id number] Anchor ANCHOR SUT QUATTRO KNTLS 4.5  [employer](ORTH,TRAU,BIO,SG) [id number] Right 1 Implanted  ANCHOR SUT QUATTRO KNTLS 4.5 - [id number] Anchor ANCHOR SUT QUATTRO KNTLS 4.5  [employer](ORTH,TRAU,BIO,SG) [id number] Right 1 Implanted  ANCHOR JUGGERKNOT SOFT 1.5 - [id number] Anchor ANCHOR JUGGERKNOT SOFT 1.5  [employer](ORTH,TRAU,BIO,SG) [id number] Right 1 Implanted  ANCHOR JUGGERKNOT SOFT 1.5 - [id number] Anchor ANCHOR JUGGERKNOT SOFT 1.5  [employer](ORTH,TRAU,BIO,SG) [id number] Right 1 Implanted    DRAINS: None  CULTURES: None  COMPLICATIONS: none  DESCRIPTION OF PROCEDURE:   The patient was identified in the preoperative holding area.  Antibiotics were given 1 hour prior to skin incision.  Timeout was performed and surgical site was marked with nursing.  The patient was subsequently taken back to the operating room.  Anesthesia was induced. The patient was placed in the lateral decubitus position.  Axillary roll and down peroneal nerve was padded well   At this time I began with an approach to the lateral trochanter centered at the vastus ridge.  15 blade was used to incise skin.  This was done down to the level of the IT  band.  Electrocautery was utilized to achieve hemostasis.  The gluteus medius tendon was found to be torn at the musculotendinous junction near the side of the percutaneous screw placement.  As result the decision was made to utilize a flap of the gluteus maximus.  At this time the IT band was marked in such a way as to preserve an anterior flap.  This was incised completely with a of an abduction.  At this time a flap was mobilized anteriorly from the gluteus maximus in a triangular fashion.  This mobilized well over to the trochanter. The trochanteric bursectomy was seen and excises with Metzenbaums.   A double row construct was utilized with 2 medial row all suture anchors.  These were brought up through the remaining gluteus medius tendon and into the flap.  2 of these were tied to provisionally stabilize the flap.  These were then brought up to the distal most aspect of the flap and brought into 2 lateral row anchors over the native footprint of the gluteus medius.  The IT band incision was closed distally but left open proximally and recessed in order to not cause irritation over the screws.   This time the wound was thoroughly irrigated.  The posterior aspect of the IT band was sutured to the posterior limb of the flap.  This was done with 0 Vicryl.  This was done in an interrupted fashion.  The wound was again irrigated and closed in layers of 0 Vicryl, 2-0 Vicryl and 3-0 nylon.  Xeroform, gauze, Aquacel dressing were applied.   Instrument, sponge, and needle counts were correct prior to wound closure  and at the conclusion of the case.  The patient was taken to the PACU without complication         POSTOPERATIVE PLAN: The patient will be weightbearing tolerated.  She will be placed on Aspirin for blood clot prevention.  I will see the patient back in 2 weeks.     Benancio Deeds, MD 2:15 PM

## 2023-09-24 NOTE — Brief Op Note (Signed)
   Brief Op Note  Date of Surgery: 09/24/2023  Preoperative Diagnosis: RIGHT HIP GLUTEUS MEDIUS TEAR  Postoperative Diagnosis: same  Procedure: Procedure(s): RIGHT GLUTEUS MEDIUS REPAIR WITH COLLAGEN PATCH AUGMENTATION  Implants: Implant Name Type Inv. Item Serial No. Manufacturer Lot No. LRB No. Used Action  ANCHOR SUT QUATTRO KNTLS 4.5 - [id number] Anchor ANCHOR SUT QUATTRO KNTLS 4.5  [employer](ORTH,TRAU,BIO,SG) [id number] Right 1 Implanted  ANCHOR SUT QUATTRO KNTLS 4.5 - [id number] Anchor ANCHOR SUT QUATTRO KNTLS 4.5  [employer](ORTH,TRAU,BIO,SG) [id number] Right 1 Implanted  ANCHOR JUGGERKNOT SOFT 1.5 - [id number] Anchor ANCHOR JUGGERKNOT SOFT 1.5  [employer](ORTH,TRAU,BIO,SG) [id number] Right 1 Implanted  ANCHOR JUGGERKNOT SOFT 1.5 - [id number] Anchor ANCHOR JUGGERKNOT SOFT 1.5  [employer](ORTH,TRAU,BIO,SG) [id number] Right 1 Implanted    Surgeons: Surgeon(s): [name], MD  Anesthesia: General    Estimated Blood Loss: See anesthesia record  Complications: None  Condition to PACU: Stable  Benancio Deeds, MD 09/24/2023 2:15 PM

## 2023-09-24 NOTE — Transfer of Care (Signed)
Immediate Anesthesia Transfer of Care Note  Patient: Shelby Chan  Procedure(s) Performed: RIGHT GLUTEUS MEDIUS REPAIR WITH COLLAGEN PATCH AUGMENTATION (Right)  Patient Location: PACU  Anesthesia Type:General  Level of Consciousness: awake and alert   Airway & Oxygen Therapy: Patient Spontanous Breathing  Post-op Assessment: Report given to RN and Post -op Vital signs reviewed and stable  Post vital signs: Reviewed and stable  Last Vitals:  Vitals Value Taken Time  BP 132/89 09/24/23 1436  Temp 36.3 C 09/24/23 1438  Pulse 90 09/24/23 1438  Resp 19 09/24/23 1438  SpO2 93 % 09/24/23 1438  Vitals shown include unfiled device data.  Last Pain:  Vitals:   09/24/23 1016  TempSrc:   PainSc: 0-No pain         Complications: No notable events documented.

## 2023-09-25 ENCOUNTER — Encounter (HOSPITAL_COMMUNITY): Payer: Self-pay | Admitting: Orthopaedic Surgery

## 2023-09-28 DIAGNOSIS — M6281 Muscle weakness (generalized): Secondary | ICD-10-CM | POA: Diagnosis not present

## 2023-09-28 DIAGNOSIS — R269 Unspecified abnormalities of gait and mobility: Secondary | ICD-10-CM | POA: Diagnosis not present

## 2023-09-28 DIAGNOSIS — M67959 Unspecified disorder of synovium and tendon, unspecified thigh: Secondary | ICD-10-CM | POA: Diagnosis not present

## 2023-09-29 ENCOUNTER — Ambulatory Visit (HOSPITAL_BASED_OUTPATIENT_CLINIC_OR_DEPARTMENT_OTHER): Payer: HMO | Admitting: Physical Therapy

## 2023-09-30 DIAGNOSIS — M6281 Muscle weakness (generalized): Secondary | ICD-10-CM | POA: Diagnosis not present

## 2023-09-30 DIAGNOSIS — M67959 Unspecified disorder of synovium and tendon, unspecified thigh: Secondary | ICD-10-CM | POA: Diagnosis not present

## 2023-09-30 DIAGNOSIS — R269 Unspecified abnormalities of gait and mobility: Secondary | ICD-10-CM | POA: Diagnosis not present

## 2023-10-04 IMAGING — DX DG HIP (WITH OR WITHOUT PELVIS) 2-3V*R*
3 series · 3 of 3 positions shown · non-contrast
Comparison: November 27, 2021

CLINICAL DATA: Postoperative of right hip pinning.

EXAM:
DG HIP (WITH OR WITHOUT PELVIS) 3V RIGHT

[hip ap]
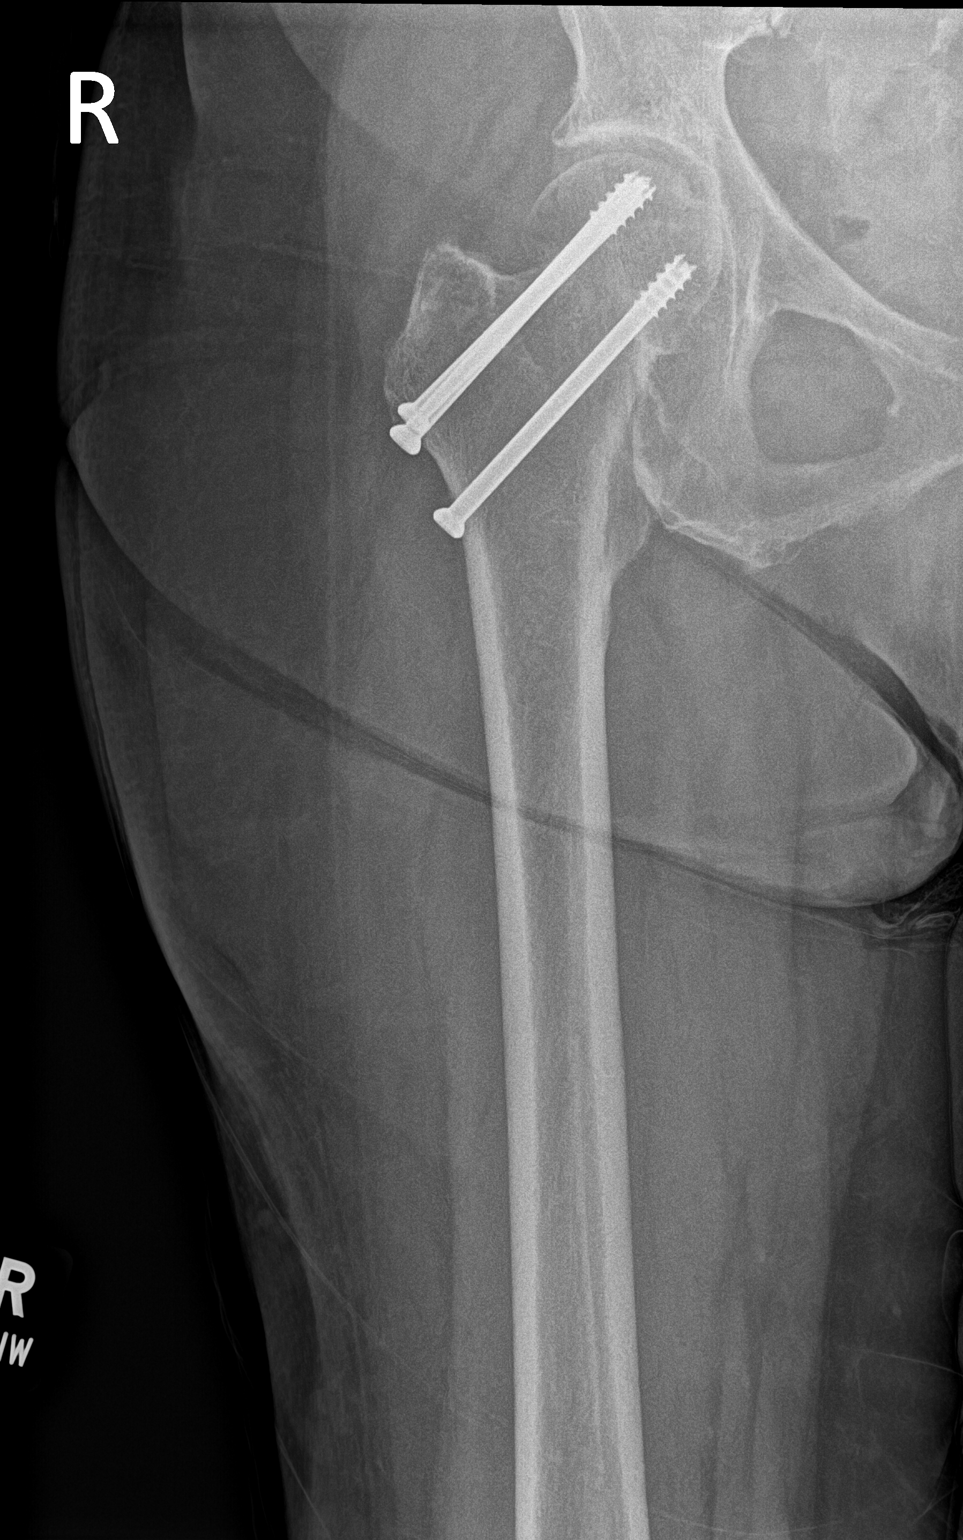

[hip lat]
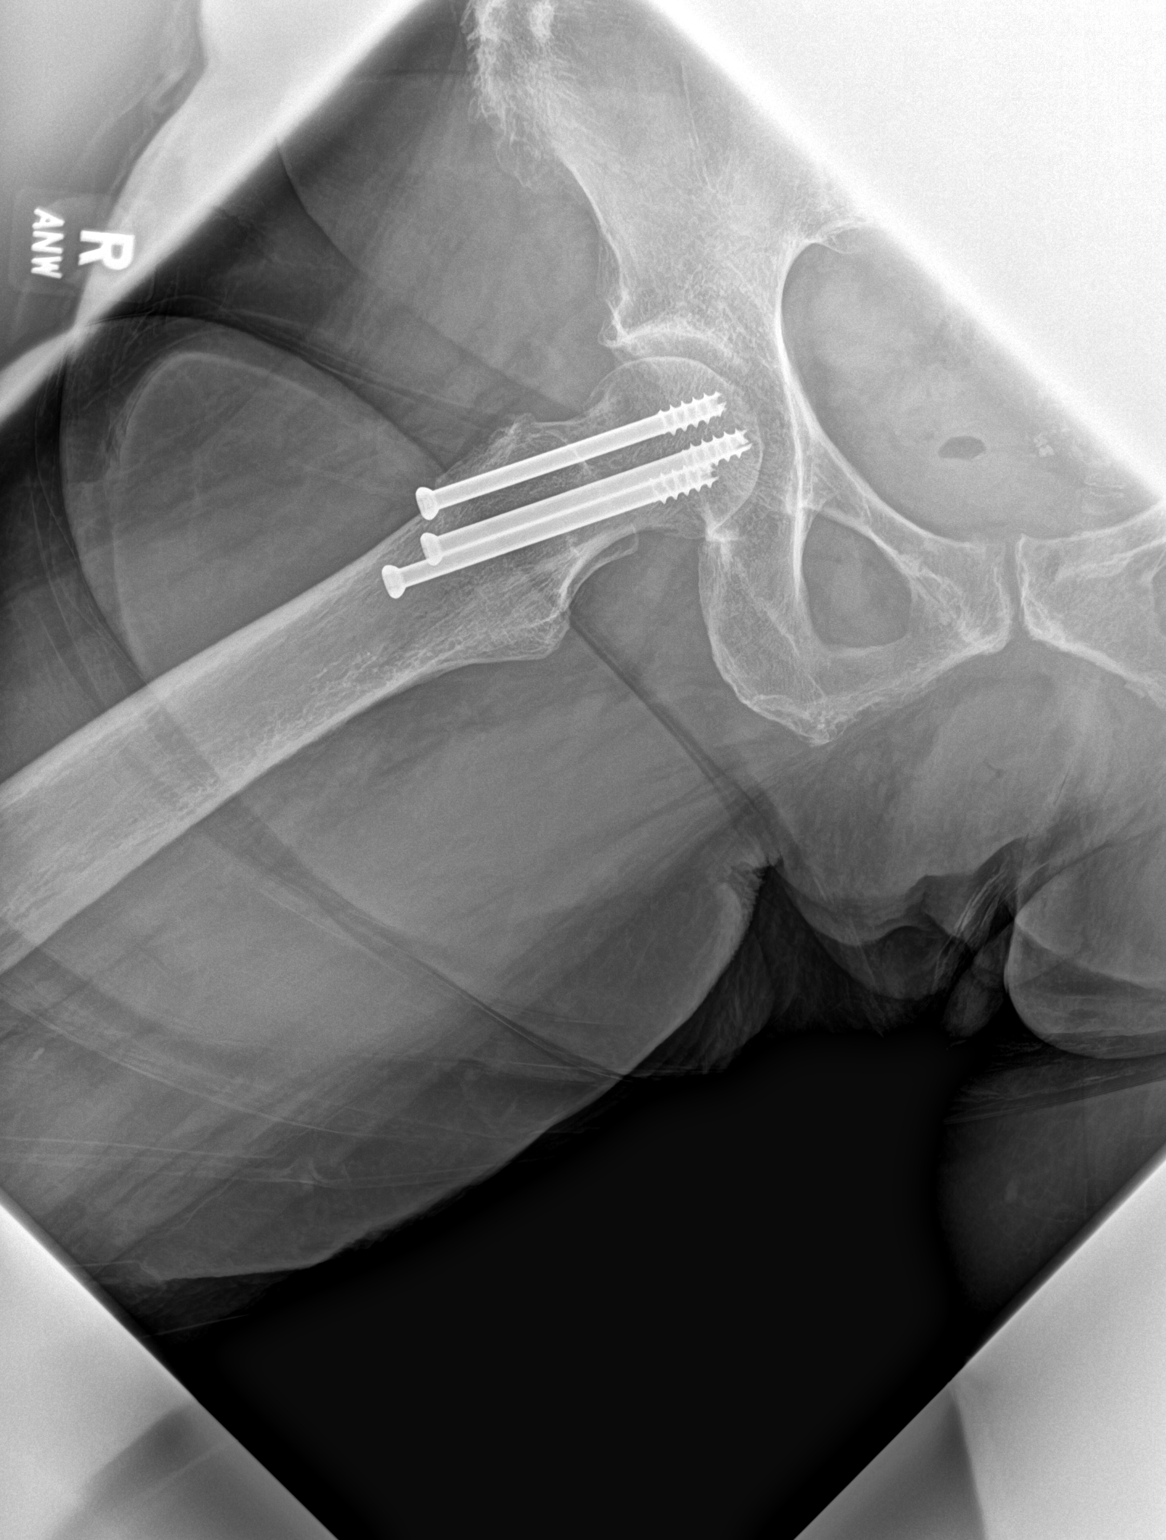

[pelvis ap]
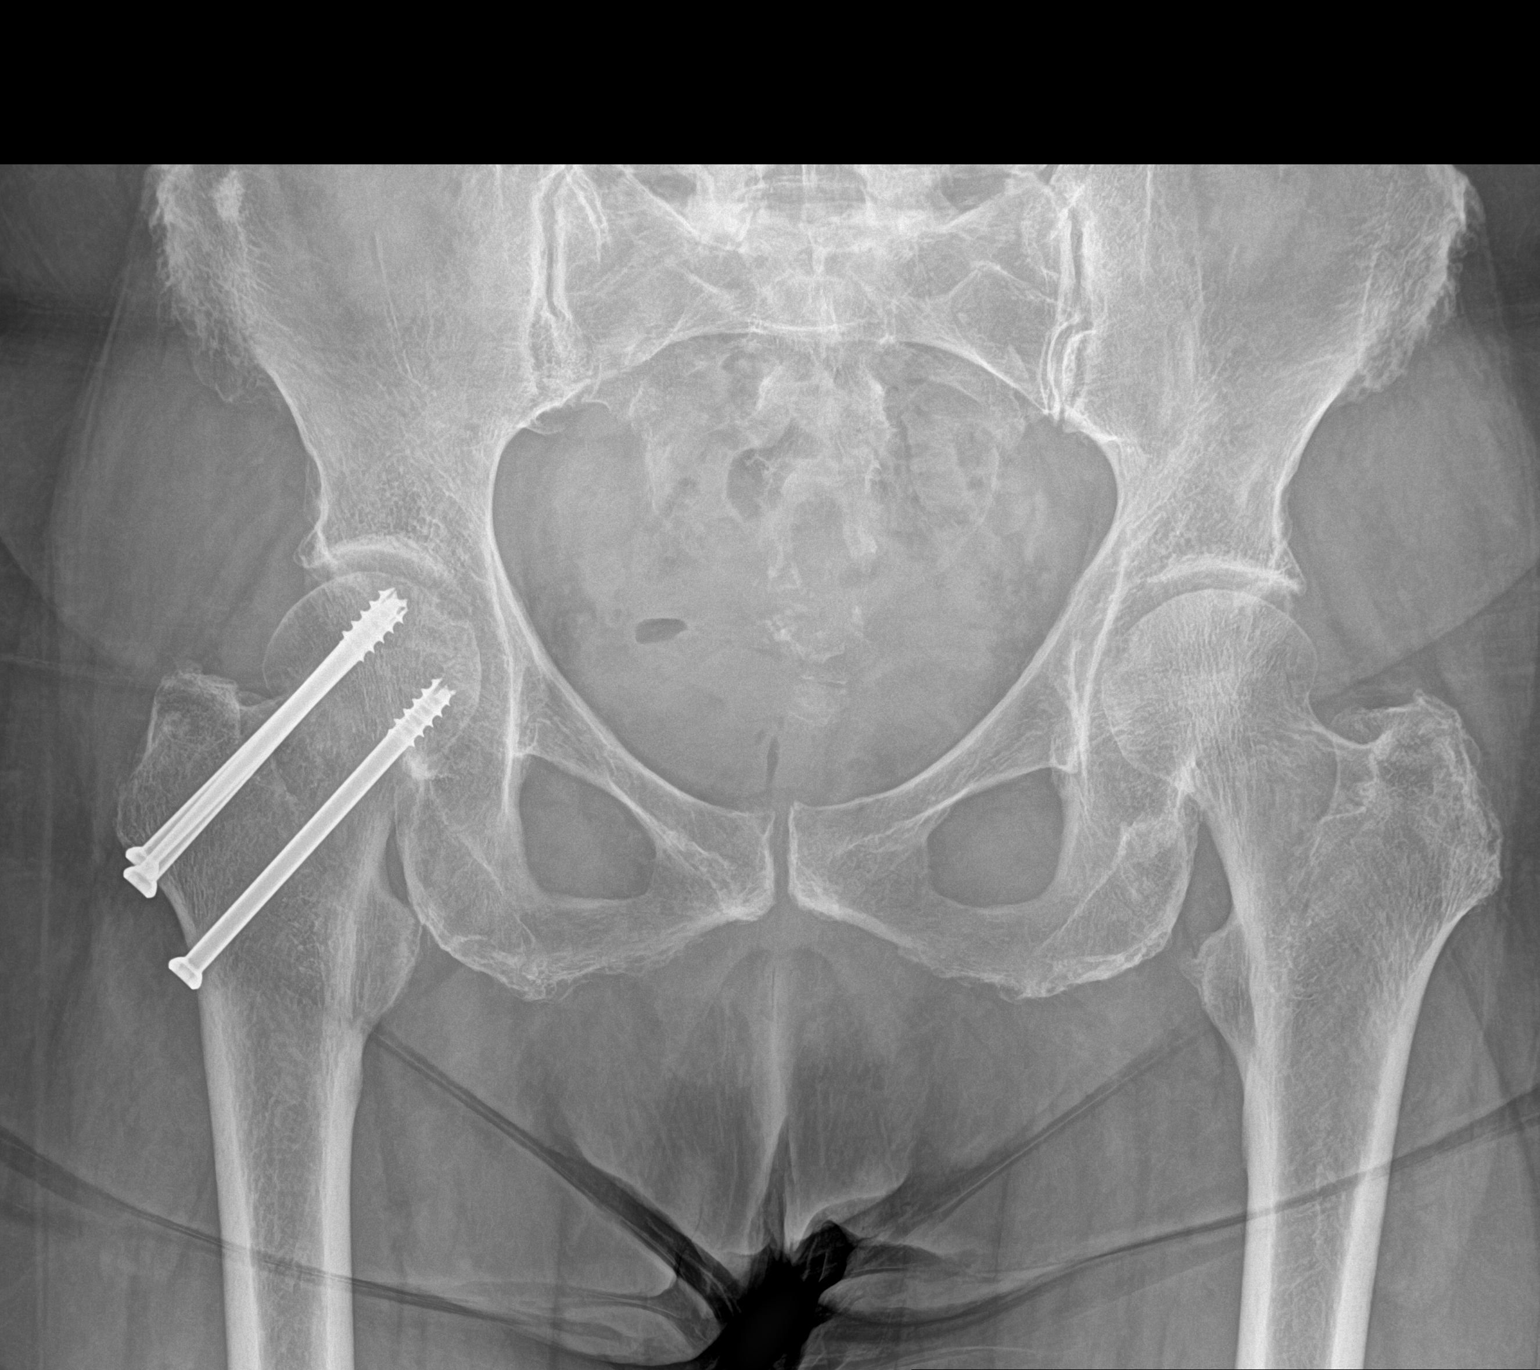

[3 of 3 positions shown; findings below may reference images not displayed]

FINDINGS: There is no evidence of hip fracture or dislocation. Evidence of
prior right proximal femur pinning unchanged compared to prior exam.
Mild degenerative joint changes of pubic symphysis stable.
IMPRESSION: Right hip pinning unchanged.

## 2023-10-05 DIAGNOSIS — R269 Unspecified abnormalities of gait and mobility: Secondary | ICD-10-CM | POA: Diagnosis not present

## 2023-10-05 DIAGNOSIS — M6281 Muscle weakness (generalized): Secondary | ICD-10-CM | POA: Diagnosis not present

## 2023-10-05 DIAGNOSIS — M67959 Unspecified disorder of synovium and tendon, unspecified thigh: Secondary | ICD-10-CM | POA: Diagnosis not present

## 2023-10-07 ENCOUNTER — Ambulatory Visit (HOSPITAL_BASED_OUTPATIENT_CLINIC_OR_DEPARTMENT_OTHER): Payer: HMO | Admitting: Orthopaedic Surgery

## 2023-10-07 DIAGNOSIS — M67959 Unspecified disorder of synovium and tendon, unspecified thigh: Secondary | ICD-10-CM

## 2023-10-07 NOTE — Progress Notes (Signed)
 Post Operative Evaluation    Procedure/Date of Surgery: Right hip gluteus maximus tendon transfer 1/23  Interval History:   Pleasant 2-weeks status post the above procedure.  Overall she is doing very well.  She states that the right hip feels dramatically better.  She is now walking with just a cane.  She has been participating in physical therapy   PMH/PSH/Family History/Social History/Meds/Allergies:    Past Medical History:  Diagnosis Date   Arthritis    Complication of anesthesia    GERD (gastroesophageal reflux disease)    High cholesterol    PONV (postoperative nausea and vomiting)    Past Surgical History:  Procedure Laterality Date   CHOLECYSTECTOMY     GLUTEUS MINIMUS REPAIR Right 09/24/2023   Procedure: RIGHT GLUTEUS MEDIUS REPAIR WITH COLLAGEN PATCH AUGMENTATION;  Surgeon: Genelle Standing, MD;  Location: [facility] OR;  Service: Orthopedics;  Laterality: Right;   HIP PINNING,CANNULATED Right 11/07/2021   Procedure: RIGHT HIP PERCUTANEOUS SCREW FIXATION;  Surgeon: Genelle Standing, MD;  Location: [facility] OR;  Service: Orthopedics;  Laterality: Right;   Right leg fracture s/p repair with 2 pins placed     Social History   Socioeconomic History   Marital status: Widowed    Spouse name: Not on file   Number of children: Not on file   Years of education: Not on file   Highest education level: Not on file  Occupational History   Not on file  Tobacco Use   Smoking status: Never   Smokeless tobacco: Never  Vaping Use   Vaping status: Never Used  Substance and Sexual Activity   Alcohol use: Never   Drug use: Never   Sexual activity: Not on file  Other Topics Concern   Not on file  Social History Narrative   Not on file   Social Drivers of Health   Financial Resource Strain: Not on file  Food Insecurity: Not on file  Transportation Needs: Not on file  Physical Activity: Not on file  Stress: Not on file  Social Connections: Not on file    Family History  Problem Relation Age of Onset   Heart disease Mother    No Known Allergies Current Outpatient Medications  Medication Sig Dispense Refill   acetaminophen  (TYLENOL ) 650 MG CR tablet Take 1,300 mg by mouth every 8 (eight) hours as needed for pain.     aspirin  EC 81 MG tablet Take 1 tablet (81 mg total) by mouth 2 (two) times daily. Continue taking once a day after a month 60 tablet 0   Calcium Carbonate-Vitamin D  (CALTRATE 600+D PO) Take 1 tablet by mouth daily.     cetirizine (ZYRTEC) 10 MG tablet Take 10 mg by mouth daily.     Coenzyme Q10 100 MG capsule Take 100 mg by mouth daily.     GEMTESA 75 MG TABS Take 1 tablet by mouth daily.     Multiple Vitamin (MULTIVITAMIN WITH MINERALS) TABS tablet Take 1 tablet by mouth daily.     No current facility-administered medications for this visit.   No results found.  Review of Systems:   A ROS was performed including pertinent positives and negatives as documented in the HPI.   Musculoskeletal Exam:    There were no vitals taken for this visit.  Right hip lateral incision is well-healed.  30 degrees internal/external rotation of the right hip.  Gait is improving.  Walks with just a cane in the right hand  Imaging:     I personally reviewed and interpreted the radiographs.   Assessment:   89 year old female presents today 2 weeks status post right gluteus maximus tendon transfer.  Overall she is doing extremely well.  At this time she will continue to advance her weightbearing.  I will plan to see her back in 4 weeks for reassessment  Plan :    -Return to clinic 4 weeks for reassessment    I personally saw and evaluated the patient, and participated in the management and treatment plan.  Elspeth Parker, MD Attending Physician, Orthopedic Surgery  This document was dictated using Dragon voice recognition software. A reasonable attempt at proof reading has been made to minimize errors.

## 2023-10-08 ENCOUNTER — Encounter (HOSPITAL_BASED_OUTPATIENT_CLINIC_OR_DEPARTMENT_OTHER): Payer: Medicare Other | Admitting: Orthopaedic Surgery

## 2023-10-08 DIAGNOSIS — M6281 Muscle weakness (generalized): Secondary | ICD-10-CM | POA: Diagnosis not present

## 2023-10-08 DIAGNOSIS — R269 Unspecified abnormalities of gait and mobility: Secondary | ICD-10-CM | POA: Diagnosis not present

## 2023-10-08 DIAGNOSIS — M67959 Unspecified disorder of synovium and tendon, unspecified thigh: Secondary | ICD-10-CM | POA: Diagnosis not present

## 2023-10-12 DIAGNOSIS — M6281 Muscle weakness (generalized): Secondary | ICD-10-CM | POA: Diagnosis not present

## 2023-10-12 DIAGNOSIS — R269 Unspecified abnormalities of gait and mobility: Secondary | ICD-10-CM | POA: Diagnosis not present

## 2023-10-12 DIAGNOSIS — M67959 Unspecified disorder of synovium and tendon, unspecified thigh: Secondary | ICD-10-CM | POA: Diagnosis not present

## 2023-10-14 DIAGNOSIS — R269 Unspecified abnormalities of gait and mobility: Secondary | ICD-10-CM | POA: Diagnosis not present

## 2023-10-14 DIAGNOSIS — M6281 Muscle weakness (generalized): Secondary | ICD-10-CM | POA: Diagnosis not present

## 2023-10-14 DIAGNOSIS — M67959 Unspecified disorder of synovium and tendon, unspecified thigh: Secondary | ICD-10-CM | POA: Diagnosis not present

## 2023-10-19 DIAGNOSIS — M6281 Muscle weakness (generalized): Secondary | ICD-10-CM | POA: Diagnosis not present

## 2023-10-19 DIAGNOSIS — M67959 Unspecified disorder of synovium and tendon, unspecified thigh: Secondary | ICD-10-CM | POA: Diagnosis not present

## 2023-10-19 DIAGNOSIS — R269 Unspecified abnormalities of gait and mobility: Secondary | ICD-10-CM | POA: Diagnosis not present

## 2023-10-26 DIAGNOSIS — M67959 Unspecified disorder of synovium and tendon, unspecified thigh: Secondary | ICD-10-CM | POA: Diagnosis not present

## 2023-10-26 DIAGNOSIS — R269 Unspecified abnormalities of gait and mobility: Secondary | ICD-10-CM | POA: Diagnosis not present

## 2023-10-26 DIAGNOSIS — M6281 Muscle weakness (generalized): Secondary | ICD-10-CM | POA: Diagnosis not present

## 2023-10-28 DIAGNOSIS — M6281 Muscle weakness (generalized): Secondary | ICD-10-CM | POA: Diagnosis not present

## 2023-10-28 DIAGNOSIS — R269 Unspecified abnormalities of gait and mobility: Secondary | ICD-10-CM | POA: Diagnosis not present

## 2023-10-28 DIAGNOSIS — M67959 Unspecified disorder of synovium and tendon, unspecified thigh: Secondary | ICD-10-CM | POA: Diagnosis not present

## 2023-11-02 DIAGNOSIS — M67959 Unspecified disorder of synovium and tendon, unspecified thigh: Secondary | ICD-10-CM | POA: Diagnosis not present

## 2023-11-02 DIAGNOSIS — M6281 Muscle weakness (generalized): Secondary | ICD-10-CM | POA: Diagnosis not present

## 2023-11-02 DIAGNOSIS — R269 Unspecified abnormalities of gait and mobility: Secondary | ICD-10-CM | POA: Diagnosis not present

## 2023-11-04 DIAGNOSIS — M6281 Muscle weakness (generalized): Secondary | ICD-10-CM | POA: Diagnosis not present

## 2023-11-04 DIAGNOSIS — R269 Unspecified abnormalities of gait and mobility: Secondary | ICD-10-CM | POA: Diagnosis not present

## 2023-11-04 DIAGNOSIS — M67959 Unspecified disorder of synovium and tendon, unspecified thigh: Secondary | ICD-10-CM | POA: Diagnosis not present

## 2023-11-06 ENCOUNTER — Encounter (HOSPITAL_BASED_OUTPATIENT_CLINIC_OR_DEPARTMENT_OTHER): Payer: HMO | Admitting: Orthopaedic Surgery

## 2023-11-09 DIAGNOSIS — R269 Unspecified abnormalities of gait and mobility: Secondary | ICD-10-CM | POA: Diagnosis not present

## 2023-11-09 DIAGNOSIS — M6281 Muscle weakness (generalized): Secondary | ICD-10-CM | POA: Diagnosis not present

## 2023-11-09 DIAGNOSIS — M67959 Unspecified disorder of synovium and tendon, unspecified thigh: Secondary | ICD-10-CM | POA: Diagnosis not present

## 2023-11-13 ENCOUNTER — Ambulatory Visit (HOSPITAL_BASED_OUTPATIENT_CLINIC_OR_DEPARTMENT_OTHER): Admitting: Orthopaedic Surgery

## 2023-11-13 ENCOUNTER — Encounter (HOSPITAL_BASED_OUTPATIENT_CLINIC_OR_DEPARTMENT_OTHER): Admitting: Orthopaedic Surgery

## 2023-11-13 DIAGNOSIS — M67951 Unspecified disorder of synovium and tendon, right thigh: Secondary | ICD-10-CM

## 2023-11-13 DIAGNOSIS — M67959 Unspecified disorder of synovium and tendon, unspecified thigh: Secondary | ICD-10-CM

## 2023-11-13 MED ORDER — LIDOCAINE HCL 1 % IJ SOLN
4.0000 mL | INTRAMUSCULAR | Status: AC | PRN
Start: 2023-11-13 — End: 2023-11-13
  Administered 2023-11-13: 4 mL

## 2023-11-13 MED ORDER — TRIAMCINOLONE ACETONIDE 40 MG/ML IJ SUSP
80.0000 mg | INTRAMUSCULAR | Status: AC | PRN
  Administered 2023-11-13: 80 mg via INTRA_ARTICULAR

## 2023-11-13 NOTE — Progress Notes (Signed)
 Post Operative Evaluation    Procedure/Date of Surgery: Right hip gluteus maximus tendon transfer 1/23  Interval History:   Pleasant 6-weeks status post the above procedure.  Presents today overall doing very well with regard to the right.  At today's visit she is having more left hip soreness than anything   PMH/PSH/Family History/Social History/Meds/Allergies:    Past Medical History:  Diagnosis Date  . Arthritis   . Complication of anesthesia   . GERD (gastroesophageal reflux disease)   . High cholesterol   . PONV (postoperative nausea and vomiting)    Past Surgical History:  Procedure Laterality Date  . CHOLECYSTECTOMY    . GLUTEUS MINIMUS REPAIR Right 09/24/2023   Procedure: RIGHT GLUTEUS MEDIUS REPAIR WITH COLLAGEN PATCH AUGMENTATION;  Surgeon: [name], MD;  Location: [facility] OR;  Service: Orthopedics;  Laterality: Right;  . HIP PINNING,CANNULATED Right 11/07/2021   Procedure: RIGHT HIP PERCUTANEOUS SCREW FIXATION;  Surgeon: [name], MD;  Location: [facility] OR;  Service: Orthopedics;  Laterality: Right;  . Right leg fracture s/p repair with 2 pins placed     Social History   Socioeconomic History  . Marital status: Widowed    Spouse name: Not on file  . Number of children: Not on file  . Years of education: Not on file  . Highest education level: Not on file  Occupational History  . Not on file  Tobacco Use  . Smoking status: Never  . Smokeless tobacco: Never  Vaping Use  . Vaping status: Never Used  Substance and Sexual Activity  . Alcohol use: Never  . Drug use: Never  . Sexual activity: Not on file  Other Topics Concern  . Not on file  Social History Narrative  . Not on file   Social Drivers of Health   Financial Resource Strain: Not on file  Food Insecurity: Not on file  Transportation Needs: Not on file  Physical Activity: Not on file  Stress: Not on file  Social Connections: Not on file   Family History   Problem Relation Age of Onset  . Heart disease Mother    No Known Allergies Current Outpatient Medications  Medication Sig Dispense Refill  . acetaminophen (TYLENOL) 650 MG CR tablet Take 1,300 mg by mouth every 8 (eight) hours as needed for pain.    Marland Kitchen aspirin EC 81 MG tablet Take 1 tablet (81 mg total) by mouth 2 (two) times daily. Continue taking once a day after a month 60 tablet 0  . Calcium Carbonate-Vitamin D (CALTRATE 600+D PO) Take 1 tablet by mouth daily.    . cetirizine (ZYRTEC) 10 MG tablet Take 10 mg by mouth daily.    . Coenzyme Q10 100 MG capsule Take 100 mg by mouth daily.    Marland Kitchen GEMTESA 75 MG TABS Take 1 tablet by mouth daily.    . Multiple Vitamin (MULTIVITAMIN WITH MINERALS) TABS tablet Take 1 tablet by mouth daily.     No current facility-administered medications for this visit.   No results found.  Review of Systems:   A ROS was performed including pertinent positives and negatives as documented in the HPI.   Musculoskeletal Exam:    There were no vitals taken for this visit.  Right hip lateral incision is well-healed.  30 degrees internal/external rotation of the right hip.  Gait is improving.  Walks with just a cane in the right hand  Tenderness about the left hip laterally and over the trochanter  Imaging:     I personally reviewed and interpreted the radiographs.   Assessment:   89 year old female presents today 6 weeks status post right gluteus maximus tendon transfer.  Overall she is doing extremely well.  Her left hip has been bothering her significantly consistent with again gluteus tendinopathy.  At this time I have recommended ultrasound-guided injection of the left hip  Plan :    -Return to clinic as needed    Procedure Note  Patient: Shelby Chan             Date of Birth: 1933/08/27           MRN: [id number]             Visit Date: 11/13/2023  Procedures: Visit Diagnoses: No diagnosis found.  Large Joint Inj: L greater  trochanter on 11/13/2023 8:52 AM Indications: pain Details: 22 G 3.5 in needle, ultrasound-guided anterolateral approach  Arthrogram: No  Medications: 4 mL lidocaine 1 %; 80 mg triamcinolone acetonide 40 MG/ML Outcome: tolerated well, no immediate complications Procedure, treatment alternatives, risks and benefits explained, specific risks discussed. Consent was given by the patient. Immediately prior to procedure a time out was called to verify the correct patient, procedure, equipment, support staff and site/side marked as required. Patient was prepped and draped in the usual sterile fashion.       I personally saw and evaluated the patient, and participated in the management and treatment plan.  [name], MD Attending Physician, Orthopedic Surgery  This document was dictated using Dragon voice recognition software. A reasonable attempt at proof reading has been made to minimize errors.

## 2023-11-16 DIAGNOSIS — M67959 Unspecified disorder of synovium and tendon, unspecified thigh: Secondary | ICD-10-CM | POA: Diagnosis not present

## 2023-11-16 DIAGNOSIS — R269 Unspecified abnormalities of gait and mobility: Secondary | ICD-10-CM | POA: Diagnosis not present

## 2023-11-16 DIAGNOSIS — M6281 Muscle weakness (generalized): Secondary | ICD-10-CM | POA: Diagnosis not present

## 2023-11-23 DIAGNOSIS — M6281 Muscle weakness (generalized): Secondary | ICD-10-CM | POA: Diagnosis not present

## 2023-11-23 DIAGNOSIS — R269 Unspecified abnormalities of gait and mobility: Secondary | ICD-10-CM | POA: Diagnosis not present

## 2023-11-23 DIAGNOSIS — M67959 Unspecified disorder of synovium and tendon, unspecified thigh: Secondary | ICD-10-CM | POA: Diagnosis not present

## 2023-12-07 DIAGNOSIS — M67959 Unspecified disorder of synovium and tendon, unspecified thigh: Secondary | ICD-10-CM | POA: Diagnosis not present

## 2023-12-07 DIAGNOSIS — R269 Unspecified abnormalities of gait and mobility: Secondary | ICD-10-CM | POA: Diagnosis not present

## 2023-12-07 DIAGNOSIS — M6281 Muscle weakness (generalized): Secondary | ICD-10-CM | POA: Diagnosis not present

## 2023-12-09 DIAGNOSIS — R269 Unspecified abnormalities of gait and mobility: Secondary | ICD-10-CM | POA: Diagnosis not present

## 2023-12-09 DIAGNOSIS — M67959 Unspecified disorder of synovium and tendon, unspecified thigh: Secondary | ICD-10-CM | POA: Diagnosis not present

## 2023-12-09 DIAGNOSIS — M6281 Muscle weakness (generalized): Secondary | ICD-10-CM | POA: Diagnosis not present

## 2023-12-14 DIAGNOSIS — M6281 Muscle weakness (generalized): Secondary | ICD-10-CM | POA: Diagnosis not present

## 2023-12-14 DIAGNOSIS — R269 Unspecified abnormalities of gait and mobility: Secondary | ICD-10-CM | POA: Diagnosis not present

## 2023-12-14 DIAGNOSIS — M67959 Unspecified disorder of synovium and tendon, unspecified thigh: Secondary | ICD-10-CM | POA: Diagnosis not present

## 2023-12-16 DIAGNOSIS — M67959 Unspecified disorder of synovium and tendon, unspecified thigh: Secondary | ICD-10-CM | POA: Diagnosis not present

## 2023-12-16 DIAGNOSIS — R269 Unspecified abnormalities of gait and mobility: Secondary | ICD-10-CM | POA: Diagnosis not present

## 2023-12-16 DIAGNOSIS — M6281 Muscle weakness (generalized): Secondary | ICD-10-CM | POA: Diagnosis not present

## 2023-12-28 ENCOUNTER — Encounter (HOSPITAL_BASED_OUTPATIENT_CLINIC_OR_DEPARTMENT_OTHER): Payer: Self-pay | Admitting: Orthopaedic Surgery

## 2023-12-28 DIAGNOSIS — R269 Unspecified abnormalities of gait and mobility: Secondary | ICD-10-CM | POA: Diagnosis not present

## 2023-12-28 DIAGNOSIS — M6281 Muscle weakness (generalized): Secondary | ICD-10-CM | POA: Diagnosis not present

## 2023-12-28 DIAGNOSIS — M67959 Unspecified disorder of synovium and tendon, unspecified thigh: Secondary | ICD-10-CM | POA: Diagnosis not present

## 2024-01-04 DIAGNOSIS — M6281 Muscle weakness (generalized): Secondary | ICD-10-CM | POA: Diagnosis not present

## 2024-01-04 DIAGNOSIS — R269 Unspecified abnormalities of gait and mobility: Secondary | ICD-10-CM | POA: Diagnosis not present

## 2024-01-04 DIAGNOSIS — M67959 Unspecified disorder of synovium and tendon, unspecified thigh: Secondary | ICD-10-CM | POA: Diagnosis not present

## 2024-01-08 ENCOUNTER — Other Ambulatory Visit: Payer: Self-pay

## 2024-01-08 ENCOUNTER — Emergency Department (HOSPITAL_COMMUNITY)

## 2024-01-08 ENCOUNTER — Encounter (HOSPITAL_COMMUNITY): Payer: Self-pay | Admitting: *Deleted

## 2024-01-08 ENCOUNTER — Inpatient Hospital Stay (HOSPITAL_COMMUNITY)
Admission: EM | Admit: 2024-01-08 | Discharge: 2024-01-10 | DRG: 177 | Disposition: A | Discharge status: Home / Self Care | Attending: Student | Admitting: Student

## 2024-01-08 DIAGNOSIS — Z6831 Body mass index (BMI) 31.0-31.9, adult: Secondary | ICD-10-CM | POA: Diagnosis not present

## 2024-01-08 DIAGNOSIS — E860 Dehydration: Secondary | ICD-10-CM | POA: Diagnosis present

## 2024-01-08 DIAGNOSIS — R531 Weakness: Secondary | ICD-10-CM

## 2024-01-08 DIAGNOSIS — I6782 Cerebral ischemia: Secondary | ICD-10-CM | POA: Diagnosis not present

## 2024-01-08 DIAGNOSIS — M1711 Unilateral primary osteoarthritis, right knee: Secondary | ICD-10-CM | POA: Diagnosis not present

## 2024-01-08 DIAGNOSIS — K219 Gastro-esophageal reflux disease without esophagitis: Secondary | ICD-10-CM | POA: Diagnosis not present

## 2024-01-08 DIAGNOSIS — R748 Abnormal levels of other serum enzymes: Secondary | ICD-10-CM | POA: Diagnosis present

## 2024-01-08 DIAGNOSIS — G9340 Encephalopathy, unspecified: Principal | ICD-10-CM

## 2024-01-08 DIAGNOSIS — E861 Hypovolemia: Secondary | ICD-10-CM | POA: Diagnosis present

## 2024-01-08 DIAGNOSIS — R630 Anorexia: Secondary | ICD-10-CM | POA: Diagnosis present

## 2024-01-08 DIAGNOSIS — Z9049 Acquired absence of other specified parts of digestive tract: Secondary | ICD-10-CM

## 2024-01-08 DIAGNOSIS — W1839XA Other fall on same level, initial encounter: Secondary | ICD-10-CM | POA: Diagnosis present

## 2024-01-08 DIAGNOSIS — M810 Age-related osteoporosis without current pathological fracture: Secondary | ICD-10-CM | POA: Diagnosis not present

## 2024-01-08 DIAGNOSIS — U071 COVID-19: Principal | ICD-10-CM | POA: Diagnosis present

## 2024-01-08 DIAGNOSIS — J9811 Atelectasis: Secondary | ICD-10-CM | POA: Diagnosis not present

## 2024-01-08 DIAGNOSIS — R4182 Altered mental status, unspecified: Secondary | ICD-10-CM | POA: Diagnosis not present

## 2024-01-08 DIAGNOSIS — N390 Urinary tract infection, site not specified: Secondary | ICD-10-CM

## 2024-01-08 DIAGNOSIS — Z96641 Presence of right artificial hip joint: Secondary | ICD-10-CM | POA: Diagnosis present

## 2024-01-08 DIAGNOSIS — Z79899 Other long term (current) drug therapy: Secondary | ICD-10-CM

## 2024-01-08 DIAGNOSIS — Z8673 Personal history of transient ischemic attack (TIA), and cerebral infarction without residual deficits: Secondary | ICD-10-CM

## 2024-01-08 DIAGNOSIS — Z7982 Long term (current) use of aspirin: Secondary | ICD-10-CM

## 2024-01-08 DIAGNOSIS — Y92008 Other place in unspecified non-institutional (private) residence as the place of occurrence of the external cause: Secondary | ICD-10-CM

## 2024-01-08 DIAGNOSIS — G9341 Metabolic encephalopathy: Secondary | ICD-10-CM | POA: Diagnosis not present

## 2024-01-08 DIAGNOSIS — E66811 Obesity, class 1: Secondary | ICD-10-CM | POA: Diagnosis not present

## 2024-01-08 DIAGNOSIS — M1990 Unspecified osteoarthritis, unspecified site: Secondary | ICD-10-CM | POA: Diagnosis not present

## 2024-01-08 DIAGNOSIS — M25562 Pain in left knee: Secondary | ICD-10-CM | POA: Diagnosis not present

## 2024-01-08 DIAGNOSIS — E876 Hypokalemia: Secondary | ICD-10-CM | POA: Diagnosis not present

## 2024-01-08 DIAGNOSIS — M25561 Pain in right knee: Secondary | ICD-10-CM | POA: Diagnosis not present

## 2024-01-08 DIAGNOSIS — Z8249 Family history of ischemic heart disease and other diseases of the circulatory system: Secondary | ICD-10-CM | POA: Diagnosis not present

## 2024-01-08 DIAGNOSIS — M1612 Unilateral primary osteoarthritis, left hip: Secondary | ICD-10-CM | POA: Diagnosis not present

## 2024-01-08 DIAGNOSIS — E7800 Pure hypercholesterolemia, unspecified: Secondary | ICD-10-CM | POA: Diagnosis not present

## 2024-01-08 DIAGNOSIS — Z043 Encounter for examination and observation following other accident: Secondary | ICD-10-CM | POA: Diagnosis not present

## 2024-01-08 DIAGNOSIS — N3000 Acute cystitis without hematuria: Secondary | ICD-10-CM | POA: Diagnosis not present

## 2024-01-08 DIAGNOSIS — R0989 Other specified symptoms and signs involving the circulatory and respiratory systems: Secondary | ICD-10-CM | POA: Diagnosis not present

## 2024-01-08 DIAGNOSIS — M1712 Unilateral primary osteoarthritis, left knee: Secondary | ICD-10-CM | POA: Diagnosis not present

## 2024-01-08 DIAGNOSIS — E871 Hypo-osmolality and hyponatremia: Secondary | ICD-10-CM | POA: Diagnosis not present

## 2024-01-08 DIAGNOSIS — S72002A Fracture of unspecified part of neck of left femur, initial encounter for closed fracture: Secondary | ICD-10-CM | POA: Diagnosis not present

## 2024-01-08 DIAGNOSIS — S0990XA Unspecified injury of head, initial encounter: Secondary | ICD-10-CM | POA: Diagnosis not present

## 2024-01-08 DIAGNOSIS — M25552 Pain in left hip: Secondary | ICD-10-CM | POA: Diagnosis not present

## 2024-01-08 DIAGNOSIS — R9089 Other abnormal findings on diagnostic imaging of central nervous system: Secondary | ICD-10-CM | POA: Diagnosis not present

## 2024-01-08 LAB — CBC WITH DIFFERENTIAL/PLATELET
Abs Immature Granulocytes: 0.05 10*3/uL (ref 0.00–0.07)
Basophils Absolute: 0 10*3/uL (ref 0.0–0.1)
Basophils Relative: 0 %
Eosinophils Absolute: 0 10*3/uL (ref 0.0–0.5)
Eosinophils Relative: 0 %
HCT: 43.9 % (ref 36.0–46.0)
Hemoglobin: 14.7 g/dL (ref 12.0–15.0)
Immature Granulocytes: 0 %
Lymphocytes Relative: 12 %
Lymphs Abs: 1.4 10*3/uL (ref 0.7–4.0)
MCH: 31.1 pg (ref 26.0–34.0)
MCHC: 33.5 g/dL (ref 30.0–36.0)
MCV: 93 fL (ref 80.0–100.0)
Monocytes Absolute: 1.3 10*3/uL — ABNORMAL HIGH (ref 0.1–1.0)
Monocytes Relative: 10 %
Neutro Abs: 9.5 10*3/uL — ABNORMAL HIGH (ref 1.7–7.7)
Neutrophils Relative %: 78 %
Platelets: 223 10*3/uL (ref 150–400)
RBC: 4.72 MIL/uL (ref 3.87–5.11)
RDW: 12.4 % (ref 11.5–15.5)
WBC: 12.3 10*3/uL — ABNORMAL HIGH (ref 4.0–10.5)
nRBC: 0 % (ref 0.0–0.2)

## 2024-01-08 LAB — COMPREHENSIVE METABOLIC PANEL WITH GFR
ALT: 25 U/L (ref 0–44)
AST: 31 U/L (ref 15–41)
Albumin: 4.2 g/dL (ref 3.5–5.0)
Alkaline Phosphatase: 53 U/L (ref 38–126)
Anion gap: 12 (ref 5–15)
BUN: 17 mg/dL (ref 8–23)
CO2: 24 mmol/L (ref 22–32)
Calcium: 9.6 mg/dL (ref 8.9–10.3)
Chloride: 98 mmol/L (ref 98–111)
Creatinine, Ser: 0.69 mg/dL (ref 0.44–1.00)
GFR, Estimated: >60 mL/min (ref >60)
Glucose, Bld: 122 mg/dL — ABNORMAL HIGH (ref 70–99)
Potassium: 3.4 mmol/L — ABNORMAL LOW (ref 3.5–5.1)
Sodium: 134 mmol/L — ABNORMAL LOW (ref 135–145)
Total Bilirubin: 0.8 mg/dL (ref 0.0–1.2)
Total Protein: 7.1 g/dL (ref 6.5–8.1)

## 2024-01-08 LAB — URINALYSIS, ROUTINE W REFLEX MICROSCOPIC
Bilirubin Urine: NEGATIVE
Glucose, UA: NEGATIVE mg/dL
Ketones, ur: 5 mg/dL — AB
Leukocytes,Ua: NEGATIVE
Nitrite: POSITIVE — AB
Protein, ur: 30 mg/dL — AB
Specific Gravity, Urine: 1.018 (ref 1.005–1.030)
WBC, UA: >50 WBC/hpf (ref 0–5)
pH: 6 (ref 5.0–8.0)

## 2024-01-08 LAB — CK: Total CK: 297 U/L — ABNORMAL HIGH (ref 38–234)

## 2024-01-08 LAB — RESP PANEL BY RT-PCR (RSV, FLU A&B, COVID)  RVPGX2
Influenza A by PCR: NEGATIVE
Influenza B by PCR: NEGATIVE
Resp Syncytial Virus by PCR: NEGATIVE
SARS Coronavirus 2 by RT PCR: POSITIVE — AB

## 2024-01-08 LAB — MAGNESIUM: Magnesium: 2 mg/dL (ref 1.7–2.4)

## 2024-01-08 MED ORDER — ADULT MULTIVITAMIN W/MINERALS CH
1.0000 | ORAL_TABLET | Freq: Every day | ORAL | Status: DC
  Administered 2024-01-09 – 2024-01-10 (×2): 1 via ORAL
  Filled 2024-01-08 (×2): qty 1

## 2024-01-08 MED ORDER — SODIUM CHLORIDE 0.9 % IV SOLN
1.0000 g | INTRAVENOUS | Status: DC
  Administered 2024-01-09: 1 g via INTRAVENOUS
  Filled 2024-01-08: qty 10

## 2024-01-08 MED ORDER — NIRMATRELVIR/RITONAVIR (PAXLOVID)TABLET: 3.0000 | ORAL_TABLET | Freq: Two times a day (BID) | ORAL | Status: DC

## 2024-01-08 MED ORDER — PANTOPRAZOLE SODIUM 40 MG PO TBEC
40.0000 mg | DELAYED_RELEASE_TABLET | Freq: Every day | ORAL | Status: DC
  Administered 2024-01-09 – 2024-01-10 (×2): 40 mg via ORAL
  Filled 2024-01-08 (×2): qty 1

## 2024-01-08 MED ORDER — LORATADINE 10 MG PO TABS
10.0000 mg | ORAL_TABLET | Freq: Every day | ORAL | Status: DC
  Administered 2024-01-09 – 2024-01-10 (×2): 10 mg via ORAL
  Filled 2024-01-08 (×2): qty 1

## 2024-01-08 MED ORDER — SODIUM CHLORIDE 0.9 % IV SOLN
1.0000 g | Freq: Once | INTRAVENOUS | Status: AC
  Administered 2024-01-08: 1 g via INTRAVENOUS
  Filled 2024-01-08: qty 10

## 2024-01-08 MED ORDER — LACTATED RINGERS IV BOLUS
1000.0000 mL | Freq: Once | INTRAVENOUS | Status: AC
  Administered 2024-01-08: 1000 mL via INTRAVENOUS

## 2024-01-08 MED ORDER — ONDANSETRON HCL 4 MG/2ML IJ SOLN: 4.0000 mg | Freq: Four times a day (QID) | INTRAMUSCULAR | Status: DC | PRN

## 2024-01-08 MED ORDER — ONDANSETRON HCL 4 MG PO TABS: 4.0000 mg | ORAL_TABLET | Freq: Four times a day (QID) | ORAL | Status: DC | PRN

## 2024-01-08 MED ORDER — ENOXAPARIN SODIUM 40 MG/0.4ML IJ SOSY
40.0000 mg | PREFILLED_SYRINGE | INTRAMUSCULAR | Status: DC
  Administered 2024-01-08 – 2024-01-09 (×2): 40 mg via SUBCUTANEOUS
  Filled 2024-01-08 (×2): qty 0.4

## 2024-01-08 MED ORDER — ADULT MULTIVITAMIN W/MINERALS CH: 1.0000 | ORAL_TABLET | Freq: Every day | ORAL | Status: DC

## 2024-01-08 MED ORDER — DEXTROSE-SODIUM CHLORIDE 5-0.9 % IV SOLN
INTRAVENOUS | Status: DC
Start: 2024-01-09 — End: 2024-01-10

## 2024-01-08 MED ORDER — ACETAMINOPHEN 650 MG RE SUPP: 650.0000 mg | Freq: Four times a day (QID) | RECTAL | Status: DC | PRN

## 2024-01-08 MED ORDER — ACETAMINOPHEN 325 MG PO TABS: 650.0000 mg | ORAL_TABLET | Freq: Four times a day (QID) | ORAL | Status: DC | PRN

## 2024-01-08 NOTE — Progress Notes (Signed)
   01/08/24 2135  TOC Brief Assessment  Insurance and Status Reviewed  Patient has primary care physician Yes  Home environment has been reviewed From retirement community  Prior level of function: Independent  Prior/Current Home Services No current home services  Social Drivers of Health Review SDOH reviewed no interventions necessary  Readmission risk has been reviewed Yes  Transition of care needs no transition of care needs at this time   Transition of Care Department [facility]) has reviewed patient and will continue to monitor.

## 2024-01-08 NOTE — Assessment & Plan Note (Signed)
 Multifactorial encephalopathy.  Continue neuro checks per unit protocol.  Supportive medical therapy with IV fluids along with antibiotic and antiviral therapy for urine infection and viral infection  PT and OT

## 2024-01-08 NOTE — Assessment & Plan Note (Signed)
 Patient with no signs of acute viral pneumonia.   She has advanced age and risk for complications.  She is within 5 days of infection, will add antiviral therapy with Paxlovid Hold on systemic corticosteroids for now.  Continue to follow response to supportive medical therapy with IV fluids and as needed antipyretics.

## 2024-01-08 NOTE — ED Provider Notes (Signed)
 [place] EMERGENCY DEPARTMENT AT [facility] Provider Note   CSN: [id number] Arrival date & time: 01/08/24  1235     History  Chief Complaint  Patient presents with   Shelby Chan is a 89 y.o. female.  HPI Patient presents with daughter who assists with history. Patient is a generally functional elderly female living in an independent living facility.  She did not show up for dinner last night, nor breakfast today, and now presents midday after being found on the ground by nursing home staff.  Per daughter patient is typically ANO x 3, sharp, this currently forgetful, mildly disoriented.  Patient complains of knee pain, hip pain, generalized weakness.  History is notable for right hip replacement in the distant past with revision.    Home Medications Prior to Admission medications   Medication Sig Start Date End Date Taking? Authorizing Provider  acetaminophen  (TYLENOL ) 650 MG CR tablet Take 1,300 mg by mouth every 8 (eight) hours as needed for pain.    [provider]  aspirin  EC 81 MG tablet Take 1 tablet (81 mg total) by mouth 2 (two) times daily. Continue taking once a day after a month 11/13/21   Leona Rake, MD  Calcium Carbonate-Vitamin D  (CALTRATE 600+D PO) Take 1 tablet by mouth daily.    [provider]  cetirizine (ZYRTEC) 10 MG tablet Take 10 mg by mouth daily.    [provider]  Coenzyme Q10 100 MG capsule Take 100 mg by mouth daily.    [provider]  GEMTESA 75 MG TABS Take 1 tablet by mouth daily. 08/11/23   [provider]  Multiple Vitamin (MULTIVITAMIN WITH MINERALS) TABS tablet Take 1 tablet by mouth daily.    [provider]      Allergies    Patient has no known allergies.    Review of Systems   Review of Systems  Physical Exam Updated Vital Signs BP 123/60 (BP Location: Right Arm)   Pulse 78   Temp 99.7 F (37.6 C) (Oral)   Resp 17   Ht 5\' 4"  (1.626 m)   Wt 76 kg    SpO2 97%   BMI 28.76 kg/m  Physical Exam Vitals and nursing note reviewed.  Constitutional:      General: She is not in acute distress.    Appearance: She is well-developed.  HENT:     Head: Normocephalic and atraumatic.  Eyes:     Conjunctiva/sclera: Conjunctivae normal.  Cardiovascular:     Rate and Rhythm: Normal rate and regular rhythm.  Pulmonary:     Effort: Pulmonary effort is normal. No respiratory distress.     Breath sounds: Normal breath sounds. No stridor.  Abdominal:     General: There is no distension.  Musculoskeletal:     Comments: No obvious deformities patient flexes each hip, both knees, and extends all 4 joints to command.  She does describe pain in her knees, however.  Skin:    General: Skin is warm and dry.  Neurological:     Mental Status: She is alert.     Cranial Nerves: No cranial nerve deficit.     Motor: Atrophy present. No tremor.     Comments: Patient with disorientation, date, roughly oriented to place, to self.  Psychiatric:        Mood and Affect: Mood normal.     ED Results / Procedures / Treatments   Labs (all labs ordered are listed, but  only abnormal results are displayed) Labs Reviewed  RESP PANEL BY RT-PCR (RSV, FLU A&B, COVID)  RVPGX2 - Abnormal; Notable for the following components:      Result Value   SARS Coronavirus 2 by RT PCR POSITIVE (*)    All other components within normal limits  CBC WITH DIFFERENTIAL/PLATELET - Abnormal; Notable for the following components:   WBC 12.3 (*)    Neutro Abs 9.5 (*)    Monocytes Absolute 1.3 (*)    All other components within normal limits  COMPREHENSIVE METABOLIC PANEL WITH GFR - Abnormal; Notable for the following components:   Sodium 134 (*)    Potassium 3.4 (*)    Glucose, Bld 122 (*)    All other components within normal limits  URINALYSIS, ROUTINE W REFLEX MICROSCOPIC - Abnormal; Notable for the following components:   APPearance CLOUDY (*)    Hgb urine dipstick SMALL (*)     Ketones, ur 5 (*)    Protein, ur 30 (*)    Nitrite POSITIVE (*)    Bacteria, UA MANY (*)    Non Squamous Epithelial 0-5 (*)    All other components within normal limits  CK - Abnormal; Notable for the following components:   Total CK 297 (*)    All other components within normal limits  MAGNESIUM    EKG None  Radiology MR BRAIN WO CONTRAST Result Date: 01/08/2024 CLINICAL DATA:  Mental status change, found down on floor. EXAM: MRI HEAD WITHOUT CONTRAST TECHNIQUE: Multiplanar, multiecho pulse sequences of the brain and surrounding structures were obtained without intravenous contrast. COMPARISON:  CT head earlier same day. FINDINGS: Brain: No acute infarct. Scattered and confluent T2/FLAIR hyperintensity in the periventricular and subcortical white matter suggestive of chronic microvascular ischemic changes. There are numerous foci suggestive of prominent perivascular spaces with possible superimposed remote lacunar infarcts involving the basal ganglia and corona radiata as well as the left frontal lobe white matter. Remote infarcts noted in the pons and right cerebellar white matter. Susceptibility along multiple sulci of the anterior left frontal lobe suggestive of remote hemorrhage. No mass lesion or midline shift. Posterior fossa is unremarkable. Normal appearance of midline structures. The basilar cisterns are patent. No extra-axial fluid collections. Ventricles: Prominence of the lateral ventricles suggestive of underlying parenchymal volume loss. Vascular: Skull base flow voids are visualized. Skull and upper cervical spine: Degenerative changes in the visualized cervical spine. Visualized calvarium is unremarkable. 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 Sinuses/Orbits: Scattered mucosal thickening throughout the paranasal sinuses most pronounced in the ethmoid sinuses. Bilateral lens replacement. Other: Small right mastoid effusion. IMPRESSION: No acute intracranial  abnormality. Moderate chronic microvascular ischemic changes and mild parenchymal volume loss. 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 There are prominent perivascular spaces in the bilateral basal ganglia and corona radiata with likely superimposed component of remote lacunar infarcts. Susceptibility over the anterior left frontal lobe suggestive of remote subarachnoid hemorrhage. Small right mastoid effusion. Electronically Signed   By: Denny Flack M.D.   On: 01/08/2024 16:54   DG Knee Complete 4 Views Right Result Date: 01/08/2024 CLINICAL DATA:  Knee pain after fall. EXAM: RIGHT KNEE - COMPLETE 4+ VIEW COMPARISON:  None Available. FINDINGS: No evidence of acute fracture or dislocation. Two screws traverse the proximal tibia. Moderate tricompartmental peripheral spurring. Multiple ossified intra-articular bodies posteriorly. No significant joint effusion. No focal soft tissue abnormalities. IMPRESSION: 1. No acute fracture or dislocation. 2. 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 Electronically Signed   By: Chadwick Colonel M.D.   On: 01/08/2024 15:42   DG Knee Complete 4 Views Left Result Date: 01/08/2024 CLINICAL DATA:  Knee pain after fall. EXAM: LEFT KNEE - COMPLETE 4+ VIEW COMPARISON:  None Available. FINDINGS: No acute fracture or dislocation. The joint spaces are preserved. Mild tricompartmental peripheral spurring. No erosion or focal bone abnormality. Trace quadriceps tendon enthesophyte. IMPRESSION: 1. No acute fracture or subluxation of the left knee. 2. Mild tricompartmental osteoarthritis. Electronically Signed   By: Chadwick Colonel M.D.   On: 01/08/2024 15:41   DG Chest 1 View Result Date: 01/08/2024 CLINICAL DATA:  Unwitnessed fall. EXAM: CHEST  1 VIEW COMPARISON:  11/11/2021 FINDINGS: Lung volumes are low. Unchanged heart size. Stable mediastinal contours. Bibasilar atelectasis. No pneumothorax, large pleural  effusion or pulmonary edema. On limited assessment, no evidence of acute rib fracture. IMPRESSION: Low lung volumes with bibasilar atelectasis. Electronically Signed   By: Chadwick Colonel M.D.   On: 01/08/2024 15:40   DG Hip Unilat W or Wo Pelvis 2-3 Views Left Result Date: 01/08/2024 CLINICAL DATA:  Hip pain after fall. EXAM: DG HIP (WITH OR WITHOUT PELVIS) 2-3V LEFT COMPARISON:  Pelvis radiograph from right hip series 05/27/2023 FINDINGS: No evidence of acute fracture of the pelvis or left hip. The cortical margins are intact. No hip dislocation, mild left hip degenerative change. Mild enthesopathic change about the greater trochanter. The pubic rami are intact. No pubic symphyseal or sacroiliac diastasis. Screws traverse right femoral neck with healed fracture. IMPRESSION: 1. No acute fracture or subluxation of the left hip. 2. Mild left hip degenerative change. Electronically Signed   By: Chadwick Colonel M.D.   On: 01/08/2024 15:39   CT Head Wo Contrast Result Date: 01/08/2024 CLINICAL DATA:  Found on the floor.  Trauma to the head. EXAM: CT HEAD WITHOUT CONTRAST TECHNIQUE: Contiguous axial images were obtained from the base of the skull through the vertex without intravenous contrast. RADIATION DOSE REDUCTION: This exam was performed according to the departmental dose-optimization program which includes automated exposure control, adjustment of the mA and/or kV according to patient size and/or use of iterative reconstruction technique. COMPARISON:  None Available. FINDINGS: Brain: Age related volume loss. Chronic small-vessel ischemic changes of the white matter. No sign of recent stroke, mass, hemorrhage, hydrocephalus or extra-axial collection. Vascular: There is atherosclerotic calcification of the major vessels at the base of the brain. Skull: Negative Sinuses/Orbits: Clear/normal Other: None IMPRESSION: No acute or traumatic finding. Age related volume loss. Chronic small-vessel ischemic changes of  the white matter. Electronically Signed   By: Bettylou Brunner M.D.   On: 01/08/2024 14:22    Procedures Procedures    Medications Ordered in ED Medications  cefTRIAXone (ROCEPHIN) 1 g in sodium chloride  0.9 % 100 mL IVPB (has no administration in time range)  lactated ringers  bolus 1,000 mL (1,000 mLs Intravenous New Bag/Given 01/08/24 1850)    ED Course/ Medical Decision Making/ A&P                                 Medical Decision Making Elderly female presents after being found down, possibly as long as 18 hours, now with confusion.  Broad differential including dehydration, stroke, fall, head trauma, musculoskeletal injury, electrolyte abnormalities, dehydration. Cardiac 90 sinus normal pulse ox 98% room air normal  Amount and/or Complexity of Data Reviewed Independent Historian:     Details: Daughter bedside  External Data Reviewed: notes. Labs:  Decision-making details documented in ED Course. Radiology: ordered and independent interpretation performed. Decision-making details documented in ED Course.  7:24 PM Cardiac 80 sinus normal Pulse ox 97% room air normal  [place] labs are now available, findings notable for evidence for urinary tract infection, and further evidence for dehydration with ketone urea.  With patient's initial CK elevation, suspicion for dehydration urinary tract infection contributing to her encephalopathy.  Patient also now COVID-positive, further contributing to her confusion.  No evidence for bacteremia, sepsis, but with concern for above, patient be admitted for further monitoring, management.  Ceftriaxone started, fluids running.   Final Clinical Impression(s) / ED Diagnoses Final diagnoses:  Acute encephalopathy  Acute cystitis without hematuria  COVID  Dehydration     Dorenda Gandy, MD 01/08/24 Carlette Cheers

## 2024-01-08 NOTE — H&P (Signed)
 History and Physical    Patient: Shelby Chan BJY:[id number] DOB: 31-Mar-1933 DOA: 01/08/2024 DOS: the patient was seen and examined on 01/08/2024 PCP: Omie Bickers, MD  Patient coming from: Home independent living facility   Chief Complaint:  Chief Complaint  Patient presents with   Fall   HPI: Shelby Chan is a 89 y.o. female with medical history significant of arthritis, GERD, and dyslipidemia who presented with altered mental status.  Patient has been at her usual state of health until 24 hr prior to her admission. She was noted to have nasal congestion, coughing and generalized weakness. Very poor appetite and poor oral intake. She lives in independent living facility. Patient was seen at dinner last night by her friends and then she returned alone to her bedroom. The next morning she did not show up for breakfast and her friend went to her room looking for her. Her door was unlocked and patient was found on the floor covered with urine. Apparently she spent all night on the floor. Not clear mechanism of fall and is not clear if she had head trauma or syncope.  She was noted to be confused and disorientated, not able to stand back on her feet. Because severity of her symptoms she was brought to the hospital.   Sick contact with her son 4 days ago, who later was found to have positive tet for COVID 19.   Patient at baseline is very functional, does not have any cognitive impairment and is able to ambulate with no walker or cane.  All information is obtained from her daughter at the beside. Patient is not able to recall any details from what happen over the last 24 hrs.   She denies dysuria, pelvic abdominal pain or increased urinary frequency.    Review of Systems: As mentioned in the history of present illness. All other systems reviewed and are negative. Past Medical History:  Diagnosis Date   Arthritis    Complication of anesthesia    GERD (gastroesophageal reflux disease)     High cholesterol    PONV (postoperative nausea and vomiting)    Past Surgical History:  Procedure Laterality Date   CHOLECYSTECTOMY     GLUTEUS MINIMUS REPAIR Right 09/24/2023   Procedure: RIGHT GLUTEUS MEDIUS REPAIR WITH COLLAGEN PATCH AUGMENTATION;  Surgeon: Wilhelmenia Harada, MD;  Location: [facility] OR;  Service: Orthopedics;  Laterality: Right;   HIP PINNING,CANNULATED Right 11/07/2021   Procedure: RIGHT HIP PERCUTANEOUS SCREW FIXATION;  Surgeon: Wilhelmenia Harada, MD;  Location: [facility] OR;  Service: Orthopedics;  Laterality: Right;   Right leg fracture s/p repair with 2 pins placed     Social History:  reports that she has never smoked. She has never used smokeless tobacco. She reports that she does not drink alcohol and does not use drugs.  No Known Allergies  Family History  Problem Relation Age of Onset   Heart disease Mother     Prior to Admission medications   Medication Sig Start Date End Date Taking? Authorizing Provider  acetaminophen  (TYLENOL ) 650 MG CR tablet Take 1,300 mg by mouth every 8 (eight) hours as needed for pain.    [provider]  aspirin  EC 81 MG tablet Take 1 tablet (81 mg total) by mouth 2 (two) times daily. Continue taking once a day after a month 11/13/21   Leona Rake, MD  Calcium Carbonate-Vitamin D  (CALTRATE 600+D PO) Take 1 tablet by mouth daily.    [provider]  cetirizine Alycia Babcock)  10 MG tablet Take 10 mg by mouth daily.    [provider]  Coenzyme Q10 100 MG capsule Take 100 mg by mouth daily.    [provider]  GEMTESA 75 MG TABS Take 1 tablet by mouth daily. 08/11/23   [provider]  Multiple Vitamin (MULTIVITAMIN WITH MINERALS) TABS tablet Take 1 tablet by mouth daily.    [provider]    Physical Exam: Vitals:   01/08/24 1307 01/08/24 1308 01/08/24 1629 01/08/24 1900  BP:  122/65 123/60 136/71  Pulse:  91 78 80  Resp:  18 17 16   Temp:  98.2 F (36.8 C) 99.7 F (37.6 C)   TempSrc:  Oral  Oral   SpO2:  98% 97% 97%  Weight: 76 kg     Height: 5\' 4"  (1.626 m)      Neurology awake and alert, she is disorientated in place and situation, not in person. Her strength is appropriate as well as coordination. Cranial nerves 2 to 12 are intact  ENT with mild pallor, oral mucosa dry  Cardiovascular with S1 and S2 present and regular with no gallops, rubs or murmurs Respiratory with no rales or wheezing, positive scattered  rhonchi  Abdomen with no distention  No lower extremity edema   Data Reviewed:   Na 134, K 3,4 Cl 98 bicarbonate 24, glucose 122 bun 17 cr 0,69  AST 31 ALT 25  Ck 297  Wbc 12.3 hgb 14.7 plt 223   Sars covid 19 positive Influenza negative  RSV negative   Urine analysis SG 1,018, protein 30, positive nitrates, small hgb and negative leukocytes, > 50 wbc, 0.5 rbc, many bacteria   CT head with no acute or traumatic finding. Age related volume loss. Chronic small vessel ischemic changes of the white matter.  MRI brain with no acute abnormalities. Moderate chronic microvascular changes and mild parenchymal volume loss.  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 Remote lacunar infarcts, bilateral basal ganglia and corona radiata.  Anterior left frontal lobe findings suggestive of remote subarachnoid hemorrhage.   Chest radiograph with hypoinflation, positive cardiomegaly and bilateral atelectasis   Hip radiograph with no fracture or subluxation of the left hip.  Bilateral knees radiograph with no acute fracture or subluxation   EKG 76 bpm, normal axis, right bundle branch block, qtc 443, sinus rhythm with no significant ST segment or T wave changes.   Assessment and Plan: * COVID-19 virus infection Patient with no signs of acute viral pneumonia.   She has advanced age and risk for complications.  She is within 5 days of infection, will add antiviral therapy with Paxlovid Hold on systemic corticosteroids for now.  Continue to  follow response to supportive medical therapy with IV fluids and as needed antipyretics.   Acute metabolic encephalopathy Multifactorial encephalopathy.  Continue neuro checks per unit protocol.  Supportive medical therapy with IV fluids along with antibiotic and antiviral therapy for urine infection and viral infection  PT and OT   Hypokalemia Hyponatremia.  Hypovolemic.   Plan to continue hydration with dextrose  and isotonic saline at 100 ml per hr Follow up renal function and electrolytes in am.  Elevated Ck not in rhabdomyolysis range, but will need trend.  Avoid hypotension and nephrotoxic medications.   Age-related osteoporosis without current pathological fracture Follow up as outpatient.   UTI (urinary tract infection) Continue antibiotic therapy with IV ceftriaxone  Follow up cultures, cell count and temperature curve.  Advance Care Planning:   Code Status: Full Code   Consults: none   Family Communication: I spoke with patient's daughter at the bedside, we talked in detail about patient's condition, plan of care and prognosis and all questions were addressed.   Severity of Illness: The appropriate patient status for this patient is INPATIENT. Inpatient status is judged to be reasonable and necessary in order to provide the required intensity of service to ensure the patient's safety. The patient's presenting symptoms, physical exam findings, and initial radiographic and laboratory data in the context of their chronic comorbidities is felt to place them at high risk for further clinical deterioration. Furthermore, it is not anticipated that the patient will be medically stable for discharge from the hospital within 2 midnights of admission.   * I certify that at the point of admission it is my clinical judgment that the patient will require inpatient hospital care spanning beyond 2 midnights from the point of admission due to high intensity of service, high risk for  further deterioration and high frequency of surveillance required.*  Author: Albertus Alt, MD 01/08/2024 8:37 PM  For on call review [email or web address].

## 2024-01-08 NOTE — Assessment & Plan Note (Signed)
 Continue antibiotic therapy with IV ceftriaxone  Follow up cultures, cell count and temperature curve.

## 2024-01-08 NOTE — Assessment & Plan Note (Signed)
 Hyponatremia.  Hypovolemic.   Plan to continue hydration with dextrose  and isotonic saline at 100 ml per hr Follow up renal function and electrolytes in am.  Elevated Ck not in rhabdomyolysis range, but will need trend.  Avoid hypotension and nephrotoxic medications.

## 2024-01-08 NOTE — ED Triage Notes (Signed)
 Pt is a resident at [facility] (independent side) , pt was found on the floor, daughter assumes over the night.  Pt had been incont of urine.  C/o left hip, recent right hip surgery.  Daughter states pt does not seem herself.

## 2024-01-08 NOTE — Assessment & Plan Note (Signed)
 Follow up as outpatient.

## 2024-01-09 DIAGNOSIS — N390 Urinary tract infection, site not specified: Secondary | ICD-10-CM | POA: Diagnosis not present

## 2024-01-09 DIAGNOSIS — U071 COVID-19: Secondary | ICD-10-CM | POA: Diagnosis not present

## 2024-01-09 DIAGNOSIS — K219 Gastro-esophageal reflux disease without esophagitis: Secondary | ICD-10-CM | POA: Insufficient documentation

## 2024-01-09 DIAGNOSIS — E876 Hypokalemia: Secondary | ICD-10-CM | POA: Diagnosis not present

## 2024-01-09 DIAGNOSIS — G9341 Metabolic encephalopathy: Secondary | ICD-10-CM | POA: Diagnosis not present

## 2024-01-09 LAB — BASIC METABOLIC PANEL WITH GFR
Anion gap: 7 (ref 5–15)
BUN: 16 mg/dL (ref 8–23)
CO2: 24 mmol/L (ref 22–32)
Calcium: 8.5 mg/dL — ABNORMAL LOW (ref 8.9–10.3)
Chloride: 106 mmol/L (ref 98–111)
Creatinine, Ser: 0.6 mg/dL (ref 0.44–1.00)
GFR, Estimated: >60 mL/min (ref >60)
Glucose, Bld: 124 mg/dL — ABNORMAL HIGH (ref 70–99)
Potassium: 3.4 mmol/L — ABNORMAL LOW (ref 3.5–5.1)
Sodium: 137 mmol/L (ref 135–145)

## 2024-01-09 LAB — CBC
HCT: 38.3 % (ref 36.0–46.0)
Hemoglobin: 13.2 g/dL (ref 12.0–15.0)
MCH: 32.4 pg (ref 26.0–34.0)
MCHC: 34.5 g/dL (ref 30.0–36.0)
MCV: 94.1 fL (ref 80.0–100.0)
Platelets: 207 10*3/uL (ref 150–400)
RBC: 4.07 MIL/uL (ref 3.87–5.11)
RDW: 12.8 % (ref 11.5–15.5)
WBC: 9 10*3/uL (ref 4.0–10.5)
nRBC: 0 % (ref 0.0–0.2)

## 2024-01-09 LAB — CK: Total CK: 176 U/L (ref 38–234)

## 2024-01-09 LAB — MRSA NEXT GEN BY PCR, NASAL: MRSA by PCR Next Gen: NOT DETECTED

## 2024-01-09 LAB — MAGNESIUM: Magnesium: 1.9 mg/dL (ref 1.7–2.4)

## 2024-01-09 MED ORDER — POTASSIUM CHLORIDE CRYS ER 20 MEQ PO TBCR
60.0000 meq | EXTENDED_RELEASE_TABLET | Freq: Once | ORAL | Status: AC
  Administered 2024-01-09: 60 meq via ORAL
  Filled 2024-01-09: qty 3

## 2024-01-09 MED ORDER — ACETAMINOPHEN 325 MG PO TABS
650.0000 mg | ORAL_TABLET | Freq: Four times a day (QID) | ORAL | Status: DC
  Administered 2024-01-09 – 2024-01-10 (×3): 650 mg via ORAL
  Filled 2024-01-09 (×3): qty 2

## 2024-01-09 MED ORDER — NIRMATRELVIR/RITONAVIR (PAXLOVID) TABLET (RENAL DOSING)
2.0000 | ORAL_TABLET | Freq: Two times a day (BID) | ORAL | Status: DC
  Administered 2024-01-09 – 2024-01-10 (×3): 2 via ORAL
  Filled 2024-01-09: qty 20

## 2024-01-09 NOTE — Plan of Care (Signed)
  Problem: Coping: Goal: Psychosocial and spiritual needs will be supported Outcome: Progressing   Problem: Respiratory: Goal: Will maintain a patent airway Outcome: Progressing Goal: Complications related to the disease process, condition or treatment will be avoided or minimized Outcome: Progressing   Problem: Education: Goal: Knowledge of General Education information will improve Description: Including pain rating scale, medication(s)/side effects and non-pharmacologic comfort measures Outcome: Progressing   Problem: Health Behavior/Discharge Planning: Goal: Ability to manage health-related needs will improve Outcome: Progressing   Problem: Clinical Measurements: Goal: Ability to maintain clinical measurements within normal limits will improve Outcome: Progressing Goal: Will remain free from infection Outcome: Progressing Goal: Diagnostic test results will improve Outcome: Progressing Goal: Respiratory complications will improve Outcome: Progressing Goal: Cardiovascular complication will be avoided Outcome: Progressing   Problem: Activity: Goal: Risk for activity intolerance will decrease Outcome: Progressing   Problem: Nutrition: Goal: Adequate nutrition will be maintained Outcome: Progressing   Problem: Coping: Goal: Level of anxiety will decrease Outcome: Progressing   Problem: Elimination: Goal: Will not experience complications related to bowel motility Outcome: Progressing Goal: Will not experience complications related to urinary retention Outcome: Progressing   Problem: Pain Managment: Goal: General experience of comfort will improve and/or be controlled Outcome: Progressing   Problem: Safety: Goal: Ability to remain free from injury will improve Outcome: Progressing   Problem: Skin Integrity: Goal: Risk for impaired skin integrity will decrease Outcome: Progressing

## 2024-01-09 NOTE — Progress Notes (Signed)
 Patient brought up to the floor via stretcher. Daughter at bedside. Patient pulled up in bed. Daughter left. Fall precautions initiated. Bed in low position. Call bell in reach.

## 2024-01-09 NOTE — Progress Notes (Signed)
 Daughter came up to the floor then left.

## 2024-01-09 NOTE — Progress Notes (Signed)
 Patient is disoriented to place, time, and situation. No family at bedside. Unable to complete some questions to admission database.

## 2024-01-09 NOTE — Plan of Care (Signed)
  Problem: Education: Goal: Knowledge of risk factors and measures for prevention of condition will improve Outcome: Progressing   Problem: Respiratory: Goal: Complications related to the disease process, condition or treatment will be avoided or minimized Outcome: Progressing   Problem: Clinical Measurements: Goal: Ability to maintain clinical measurements within normal limits will improve Outcome: Progressing Goal: Diagnostic test results will improve Outcome: Progressing

## 2024-01-09 NOTE — Progress Notes (Signed)
 PROGRESS NOTE  Shelby Chan ZOX:[id number] DOB: 1932/10/24   PCP: Omie Bickers, MD  Patient is from: ILF.  Has cane but does not use  DOA: 01/08/2024 LOS: 1  Chief complaints Chief Complaint  Patient presents with   Fall     Brief Narrative / Interim history: 89 year old F with PMH of osteoarthritis, HLD, GERD and prior right hip fracture presented with altered mental status, cough, congestion, generalized weakness and poor p.o. intake.  She was found to ED after found down on the floor covered with urine.   On further interview, patient reports falling forward into a closet and with her face landing on a pile of clothes and could not get up.  Has a cane that she does not use consistently.  In ED, stable vitals.  Na 134.  K3.4.  Glucose 122.  WBC 12.3.  Otherwise, CBC and CMP without significant finding.  CK2 197.  UA consistent with UTI.  CT head, CXR, left hip and left knee x-ray without significant finding other than arthritic changes.  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  Patient was started on Paxlovid, IV ceftriaxone and IV fluids and admitted.  Subjective: Seen and examined earlier this morning.  No major events overnight or this morning.  Overnight RN noted some confusion and disorientation.  Patient looks well oriented this morning.  Has no complaints.   Objective: Vitals:   01/08/24 1900 01/08/24 2111 01/09/24 0033 01/09/24 0426  BP: 136/71 118/74 119/60 139/60  Pulse: 80 (!) 105 84 98  Resp: 16 16 18 18   Temp:  (!) 97.4 F (36.3 C) 98 F (36.7 C) 99.1 F (37.3 C)  TempSrc:  Oral Oral Oral  SpO2: 97% 95% 97% 92%  Weight:  84.1 kg    Height:        Examination:  GENERAL: No apparent distress.  Nontoxic. HEENT: MMM.  Vision and hearing grossly intact.  NECK: Supple.  No apparent JVD.  RESP:  No IWOB.  Fair aeration bilaterally. CVS:  RRR. Heart sounds normal.  ABD/GI/GU: BS+. Abd soft,  NTND.  MSK/EXT:  Moves extremities. No apparent deformity. No edema.  SKIN: no apparent skin lesion or wound NEURO: Awake, alert and oriented appropriately.  No apparent focal neuro deficit. PSYCH: Calm. Normal affect.   Consultants:  None  Procedures: None  Microbiology summarized: COVID-19 PCR positive Influenza and RSV PCR nonreactive Urine culture pending  Assessment and plan: COVID-19 virus infection-presents with cough, congestion, poor p.o. intake, weakness and altered mental status.  Currently without significant respiratory symptoms.  No oxygen requirement.  No infiltrate on chest x-ray. -Continue Paxlovid -IS    Acute metabolic encephalopathy: Could be due to COVID and UTI.  Seems to have resolved.  Fairly oriented today.  CT head and MRI brain without acute finding. -Treat treatable causes -Reorientation and delirium precaution -Minimize sedating medications  Possible urinary tract infection: Patient denies UTI symptoms but presented with altered mental status.  Had a mild leukocytosis UA concerning for UTI.   - Continue IV ceftriaxone  - Follow urine culture  Fall at home: She reports falling into the closet with a forehead on a pile of clothes when she tried to grab her shoes.  No significant traumatic injury.  CT head and MRI brain without acute finding. - Fall precaution - PT/OT  Hypokalemia - Monitor replenish as appropriate  Osteoarthritis -P.o. Tylenol  every 6 hours while awake   Age-related osteoporosis  without current pathological fracture Follow up as outpatient.   Class I obesity Body mass index is 31.83 kg/m.          DVT prophylaxis:  enoxaparin  (LOVENOX ) injection 40 mg Start: 01/08/24 2200 SCDs Start: 01/08/24 1944  Code Status: Full code Family Communication: None at bedside Level of care: Med-Surg Status is: Inpatient Remains inpatient appropriate because: Encephalopathy, COVID-19 infection and UTI   Final disposition: Likely  back to ILF   55 minutes with more than 50% spent in reviewing records, counseling patient/family and coordinating care.   Sch Meds:  Scheduled Meds:  enoxaparin  (LOVENOX ) injection  40 mg Subcutaneous Q24H   loratadine   10 mg Oral Daily   multivitamin with minerals  1 tablet Oral Daily   nirmatrelvir/ritonavir (renal dosing)  2 tablet Oral BID   pantoprazole  40 mg Oral Daily   Continuous Infusions:  cefTRIAXone (ROCEPHIN)  IV     dextrose  5 % and 0.9 % NaCl 100 mL/hr at 01/09/24 0948   PRN Meds:.acetaminophen  **OR** acetaminophen , ondansetron  **OR** ondansetron  (ZOFRAN ) IV  Antimicrobials: Anti-infectives (From admission, onward)    Start     Dose/Rate Route Frequency Ordered Stop   01/09/24 1900  cefTRIAXone (ROCEPHIN) 1 g in sodium chloride  0.9 % 100 mL IVPB        1 g 200 mL/hr over 30 Minutes Intravenous Every 24 hours 01/08/24 2358     01/09/24 0500  nirmatrelvir/ritonavir (renal dosing) (PAXLOVID) 2 tablet        2 tablet Oral 2 times daily 01/09/24 0009 01/14/24 0959   01/09/24 0045  nirmatrelvir/ritonavir (PAXLOVID) 3 tablet  Status:  Discontinued        3 tablet Oral 2 times daily 01/08/24 2358 01/09/24 0008   01/08/24 1915  cefTRIAXone (ROCEPHIN) 1 g in sodium chloride  0.9 % 100 mL IVPB        1 g 200 mL/hr over 30 Minutes Intravenous  Once 01/08/24 1906 01/08/24 1954        I have personally reviewed the following labs and images: CBC: Recent Labs  Lab 01/08/24 1329 01/09/24 0411  WBC 12.3* 9.0  NEUTROABS 9.5*  --   HGB 14.7 13.2  HCT 43.9 38.3  MCV 93.0 94.1  PLT 223 207   BMP &GFR Recent Labs  Lab 01/08/24 1329 01/09/24 0411 01/09/24 0744  NA 134* 137  --   K 3.4* 3.4*  --   CL 98 106  --   CO2 24 24  --   GLUCOSE 122* 124*  --   BUN 17 16  --   CREATININE 0.69 0.60  --   CALCIUM 9.6 8.5*  --   MG 2.0  --  1.9   Estimated Creatinine Clearance: 51 mL/min (by C-G formula based on SCr of 0.6 mg/dL). Liver & Pancreas: Recent Labs  Lab  01/08/24 1329  AST 31  ALT 25  ALKPHOS 53  BILITOT 0.8  PROT 7.1  ALBUMIN 4.2   No results for input(s): "LIPASE", "AMYLASE" in the last 168 hours. No results for input(s): "AMMONIA" in the last 168 hours. Diabetic: No results for input(s): "HGBA1C" in the last 72 hours. No results for input(s): "GLUCAP" in the last 168 hours. Cardiac Enzymes: Recent Labs  Lab 01/08/24 1329 01/09/24 0411  CKTOTAL 297* 176   No results for input(s): "PROBNP" in the last 8760 hours. Coagulation Profile: No results for input(s): "INR", "PROTIME" in the last 168 hours. Thyroid  Function Tests: No results for input(s): "TSH", "  T4TOTAL", "FREET4", "T3FREE", "THYROIDAB" in the last 72 hours. Lipid Profile: No results for input(s): "CHOL", "HDL", "LDLCALC", "TRIG", "CHOLHDL", "LDLDIRECT" in the last 72 hours. Anemia Panel: No results for input(s): "VITAMINB12", "FOLATE", "FERRITIN", "TIBC", "IRON", "RETICCTPCT" in the last 72 hours. Urine analysis:    Component Value Date/Time   COLORURINE YELLOW 01/08/2024 1319   APPEARANCEUR CLOUDY (A) 01/08/2024 1319   LABSPEC 1.018 01/08/2024 1319   PHURINE 6.0 01/08/2024 1319   GLUCOSEU NEGATIVE 01/08/2024 1319   HGBUR SMALL (A) 01/08/2024 1319   BILIRUBINUR NEGATIVE 01/08/2024 1319   KETONESUR 5 (A) 01/08/2024 1319   PROTEINUR 30 (A) 01/08/2024 1319   NITRITE POSITIVE (A) 01/08/2024 1319   LEUKOCYTESUR NEGATIVE 01/08/2024 1319   Sepsis Labs: Invalid input(s): "PROCALCITONIN", "LACTICIDVEN"  Microbiology: Recent Results (from the past 240 hours)  Resp panel by RT-PCR (RSV, Flu A&B, Covid) Anterior Nasal Swab     Status: Abnormal   Collection Time: 01/08/24  5:42 PM   Specimen: Anterior Nasal Swab  Result Value Ref Range Status   SARS Coronavirus 2 by RT PCR POSITIVE (A) NEGATIVE Final    Comment: (NOTE) SARS-CoV-2 target nucleic acids are DETECTED.  The SARS-CoV-2 RNA is generally detectable in upper respiratory specimens during the acute phase  of infection. Positive results are indicative of the presence of the identified virus, but do not rule out bacterial infection or co-infection with other pathogens not detected by the test. Clinical correlation with patient history and other diagnostic information is necessary to determine patient infection status. The expected result is Negative.  Fact Sheet for Patients: BloggerCourse.com  Fact Sheet for Healthcare Providers: SeriousBroker.it  This test is not yet approved or cleared by the [place]  FDA and  has been authorized for detection and/or diagnosis of SARS-CoV-2 by FDA under an Emergency Use Authorization (EUA).  This EUA will remain in effect (meaning this test can be used) for the duration of  the COVID-19 declaration under Section 564(b)(1) of the A ct, 21 U.S.C. section 360bbb-3(b)(1), unless the authorization is terminated or revoked sooner.     Influenza A by PCR NEGATIVE NEGATIVE Final   Influenza B by PCR NEGATIVE NEGATIVE Final    Comment: (NOTE) The Xpert Xpress SARS-CoV-2/FLU/RSV plus assay is intended as an aid in the diagnosis of influenza from Nasopharyngeal swab specimens and should not be used as a sole basis for treatment. Nasal washings and aspirates are unacceptable for Xpert Xpress SARS-CoV-2/FLU/RSV testing.  Fact Sheet for Patients: BloggerCourse.com  Fact Sheet for Healthcare Providers: SeriousBroker.it  This test is not yet approved or cleared by the [place]  FDA and has been authorized for detection and/or diagnosis of SARS-CoV-2 by FDA under an Emergency Use Authorization (EUA). This EUA will remain in effect (meaning this test can be used) for the duration of the COVID-19 declaration under Section 564(b)(1) of the Act, 21 U.S.C. section 360bbb-3(b)(1), unless the authorization is terminated or revoked.     Resp Syncytial Virus  by PCR NEGATIVE NEGATIVE Final    Comment: (NOTE) Fact Sheet for Patients: BloggerCourse.com  Fact Sheet for Healthcare Providers: SeriousBroker.it  This test is not yet approved or cleared by the [place]  FDA and has been authorized for detection and/or diagnosis of SARS-CoV-2 by FDA under an Emergency Use Authorization (EUA). This EUA will remain in effect (meaning this test can be used) for the duration of the COVID-19 declaration under Section 564(b)(1) of the Act, 21 U.S.C. section 360bbb-3(b)(1), unless the authorization is terminated or revoked.  Performed at [facility], [address] [place], [place] [address]   MRSA Next Gen by PCR, Nasal     Status: None   Collection Time: 01/09/24  5:21 AM   Specimen: Nasal Mucosa; Nasal Swab  Result Value Ref Range Status   MRSA by PCR Next Gen NOT DETECTED NOT DETECTED Final    Comment: (NOTE) The GeneXpert MRSA Assay (FDA approved for NASAL specimens only), is one component of a comprehensive MRSA colonization surveillance program. It is not intended to diagnose MRSA infection nor to guide or monitor treatment for MRSA infections. Test performance is not FDA approved in patients less than 53 years old. Performed at [facility], [address], [place], [place] [address]     Radiology Studies: MR BRAIN WO CONTRAST Result Date: 01/08/2024 CLINICAL DATA:  Mental status change, found down on floor. EXAM: MRI HEAD WITHOUT CONTRAST TECHNIQUE: Multiplanar, multiecho pulse sequences of the brain and surrounding structures were obtained without intravenous contrast. COMPARISON:  CT head earlier same day. FINDINGS: Brain: No acute infarct. Scattered and confluent T2/FLAIR hyperintensity in the periventricular and subcortical white matter suggestive of chronic microvascular ischemic changes. There are numerous foci suggestive of prominent perivascular spaces with possible superimposed  remote lacunar infarcts involving the basal ganglia and corona radiata as well as the left frontal lobe white matter. Remote infarcts noted in the pons and right cerebellar white matter. Susceptibility along multiple sulci of the anterior left frontal lobe suggestive of remote hemorrhage. No mass lesion or midline shift. Posterior fossa is unremarkable. Normal appearance of midline structures. The basilar cisterns are patent. No extra-axial fluid collections. Ventricles: Prominence of the lateral ventricles suggestive of underlying parenchymal volume loss. Vascular: Skull base flow voids are visualized. Skull and upper cervical spine: Degenerative changes in the visualized cervical spine. Visualized calvarium is unremarkable. 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 Sinuses/Orbits: Scattered mucosal thickening throughout the paranasal sinuses most pronounced in the ethmoid sinuses. Bilateral lens replacement. Other: Small right mastoid effusion. IMPRESSION: No acute intracranial abnormality. Moderate chronic microvascular ischemic changes and mild parenchymal volume loss. 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 There are prominent perivascular spaces in the bilateral basal ganglia and corona radiata with likely superimposed component of remote lacunar infarcts. Susceptibility over the anterior left frontal lobe suggestive of remote subarachnoid hemorrhage. Small right mastoid effusion. Electronically Signed   By: Denny Flack M.D.   On: 01/08/2024 16:54   DG Knee Complete 4 Views Right Result Date: 01/08/2024 CLINICAL DATA:  Knee pain after fall. EXAM: RIGHT KNEE - COMPLETE 4+ VIEW COMPARISON:  None Available. FINDINGS: No evidence of acute fracture or dislocation. Two screws traverse the proximal tibia. Moderate tricompartmental peripheral spurring. Multiple ossified intra-articular bodies posteriorly. No significant joint effusion. No focal soft tissue abnormalities.  IMPRESSION: 1. No acute fracture or dislocation. 2. Moderate tricompartmental osteoarthritis with multiple ossified intra-articular bodies. Electronically Signed   By: Chadwick Colonel M.D.   On: 01/08/2024 15:42   DG Knee Complete 4 Views Left Result Date: 01/08/2024 CLINICAL DATA:  Knee pain after fall. EXAM: LEFT KNEE - COMPLETE 4+ VIEW COMPARISON:  None Available. FINDINGS: No acute fracture or dislocation. The joint spaces are preserved. Mild tricompartmental peripheral spurring. No erosion or focal bone abnormality. Trace quadriceps tendon enthesophyte. IMPRESSION: 1. No acute fracture or subluxation of the left knee. 2. Mild tricompartmental osteoarthritis. Electronically Signed   By: Chadwick Colonel M.D.   On: 01/08/2024 15:41   DG Chest 1  View Result Date: 01/08/2024 CLINICAL DATA:  Unwitnessed fall. EXAM: CHEST  1 VIEW COMPARISON:  11/11/2021 FINDINGS: Lung volumes are low. Unchanged heart size. Stable mediastinal contours. Bibasilar atelectasis. No pneumothorax, large pleural effusion or pulmonary edema. On limited assessment, no evidence of acute rib fracture. IMPRESSION: Low lung volumes with bibasilar atelectasis. Electronically Signed   By: Chadwick Colonel M.D.   On: 01/08/2024 15:40   DG Hip Unilat W or Wo Pelvis 2-3 Views Left Result Date: 01/08/2024 CLINICAL DATA:  Hip pain after fall. EXAM: DG HIP (WITH OR WITHOUT PELVIS) 2-3V LEFT COMPARISON:  Pelvis radiograph from right hip series 05/27/2023 FINDINGS: No evidence of acute fracture of the pelvis or left hip. The cortical margins are intact. No hip dislocation, mild left hip degenerative change. Mild enthesopathic change about the greater trochanter. The pubic rami are intact. No pubic symphyseal or sacroiliac diastasis. Screws traverse right femoral neck with healed fracture. IMPRESSION: 1. No acute fracture or subluxation of the left hip. 2. Mild left hip degenerative change. Electronically Signed   By: Chadwick Colonel M.D.   On:  01/08/2024 15:39   CT Head Wo Contrast Result Date: 01/08/2024 CLINICAL DATA:  Found on the floor.  Trauma to the head. EXAM: CT HEAD WITHOUT CONTRAST TECHNIQUE: Contiguous axial images were obtained from the base of the skull through the vertex without intravenous contrast. RADIATION DOSE REDUCTION: This exam was performed according to the departmental dose-optimization program which includes automated exposure control, adjustment of the mA and/or kV according to patient size and/or use of iterative reconstruction technique. COMPARISON:  None Available. FINDINGS: Brain: Age related volume loss. Chronic small-vessel ischemic changes of the white matter. No sign of recent stroke, mass, hemorrhage, hydrocephalus or extra-axial collection. Vascular: There is atherosclerotic calcification of the major vessels at the base of the brain. Skull: Negative Sinuses/Orbits: Clear/normal Other: None IMPRESSION: No acute or traumatic finding. Age related volume loss. Chronic small-vessel ischemic changes of the white matter. Electronically Signed   By: Bettylou Brunner M.D.   On: 01/08/2024 14:22      Natausha Jungwirth T. Joahan Swatzell [facility]  If 7PM-7AM, please contact night-coverage [email or web address] 01/09/2024, 12:07 PM

## 2024-01-10 DIAGNOSIS — N390 Urinary tract infection, site not specified: Secondary | ICD-10-CM | POA: Diagnosis not present

## 2024-01-10 DIAGNOSIS — G9341 Metabolic encephalopathy: Secondary | ICD-10-CM | POA: Diagnosis not present

## 2024-01-10 DIAGNOSIS — U071 COVID-19: Secondary | ICD-10-CM | POA: Diagnosis not present

## 2024-01-10 DIAGNOSIS — E876 Hypokalemia: Secondary | ICD-10-CM | POA: Diagnosis not present

## 2024-01-10 LAB — COMPREHENSIVE METABOLIC PANEL WITH GFR
ALT: 26 U/L (ref 0–44)
AST: 27 U/L (ref 15–41)
Albumin: 3.4 g/dL — ABNORMAL LOW (ref 3.5–5.0)
Alkaline Phosphatase: 45 U/L (ref 38–126)
Anion gap: 5 (ref 5–15)
BUN: 12 mg/dL (ref 8–23)
CO2: 24 mmol/L (ref 22–32)
Calcium: 8.7 mg/dL — ABNORMAL LOW (ref 8.9–10.3)
Chloride: 108 mmol/L (ref 98–111)
Creatinine, Ser: 0.69 mg/dL (ref 0.44–1.00)
GFR, Estimated: >60 mL/min (ref >60)
Glucose, Bld: 101 mg/dL — ABNORMAL HIGH (ref 70–99)
Potassium: 3.8 mmol/L (ref 3.5–5.1)
Sodium: 137 mmol/L (ref 135–145)
Total Bilirubin: 0.5 mg/dL (ref 0.0–1.2)
Total Protein: 5.8 g/dL — ABNORMAL LOW (ref 6.5–8.1)

## 2024-01-10 LAB — CBC
HCT: 40.3 % (ref 36.0–46.0)
Hemoglobin: 13.6 g/dL (ref 12.0–15.0)
MCH: 32.2 pg (ref 26.0–34.0)
MCHC: 33.7 g/dL (ref 30.0–36.0)
MCV: 95.3 fL (ref 80.0–100.0)
Platelets: 207 10*3/uL (ref 150–400)
RBC: 4.23 MIL/uL (ref 3.87–5.11)
RDW: 12.8 % (ref 11.5–15.5)
WBC: 5.2 10*3/uL (ref 4.0–10.5)
nRBC: 0 % (ref 0.0–0.2)

## 2024-01-10 LAB — CK: Total CK: 123 U/L (ref 38–234)

## 2024-01-10 LAB — PHOSPHORUS: Phosphorus: 2.1 mg/dL — ABNORMAL LOW (ref 2.5–4.6)

## 2024-01-10 LAB — MAGNESIUM: Magnesium: 2.1 mg/dL (ref 1.7–2.4)

## 2024-01-10 MED ORDER — CEFADROXIL 500 MG PO CAPS
1000.0000 mg | ORAL_CAPSULE | Freq: Two times a day (BID) | ORAL | 0 refills | Status: AC
Start: 2024-01-10 — End: 2024-01-13

## 2024-01-10 MED ORDER — POTASSIUM PHOSPHATES 15 MMOLE/5ML IV SOLN
15.0000 mmol | Freq: Once | INTRAVENOUS | Status: AC
  Administered 2024-01-10: 15 mmol via INTRAVENOUS
  Filled 2024-01-10: qty 5

## 2024-01-10 MED ORDER — NIRMATRELVIR/RITONAVIR (PAXLOVID) TABLET (RENAL DOSING): 2.0000 | ORAL_TABLET | Freq: Two times a day (BID) | ORAL | Status: AC

## 2024-01-10 NOTE — Evaluation (Signed)
 Physical Therapy Evaluation Patient Details Name: Shelby Chan MRN: [id number] DOB: 1933/08/01 Today's Date: 01/10/2024  History of Present Illness  Shelby Chan is a 89 y.o. female with medical history significant of arthritis, GERD, and dyslipidemia who presented with altered mental status.   Patient has been at her usual state of health until 24 hr prior to her admission. She was noted to have nasal congestion, coughing and generalized weakness. Very poor appetite and poor oral intake. She lives in independent living facility. Patient was seen at dinner last night by her friends and then she returned alone to her bedroom. The next morning she did not show up for breakfast and her friend went to her room looking for her. Her door was unlocked and patient was found on the floor covered with urine. Apparently she spent all night on the floor. Not clear mechanism of fall and is not clear if she had head trauma or syncope.   She was noted to be confused and disorientated, not able to stand back on her feet. Because severity of her symptoms she was brought to the hospital.   Clinical Impression  Patient demonstrates modified independence with bed mobility, functional transfers, and ambulation. Pt able move to the EOB with raised HOB, verbal cues and increased amount of time. Pt demonstrates ability to ambulate in room with independence but demonstrates fear of falling, decreased stride length and velocity, pt is still sore from fall and is very cautious at this time. Patient demonstrates reaching for door way and furniture walks in room but demonstrates ability to ambulate with out. Patient has all recommended equipment at home and good family support for making it to outpatient appointments. Patient to be discharged home today and discharged from acute physical therapy with recommendations for continuing of outpatient physical therapy.           If plan is discharge home, recommend the following:  A little help with walking and/or transfers;A little help with bathing/dressing/bathroom;Help with stairs or ramp for entrance;Assist for transportation;Assistance with cooking/housework   Can travel by private vehicle        Equipment Recommendations None recommended by PT  Recommendations for Other Services       Functional Status Assessment Patient has had a recent decline in their functional status and demonstrates the ability to make significant improvements in function in a reasonable and predictable amount of time.     Precautions / Restrictions Precautions Precautions: Fall Recall of Precautions/Restrictions: Intact Precaution/Restrictions Comments: previous hip fracture Restrictions Weight Bearing Restrictions Per Provider Order: No      Mobility  Bed Mobility Overal bed mobility: Modified Independent                  Transfers Overall transfer level: Modified independent                 General transfer comment: pt does not require AD for bed to commode transfer    Ambulation/Gait Ambulation/Gait assistance: Modified independent (Device/Increase time) Gait Distance (Feet): 30 Feet Assistive device: None Gait Pattern/deviations: Decreased step length - right, Decreased step length - left, Decreased stride length Gait velocity: decreased     General Gait Details: Pt demonstrates decreased quality gait due to soreness of low back and hip. Does not require AD at this time.  Stairs            Wheelchair Mobility     Tilt Bed    Modified Rankin (Stroke Patients Only)  Balance Overall balance assessment: Modified Independent, History of Falls (increased time)                                           Pertinent Vitals/Pain Pain Assessment Pain Assessment: Faces Faces Pain Scale: Hurts a little bit Pain Location: hip and back, post fall soreness Pain Intervention(s): Limited activity within patient's  tolerance, Repositioned    Home Living Family/patient expects to be discharged to:: Assisted living                 Home Equipment: Rollator (4 wheels);Rolling Walker (2 wheels);Cane - single point Additional Comments: independent side of ALF, handicap accessible bathroom    Prior Function Prior Level of Function : Independent/Modified Independent             Mobility Comments: mod independent with home and close community ambulation, family will drive her if destination is outside of [place] ADLs Comments: Pt independent with basic ADLs     Extremity/Trunk Assessment   Upper Extremity Assessment Upper Extremity Assessment: Overall WFL for tasks assessed    Lower Extremity Assessment Lower Extremity Assessment: Overall WFL for tasks assessed    Cervical / Trunk Assessment Cervical / Trunk Assessment: Kyphotic  Communication   Communication Communication: No apparent difficulties    Cognition Arousal: Alert Behavior During Therapy: WFL for tasks assessed/performed   PT - Cognitive impairments: No apparent impairments                         Following commands: Intact       Cueing Cueing Techniques: Verbal cues     General Comments      Exercises     Assessment/Plan    PT Assessment All further PT needs can be met in the next venue of care  PT Problem List Decreased strength;Decreased activity tolerance;Decreased balance;Decreased mobility;Pain       PT Treatment Interventions      PT Goals (Current goals can be found in the Care Plan section)  Acute Rehab PT Goals Patient Stated Goal: to return home and continue outpatient therapy PT Goal Formulation: With patient/family Time For Goal Achievement: 01/10/24 Potential to Achieve Goals: Good    Frequency       Co-evaluation               AM-PAC PT "6 Clicks" Mobility  Outcome Measure Help needed turning from your back to your side while in a flat bed without using  bedrails?: None Help needed moving from lying on your back to sitting on the side of a flat bed without using bedrails?: None Help needed moving to and from a bed to a chair (including a wheelchair)?: None Help needed standing up from a chair using your arms (e.g., wheelchair or bedside chair)?: None Help needed to walk in hospital room?: None Help needed climbing 3-5 steps with a railing? : A Little 6 Click Score: 23    End of Session   Activity Tolerance: Patient tolerated treatment well Patient left: in bed;with family/visitor present Nurse Communication: Mobility status PT Visit Diagnosis: Unsteadiness on feet (R26.81);Other abnormalities of gait and mobility (R26.89);History of falling (Z91.81);Pain    Time: 6962-9528 PT Time Calculation (min) (ACUTE ONLY): 17 min   Charges:   PT Evaluation $PT Eval Low Complexity: 1 Low PT Treatments $Therapeutic Activity: 8-22 mins PT General  Charges $$ ACUTE PT VISIT: 1 Visit         Armond Bertin, PT, DPT [facility] Office: [phone] 11:30 AM, 01/10/24

## 2024-01-10 NOTE — Discharge Summary (Signed)
 Physician Discharge Summary  Shelby Chan ZOX:[id number] DOB: 1933-05-26 DOA: 01/08/2024  PCP: Omie Bickers, MD  Admit date: 01/08/2024 Discharge date: 01/10/24  Admitted From: ILF Disposition: Home with daughter Recommendations for Outpatient Follow-up:  Follow up with PCP in 1 week Recommend formal cognitive evaluation outpatient Check blood pressure, CMP and CBC at follow-up Please follow up on the following pending results: Urine culture speciation and sensitivity  Home Health: Ambulatory referral to physical therapy ordered as recommended by physical therapy Equipment/Devices: No need identified  Discharge Condition: Stable CODE STATUS: Full code  Follow-up Information     Omie Bickers, MD. Schedule an appointment as soon as possible for a visit in 1 week(s).   Specialty: Internal Medicine Contact information: [address] [place] [address] [phone]                 Hospital course 89 year old F with PMH of osteoarthritis, HLD, GERD and prior right hip fracture presented with altered mental status, cough, congestion, generalized weakness and poor p.o. intake.  She was found to ED after found down on the floor covered with urine.    On further interview, patient reports falling forward into a closet and with her face landing on a pile of clothes and could not get up.  Has a cane that she does not use consistently.   In ED, stable vitals.  Na 134.  K3.4.  Glucose 122.  WBC 12.3.  Otherwise, CBC and CMP without significant finding.  CK2 197.  UA consistent with UTI.  CT head, CXR, left hip and left knee x-ray without significant finding other than arthritic changes.  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  Patient was started on Paxlovid, IV ceftriaxone and IV fluids and admitted.  The next day, patient felt better.  She denied new UTI symptoms including dysuria, frequency or  urgency.  She is on Gemtesa for urinary incontinence.  On the day of discharge, continued to feel well.  Urine culture with GNR.  After discussion with patient and patient's daughter, she is discharged on p.o. cefadroxil and Paxlovid for 3 more days.  Urine culture speciation and sensitivity pending at time of discharge.  See individual problem list below for more.   Problems addressed during this hospitalization COVID-19 virus infection-presents with cough, congestion, poor p.o. intake, weakness and altered mental status.  Currently without significant respiratory symptoms other than mild cough.  No oxygen requirement.  No infiltrate on chest x-ray. -Continue Paxlovid for 3 more days to complete 5 days course -Counseled on the importance of appropriate masking and hand sanitization to prevent spread     Acute metabolic encephalopathy: Could be due to COVID and UTI.  Seems to have resolved.  Fairly oriented today.  CT head and MRI brain without acute finding. -Recommend formal cognitive evaluation outpatient   Possible urinary tract infection: Patient denies UTI symptoms but presented with altered mental status.  Had a mild leukocytosis UA concerning for UTI.  Urine culture with GNR. - Given IV ceftriaxone for 2 days.  Discharged on p.o. cefadroxil for 3 more days. - Follow urine culture speciation and sensitivity   Fall at home: She reports falling into the closet with a forehead on a pile of clothes when she tried to grab her shoes.  No significant traumatic injury.  CT head and MRI brain without acute finding. - Ambulatory referral to physical therapy ordered as recommended by  therapy   Hypokalemia: Resolved   Osteoarthritis -P.o. Tylenol  every 6 hours while awake   Age-related osteoporosis without current pathological fracture -Follow up as outpatient.    Class I obesity Body mass index is 31.83 kg/m.           Time spent 35 minutes  Vital signs Vitals:   01/09/24 0033  01/09/24 0426 01/09/24 1956 01/10/24 0505  BP: 119/60 139/60 (!) 140/68 (!) 165/70  Pulse: 84 98 80 81  Temp: 98 F (36.7 C) 99.1 F (37.3 C) 98 F (36.7 C) 98 F (36.7 C)  Resp: 18 18 18 18   Height:      Weight:      SpO2: 97% 92% 96% 94%  TempSrc: Oral Oral Oral Oral  BMI (Calculated):         Discharge exam  GENERAL: No apparent distress.  Nontoxic. HEENT: MMM.  Vision and hearing grossly intact.  NECK: Supple.  No apparent JVD.  RESP:  No IWOB.  Fair aeration bilaterally. CVS:  RRR. Heart sounds normal.  ABD/GI/GU: BS+. Abd soft, NTND.  MSK/EXT:  Moves extremities. No apparent deformity. No edema.  SKIN: no apparent skin lesion or wound NEURO: Awake and alert. Oriented appropriately.  No apparent focal neuro deficit. PSYCH: Calm. Normal affect.   Discharge Instructions Discharge Instructions     Diet general   Complete by: As directed    Discharge instructions   Complete by: As directed    It has been a pleasure taking care of you!  You were hospitalized due to confusion, fall, COVID-19 infection and possible urinary tract infection. You have not sustained any serious injury from fall.  You have been started on Paxlovid for COVID-19 infection.  We are discharging you more Paxlovid to complete treatment course.  Have also started you on antibiotics for possible UTI.  Discharging you more antibiotics to complete treatment course.  Please review your new medication list and the directions on your medications before you take them.  Follow-up with your primary care doctor in 1 to 2 weeks. Contain good hydration. Note that COVID-19 infection is very contagious.  We strongly recommend wearing appropriate mask and using appropriate hand sanitizer to prevent spread of COVID-19 infection.  Happy Mother's Day!   Take care,   Increase activity slowly   Complete by: As directed    No wound care   Complete by: As directed       Allergies as of 01/10/2024   No Known  Allergies      Medication List     TAKE these medications    acetaminophen  650 MG CR tablet Commonly known as: TYLENOL  Take 1,300 mg by mouth every 8 (eight) hours as needed for pain.   aspirin  EC 81 MG tablet Take 1 tablet (81 mg total) by mouth 2 (two) times daily. Continue taking once a day after a month What changed: when to take this   CALTRATE 600+D PO Take 1 tablet by mouth daily.   cefadroxil 500 MG capsule Commonly known as: DURICEF Take 2 capsules (1,000 mg total) by mouth 2 (two) times daily for 3 days.   cetirizine 10 MG tablet Commonly known as: ZYRTEC Take 10 mg by mouth daily.   Coenzyme Q10 100 MG capsule Take 100 mg by mouth daily.   Gemtesa 75 MG Tabs Generic drug: Vibegron Take 1 tablet by mouth daily.   multivitamin with minerals Tabs tablet Take 1 tablet by mouth daily.   nirmatrelvir/ritonavir (renal dosing) 10  x 150 MG & 10 x 100MG  Tabs Commonly known as: PAXLOVID Take 2 tablets by mouth 2 (two) times daily for 3 days.        Consultations: None  Procedures/Studies:   MR BRAIN WO CONTRAST Result Date: 01/08/2024 CLINICAL DATA:  Mental status change, found down on floor. EXAM: MRI HEAD WITHOUT CONTRAST TECHNIQUE: Multiplanar, multiecho pulse sequences of the brain and surrounding structures were obtained without intravenous contrast. COMPARISON:  CT head earlier same day. FINDINGS: Brain: No acute infarct. Scattered and confluent T2/FLAIR hyperintensity in the periventricular and subcortical white matter suggestive of chronic microvascular ischemic changes. There are numerous foci suggestive of prominent perivascular spaces with possible superimposed remote lacunar infarcts involving the basal ganglia and corona radiata as well as the left frontal lobe white matter. Remote infarcts noted in the pons and right cerebellar white matter. Susceptibility along multiple sulci of the anterior left frontal lobe suggestive of remote hemorrhage. No mass  lesion or midline shift. Posterior fossa is unremarkable. Normal appearance of midline structures. The basilar cisterns are patent. No extra-axial fluid collections. Ventricles: Prominence of the lateral ventricles suggestive of underlying parenchymal volume loss. Vascular: Skull base flow voids are visualized. Skull and upper cervical spine: Degenerative changes in the visualized cervical spine. Visualized calvarium is unremarkable. 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 Sinuses/Orbits: Scattered mucosal thickening throughout the paranasal sinuses most pronounced in the ethmoid sinuses. Bilateral lens replacement. Other: Small right mastoid effusion. IMPRESSION: No acute intracranial abnormality. Moderate chronic microvascular ischemic changes and mild parenchymal volume loss. 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 There are prominent perivascular spaces in the bilateral basal ganglia and corona radiata with likely superimposed component of remote lacunar infarcts. Susceptibility over the anterior left frontal lobe suggestive of remote subarachnoid hemorrhage. Small right mastoid effusion. Electronically Signed   By: Denny Flack M.D.   On: 01/08/2024 16:54   DG Knee Complete 4 Views Right Result Date: 01/08/2024 CLINICAL DATA:  Knee pain after fall. EXAM: RIGHT KNEE - COMPLETE 4+ VIEW COMPARISON:  None Available. FINDINGS: No evidence of acute fracture or dislocation. Two screws traverse the proximal tibia. Moderate tricompartmental peripheral spurring. Multiple ossified intra-articular bodies posteriorly. No significant joint effusion. No focal soft tissue abnormalities. IMPRESSION: 1. No acute fracture or dislocation. 2. Moderate tricompartmental osteoarthritis with multiple ossified intra-articular bodies. Electronically Signed   By: Chadwick Colonel M.D.   On: 01/08/2024 15:42   DG Knee Complete 4 Views Left Result Date: 01/08/2024 CLINICAL DATA:  Knee pain  after fall. EXAM: LEFT KNEE - COMPLETE 4+ VIEW COMPARISON:  None Available. FINDINGS: No acute fracture or dislocation. The joint spaces are preserved. Mild tricompartmental peripheral spurring. No erosion or focal bone abnormality. Trace quadriceps tendon enthesophyte. IMPRESSION: 1. No acute fracture or subluxation of the left knee. 2. Mild tricompartmental osteoarthritis. Electronically Signed   By: Chadwick Colonel M.D.   On: 01/08/2024 15:41   DG Chest 1 View Result Date: 01/08/2024 CLINICAL DATA:  Unwitnessed fall. EXAM: CHEST  1 VIEW COMPARISON:  11/11/2021 FINDINGS: Lung volumes are low. Unchanged heart size. Stable mediastinal contours. Bibasilar atelectasis. No pneumothorax, large pleural effusion or pulmonary edema. On limited assessment, no evidence of acute rib fracture. IMPRESSION: Low lung volumes with bibasilar atelectasis. Electronically Signed   By: Chadwick Colonel M.D.   On: 01/08/2024 15:40   DG Hip Unilat W or Wo Pelvis 2-3 Views Left Result Date: 01/08/2024 CLINICAL DATA:  Hip pain after fall. EXAM: DG HIP (  WITH OR WITHOUT PELVIS) 2-3V LEFT COMPARISON:  Pelvis radiograph from right hip series 05/27/2023 FINDINGS: No evidence of acute fracture of the pelvis or left hip. The cortical margins are intact. No hip dislocation, mild left hip degenerative change. Mild enthesopathic change about the greater trochanter. The pubic rami are intact. No pubic symphyseal or sacroiliac diastasis. Screws traverse right femoral neck with healed fracture. IMPRESSION: 1. No acute fracture or subluxation of the left hip. 2. Mild left hip degenerative change. Electronically Signed   By: Chadwick Colonel M.D.   On: 01/08/2024 15:39   CT Head Wo Contrast Result Date: 01/08/2024 CLINICAL DATA:  Found on the floor.  Trauma to the head. EXAM: CT HEAD WITHOUT CONTRAST TECHNIQUE: Contiguous axial images were obtained from the base of the skull through the vertex without intravenous contrast. RADIATION DOSE  REDUCTION: This exam was performed according to the departmental dose-optimization program which includes automated exposure control, adjustment of the mA and/or kV according to patient size and/or use of iterative reconstruction technique. COMPARISON:  None Available. FINDINGS: Brain: Age related volume loss. Chronic small-vessel ischemic changes of the white matter. No sign of recent stroke, mass, hemorrhage, hydrocephalus or extra-axial collection. Vascular: There is atherosclerotic calcification of the major vessels at the base of the brain. Skull: Negative Sinuses/Orbits: Clear/normal Other: None IMPRESSION: No acute or traumatic finding. Age related volume loss. Chronic small-vessel ischemic changes of the white matter. Electronically Signed   By: Bettylou Brunner M.D.   On: 01/08/2024 14:22       The results of significant diagnostics from this hospitalization (including imaging, microbiology, ancillary and laboratory) are listed below for reference.     Microbiology: Recent Results (from the past 240 hours)  Urine Culture (for pregnant, neutropenic or urologic patients or patients with an indwelling urinary catheter)     Status: Abnormal (Preliminary result)   Collection Time: 01/08/24  1:19 PM   Specimen: Urine, Clean Catch  Result Value Ref Range Status   Specimen Description   Final    URINE, CLEAN CATCH Performed at [facility], [address], [place], [place] [address]    Special Requests   Final    NONE Performed at [facility], [address] [place], [place] [address]    Culture (A)  Final    >=100,000 COLONIES/mL GRAM NEGATIVE RODS IDENTIFICATION TO FOLLOW Performed at [facility], [address], [place], [place] [address]    Report Status PENDING  Incomplete  Resp panel by RT-PCR (RSV, Flu A&B, Covid) Anterior Nasal Swab     Status: Abnormal   Collection Time: 01/08/24  5:42 PM   Specimen: Anterior Nasal Swab  Result Value Ref Range Status   SARS Coronavirus 2  by RT PCR POSITIVE (A) NEGATIVE Final    Comment: (NOTE) SARS-CoV-2 target nucleic acids are DETECTED.  The SARS-CoV-2 RNA is generally detectable in upper respiratory specimens during the acute phase of infection. Positive results are indicative of the presence of the identified virus, but do not rule out bacterial infection or co-infection with other pathogens not detected by the test. Clinical correlation with patient history and other diagnostic information is necessary to determine patient infection status. The expected result is Negative.  Fact Sheet for Patients: BloggerCourse.com  Fact Sheet for Healthcare Providers: SeriousBroker.it  This test is not yet approved or cleared by the [place]  FDA and  has been authorized for detection and/or diagnosis of SARS-CoV-2 by FDA under an Emergency Use Authorization (EUA).  This EUA will remain in effect (meaning this test can be used) for the duration of  the COVID-19 declaration under Section 564(b)(1) of the A ct, 21 U.S.C. section 360bbb-3(b)(1), unless the authorization is terminated or revoked sooner.     Influenza A by PCR NEGATIVE NEGATIVE Final   Influenza B by PCR NEGATIVE NEGATIVE Final    Comment: (NOTE) The Xpert Xpress SARS-CoV-2/FLU/RSV plus assay is intended as an aid in the diagnosis of influenza from Nasopharyngeal swab specimens and should not be used as a sole basis for treatment. Nasal washings and aspirates are unacceptable for Xpert Xpress SARS-CoV-2/FLU/RSV testing.  Fact Sheet for Patients: BloggerCourse.com  Fact Sheet for Healthcare Providers: SeriousBroker.it  This test is not yet approved or cleared by the [place]  FDA and has been authorized for detection and/or diagnosis of SARS-CoV-2 by FDA under an Emergency Use Authorization (EUA). This EUA will remain in effect (meaning this test can  be used) for the duration of the COVID-19 declaration under Section 564(b)(1) of the Act, 21 U.S.C. section 360bbb-3(b)(1), unless the authorization is terminated or revoked.     Resp Syncytial Virus by PCR NEGATIVE NEGATIVE Final    Comment: (NOTE) Fact Sheet for Patients: BloggerCourse.com  Fact Sheet for Healthcare Providers: SeriousBroker.it  This test is not yet approved or cleared by the [place]  FDA and has been authorized for detection and/or diagnosis of SARS-CoV-2 by FDA under an Emergency Use Authorization (EUA). This EUA will remain in effect (meaning this test can be used) for the duration of the COVID-19 declaration under Section 564(b)(1) of the Act, 21 U.S.C. section 360bbb-3(b)(1), unless the authorization is terminated or revoked.  Performed at [facility], [address], [place], [place] [address]   MRSA Next Gen by PCR, Nasal     Status: None   Collection Time: 01/09/24  5:21 AM   Specimen: Nasal Mucosa; Nasal Swab  Result Value Ref Range Status   MRSA by PCR Next Gen NOT DETECTED NOT DETECTED Final    Comment: (NOTE) The GeneXpert MRSA Assay (FDA approved for NASAL specimens only), is one component of a comprehensive MRSA colonization surveillance program. It is not intended to diagnose MRSA infection nor to guide or monitor treatment for MRSA infections. Test performance is not FDA approved in patients less than 34 years old. Performed at [facility], [address] [place], [place] [address]      Labs:  CBC: Recent Labs  Lab 01/08/24 1329 01/09/24 0411 01/10/24 0525  WBC 12.3* 9.0 5.2  NEUTROABS 9.5*  --   --   HGB 14.7 13.2 13.6  HCT 43.9 38.3 40.3  MCV 93.0 94.1 95.3  PLT 223 207 207   BMP &GFR Recent Labs  Lab 01/08/24 1329 01/09/24 0411 01/09/24 0744 01/10/24 0525  NA 134* 137  --  137  K 3.4* 3.4*  --  3.8  CL 98 106  --  108  CO2 24 24  --  24  GLUCOSE 122* 124*  --   101*  BUN 17 16  --  12  CREATININE 0.69 0.60  --  0.69  CALCIUM 9.6 8.5*  --  8.7*  MG 2.0  --  1.9 2.1  PHOS  --   --   --  2.1*   Estimated Creatinine Clearance: 51 mL/min (by C-G formula based on SCr of 0.69 mg/dL). Liver & Pancreas: Recent Labs  Lab 01/08/24 1329 01/10/24 0525  AST 31 27  ALT 25 26  ALKPHOS  53 45  BILITOT 0.8 0.5  PROT 7.1 5.8*  ALBUMIN 4.2 3.4*   No results for input(s): "LIPASE", "AMYLASE" in the last 168 hours. No results for input(s): "AMMONIA" in the last 168 hours. Diabetic: No results for input(s): "HGBA1C" in the last 72 hours. No results for input(s): "GLUCAP" in the last 168 hours. Cardiac Enzymes: Recent Labs  Lab 01/08/24 1329 01/09/24 0411 01/10/24 0525  CKTOTAL 297* 176 123   No results for input(s): "PROBNP" in the last 8760 hours. Coagulation Profile: No results for input(s): "INR", "PROTIME" in the last 168 hours. Thyroid  Function Tests: No results for input(s): "TSH", "T4TOTAL", "FREET4", "T3FREE", "THYROIDAB" in the last 72 hours. Lipid Profile: No results for input(s): "CHOL", "HDL", "LDLCALC", "TRIG", "CHOLHDL", "LDLDIRECT" in the last 72 hours. Anemia Panel: No results for input(s): "VITAMINB12", "FOLATE", "FERRITIN", "TIBC", "IRON", "RETICCTPCT" in the last 72 hours. Urine analysis:    Component Value Date/Time   COLORURINE YELLOW 01/08/2024 1319   APPEARANCEUR CLOUDY (A) 01/08/2024 1319   LABSPEC 1.018 01/08/2024 1319   PHURINE 6.0 01/08/2024 1319   GLUCOSEU NEGATIVE 01/08/2024 1319   HGBUR SMALL (A) 01/08/2024 1319   BILIRUBINUR NEGATIVE 01/08/2024 1319   KETONESUR 5 (A) 01/08/2024 1319   PROTEINUR 30 (A) 01/08/2024 1319   NITRITE POSITIVE (A) 01/08/2024 1319   LEUKOCYTESUR NEGATIVE 01/08/2024 1319   Sepsis Labs: Invalid input(s): "PROCALCITONIN", "LACTICIDVEN"   SIGNED:  Theadore Finger, MD  [facility] 01/10/2024, 1:50 PM

## 2024-01-11 LAB — URINE CULTURE: Culture: >=100000 — AB

## 2024-01-12 DIAGNOSIS — Z9181 History of falling: Secondary | ICD-10-CM | POA: Diagnosis not present

## 2024-01-12 DIAGNOSIS — N3281 Overactive bladder: Secondary | ICD-10-CM | POA: Diagnosis not present

## 2024-01-12 DIAGNOSIS — G9340 Encephalopathy, unspecified: Secondary | ICD-10-CM | POA: Diagnosis not present

## 2024-01-12 DIAGNOSIS — M8580 Other specified disorders of bone density and structure, unspecified site: Secondary | ICD-10-CM | POA: Diagnosis not present

## 2024-01-12 DIAGNOSIS — N390 Urinary tract infection, site not specified: Secondary | ICD-10-CM | POA: Diagnosis not present

## 2024-01-12 DIAGNOSIS — U071 COVID-19: Secondary | ICD-10-CM | POA: Diagnosis not present

## 2024-01-13 ENCOUNTER — Telehealth: Payer: Self-pay | Admitting: Student

## 2024-01-13 DIAGNOSIS — M67959 Unspecified disorder of synovium and tendon, unspecified thigh: Secondary | ICD-10-CM | POA: Diagnosis not present

## 2024-01-13 DIAGNOSIS — M6281 Muscle weakness (generalized): Secondary | ICD-10-CM | POA: Diagnosis not present

## 2024-01-13 DIAGNOSIS — R269 Unspecified abnormalities of gait and mobility: Secondary | ICD-10-CM | POA: Diagnosis not present

## 2024-01-13 NOTE — Telephone Encounter (Signed)
 Notified by Dr. Fausto Hooker that patient's daughter is expecting a call about her mother's urine culture result.  Patient was discharged home with her daughter 3 days ago on p.o. cefadroxil.  Urine culture sensitivity was pending at time of discharge.   Patient's urine culture with E. coli resistant to ampicillin, Cipro and Macrobid, and Aerococcus species.  I was able to get hold of patient's daughter on the second attempt.  Per daughter, patient is doing well and more coherent.  She has already seen a PCP for hospital follow-up.  Also started outpatient therapy.  I have updated her about urine culture result and sensitivity.  I told her that her mother was discharged on appropriate antibiotics.  She appreciated the call.  She has not had further question or concern.

## 2024-01-18 DIAGNOSIS — M67959 Unspecified disorder of synovium and tendon, unspecified thigh: Secondary | ICD-10-CM | POA: Diagnosis not present

## 2024-01-18 DIAGNOSIS — M6281 Muscle weakness (generalized): Secondary | ICD-10-CM | POA: Diagnosis not present

## 2024-01-18 DIAGNOSIS — R269 Unspecified abnormalities of gait and mobility: Secondary | ICD-10-CM | POA: Diagnosis not present

## 2024-01-27 DIAGNOSIS — M67959 Unspecified disorder of synovium and tendon, unspecified thigh: Secondary | ICD-10-CM | POA: Diagnosis not present

## 2024-01-27 DIAGNOSIS — M6281 Muscle weakness (generalized): Secondary | ICD-10-CM | POA: Diagnosis not present

## 2024-01-27 DIAGNOSIS — R269 Unspecified abnormalities of gait and mobility: Secondary | ICD-10-CM | POA: Diagnosis not present

## 2024-02-01 ENCOUNTER — Emergency Department (HOSPITAL_BASED_OUTPATIENT_CLINIC_OR_DEPARTMENT_OTHER)

## 2024-02-01 ENCOUNTER — Observation Stay (HOSPITAL_COMMUNITY)

## 2024-02-01 ENCOUNTER — Other Ambulatory Visit: Payer: Self-pay

## 2024-02-01 ENCOUNTER — Encounter (HOSPITAL_BASED_OUTPATIENT_CLINIC_OR_DEPARTMENT_OTHER): Payer: Self-pay

## 2024-02-01 ENCOUNTER — Observation Stay (HOSPITAL_BASED_OUTPATIENT_CLINIC_OR_DEPARTMENT_OTHER)
Admission: EM | Admit: 2024-02-01 | Discharge: 2024-02-03 | Disposition: A | Discharge status: Home Health | Attending: Internal Medicine | Admitting: Internal Medicine

## 2024-02-01 DIAGNOSIS — Z7901 Long term (current) use of anticoagulants: Secondary | ICD-10-CM | POA: Diagnosis not present

## 2024-02-01 DIAGNOSIS — R109 Unspecified abdominal pain: Secondary | ICD-10-CM | POA: Diagnosis not present

## 2024-02-01 DIAGNOSIS — R531 Weakness: Secondary | ICD-10-CM | POA: Insufficient documentation

## 2024-02-01 DIAGNOSIS — M6281 Muscle weakness (generalized): Secondary | ICD-10-CM | POA: Insufficient documentation

## 2024-02-01 DIAGNOSIS — R9431 Abnormal electrocardiogram [ECG] [EKG]: Secondary | ICD-10-CM | POA: Diagnosis not present

## 2024-02-01 DIAGNOSIS — N390 Urinary tract infection, site not specified: Secondary | ICD-10-CM | POA: Diagnosis not present

## 2024-02-01 DIAGNOSIS — M179 Osteoarthritis of knee, unspecified: Secondary | ICD-10-CM | POA: Diagnosis not present

## 2024-02-01 DIAGNOSIS — J9811 Atelectasis: Secondary | ICD-10-CM | POA: Diagnosis not present

## 2024-02-01 DIAGNOSIS — R059 Cough, unspecified: Secondary | ICD-10-CM | POA: Diagnosis not present

## 2024-02-01 DIAGNOSIS — Z9181 History of falling: Secondary | ICD-10-CM | POA: Insufficient documentation

## 2024-02-01 DIAGNOSIS — I959 Hypotension, unspecified: Secondary | ICD-10-CM | POA: Diagnosis not present

## 2024-02-01 DIAGNOSIS — D259 Leiomyoma of uterus, unspecified: Secondary | ICD-10-CM | POA: Diagnosis not present

## 2024-02-01 DIAGNOSIS — Z743 Need for continuous supervision: Secondary | ICD-10-CM | POA: Diagnosis not present

## 2024-02-01 DIAGNOSIS — R509 Fever, unspecified: Secondary | ICD-10-CM | POA: Diagnosis not present

## 2024-02-01 DIAGNOSIS — Z7982 Long term (current) use of aspirin: Secondary | ICD-10-CM | POA: Diagnosis not present

## 2024-02-01 DIAGNOSIS — R319 Hematuria, unspecified: Secondary | ICD-10-CM | POA: Diagnosis present

## 2024-02-01 DIAGNOSIS — K449 Diaphragmatic hernia without obstruction or gangrene: Secondary | ICD-10-CM | POA: Diagnosis not present

## 2024-02-01 DIAGNOSIS — R0989 Other specified symptoms and signs involving the circulatory and respiratory systems: Secondary | ICD-10-CM | POA: Diagnosis not present

## 2024-02-01 LAB — COMPREHENSIVE METABOLIC PANEL WITH GFR
ALT: 29 U/L (ref 0–44)
AST: 30 U/L (ref 15–41)
Albumin: 4.1 g/dL (ref 3.5–5.0)
Alkaline Phosphatase: 75 U/L (ref 38–126)
Anion gap: 14 (ref 5–15)
BUN: 14 mg/dL (ref 8–23)
CO2: 22 mmol/L (ref 22–32)
Calcium: 9.3 mg/dL (ref 8.9–10.3)
Chloride: 99 mmol/L (ref 98–111)
Creatinine, Ser: 0.92 mg/dL (ref 0.44–1.00)
GFR, Estimated: 59 mL/min — ABNORMAL LOW (ref >60)
Glucose, Bld: 129 mg/dL — ABNORMAL HIGH (ref 70–99)
Potassium: 3.7 mmol/L (ref 3.5–5.1)
Sodium: 135 mmol/L (ref 135–145)
Total Bilirubin: 0.6 mg/dL (ref 0.0–1.2)
Total Protein: 6.9 g/dL (ref 6.5–8.1)

## 2024-02-01 LAB — CBC WITH DIFFERENTIAL/PLATELET
Abs Immature Granulocytes: 0.04 10*3/uL (ref 0.00–0.07)
Basophils Absolute: 0 10*3/uL (ref 0.0–0.1)
Basophils Relative: 0 %
Eosinophils Absolute: 0 10*3/uL (ref 0.0–0.5)
Eosinophils Relative: 0 %
HCT: 40.4 % (ref 36.0–46.0)
Hemoglobin: 13.7 g/dL (ref 12.0–15.0)
Immature Granulocytes: 0 %
Lymphocytes Relative: 18 %
Lymphs Abs: 1.6 10*3/uL (ref 0.7–4.0)
MCH: 31.4 pg (ref 26.0–34.0)
MCHC: 33.9 g/dL (ref 30.0–36.0)
MCV: 92.4 fL (ref 80.0–100.0)
Monocytes Absolute: 1 10*3/uL (ref 0.1–1.0)
Monocytes Relative: 11 %
Neutro Abs: 6.6 10*3/uL (ref 1.7–7.7)
Neutrophils Relative %: 71 %
Platelets: 228 10*3/uL (ref 150–400)
RBC: 4.37 MIL/uL (ref 3.87–5.11)
RDW: 12.4 % (ref 11.5–15.5)
WBC: 9.3 10*3/uL (ref 4.0–10.5)
nRBC: 0 % (ref 0.0–0.2)

## 2024-02-01 LAB — URINALYSIS, W/ REFLEX TO CULTURE (INFECTION SUSPECTED)
Bilirubin Urine: NEGATIVE
Glucose, UA: NEGATIVE mg/dL
Nitrite: POSITIVE — AB
Protein, ur: 30 mg/dL — AB
Specific Gravity, Urine: 1.01 (ref 1.005–1.030)
WBC, UA: >50 WBC/hpf (ref 0–5)
pH: 6.5 (ref 5.0–8.0)

## 2024-02-01 LAB — LACTIC ACID, PLASMA: Lactic Acid, Venous: 1.3 mmol/L (ref 0.5–1.9)

## 2024-02-01 LAB — CREATININE, SERUM
Creatinine, Ser: 0.71 mg/dL (ref 0.44–1.00)
GFR, Estimated: >60 mL/min (ref >60)

## 2024-02-01 LAB — CBC
HCT: 41.1 % (ref 36.0–46.0)
Hemoglobin: 13.1 g/dL (ref 12.0–15.0)
MCH: 31.3 pg (ref 26.0–34.0)
MCHC: 31.9 g/dL (ref 30.0–36.0)
MCV: 98.1 fL (ref 80.0–100.0)
Platelets: 210 10*3/uL (ref 150–400)
RBC: 4.19 MIL/uL (ref 3.87–5.11)
RDW: 12.4 % (ref 11.5–15.5)
WBC: 9 10*3/uL (ref 4.0–10.5)
nRBC: 0 % (ref 0.0–0.2)

## 2024-02-01 LAB — TSH: TSH: 2.026 u[IU]/mL (ref 0.350–4.500)

## 2024-02-01 LAB — PROTIME-INR
INR: 1.2 (ref 0.8–1.2)
Prothrombin Time: 14.9 s (ref 11.4–15.2)

## 2024-02-01 MED ORDER — SODIUM CHLORIDE 0.9 % IV SOLN: INTRAVENOUS | Status: DC

## 2024-02-01 MED ORDER — SODIUM CHLORIDE 0.9 % IV BOLUS (SEPSIS)
1000.0000 mL | Freq: Once | INTRAVENOUS | Status: AC
  Administered 2024-02-01: 1000 mL via INTRAVENOUS

## 2024-02-01 MED ORDER — ACETAMINOPHEN 650 MG RE SUPP
650.0000 mg | Freq: Four times a day (QID) | RECTAL | Status: DC | PRN
Start: 2024-02-01 — End: 2024-02-03

## 2024-02-01 MED ORDER — BISACODYL 5 MG PO TBEC: 5.0000 mg | DELAYED_RELEASE_TABLET | Freq: Every day | ORAL | Status: DC | PRN

## 2024-02-01 MED ORDER — ONDANSETRON HCL 4 MG PO TABS: 4.0000 mg | ORAL_TABLET | Freq: Four times a day (QID) | ORAL | Status: DC | PRN

## 2024-02-01 MED ORDER — NICOTINE 21 MG/24HR TD PT24: 21.0000 mg | MEDICATED_PATCH | Freq: Every day | TRANSDERMAL | Status: DC | PRN

## 2024-02-01 MED ORDER — HYDRALAZINE HCL 20 MG/ML IJ SOLN: 10.0000 mg | INTRAMUSCULAR | Status: DC | PRN

## 2024-02-01 MED ORDER — ONDANSETRON HCL 4 MG/2ML IJ SOLN
4.0000 mg | Freq: Four times a day (QID) | INTRAMUSCULAR | Status: DC | PRN
Start: 2024-02-01 — End: 2024-02-03

## 2024-02-01 MED ORDER — ENOXAPARIN SODIUM 40 MG/0.4ML IJ SOSY
40.0000 mg | PREFILLED_SYRINGE | INTRAMUSCULAR | Status: DC
  Administered 2024-02-01 – 2024-02-02 (×2): 40 mg via SUBCUTANEOUS
  Filled 2024-02-01 (×2): qty 0.4

## 2024-02-01 MED ORDER — SENNOSIDES-DOCUSATE SODIUM 8.6-50 MG PO TABS
1.0000 | ORAL_TABLET | Freq: Every evening | ORAL | Status: DC | PRN
Start: 2024-02-01 — End: 2024-02-03

## 2024-02-01 MED ORDER — HYDROMORPHONE HCL 1 MG/ML IJ SOLN: 0.5000 mg | INTRAMUSCULAR | Status: DC | PRN

## 2024-02-01 MED ORDER — OXYCODONE HCL 5 MG PO TABS: 5.0000 mg | ORAL_TABLET | ORAL | Status: DC | PRN

## 2024-02-01 MED ORDER — GUAIFENESIN 100 MG/5ML PO LIQD: 5.0000 mL | ORAL | Status: DC | PRN

## 2024-02-01 MED ORDER — ACETAMINOPHEN 325 MG PO TABS: 650.0000 mg | ORAL_TABLET | Freq: Four times a day (QID) | ORAL | Status: DC | PRN

## 2024-02-01 MED ORDER — METOPROLOL TARTRATE 5 MG/5ML IV SOLN
10.0000 mg | INTRAVENOUS | Status: DC | PRN
Start: 2024-02-01 — End: 2024-02-03

## 2024-02-01 MED ORDER — ASPIRIN 81 MG PO TBEC
81.0000 mg | DELAYED_RELEASE_TABLET | Freq: Every day | ORAL | Status: DC
  Administered 2024-02-02 – 2024-02-03 (×2): 81 mg via ORAL
  Filled 2024-02-01 (×2): qty 1

## 2024-02-01 MED ORDER — IPRATROPIUM-ALBUTEROL 0.5-2.5 (3) MG/3ML IN SOLN: 3.0000 mL | RESPIRATORY_TRACT | Status: DC | PRN

## 2024-02-01 MED ORDER — FLEET ENEMA RE ENEM: 1.0000 | ENEMA | Freq: Once | RECTAL | Status: DC | PRN

## 2024-02-01 MED ORDER — SODIUM CHLORIDE 0.9 % IV SOLN
2.0000 g | Freq: Every day | INTRAVENOUS | Status: DC
  Administered 2024-02-02: 2 g via INTRAVENOUS
  Filled 2024-02-01: qty 20

## 2024-02-01 MED ORDER — ACETAMINOPHEN 325 MG PO TABS
650.0000 mg | ORAL_TABLET | Freq: Once | ORAL | Status: AC
  Administered 2024-02-01: 650 mg via ORAL
  Filled 2024-02-01: qty 2

## 2024-02-01 MED ORDER — SODIUM CHLORIDE 0.9 % IV SOLN
2.0000 g | Freq: Once | INTRAVENOUS | Status: AC
  Administered 2024-02-01: 2 g via INTRAVENOUS
  Filled 2024-02-01: qty 20

## 2024-02-01 MED ORDER — LACTATED RINGERS IV SOLN
INTRAVENOUS | Status: DC
Start: 2024-02-01 — End: 2024-02-01

## 2024-02-01 NOTE — ED Notes (Signed)
 Called [facility] for transport @15 :32.  Spoke with Mariah Shines

## 2024-02-01 NOTE — H&P (Signed)
 History and Physical    Shelby Chan WUJ:[id number] DOB: 11-09-1932 DOA: 02/01/2024  PCP: Maggie Schooner, MD Patient coming from: Home  Chief Complaint: Fever  HPI: Shelby Chan is a 89 y.o. female with medical history significant of osteoarthritis, HLD, GERD comes to the hospital with complaints of fever.  Patient is a poor historian therefore most of the history per daughter at bedside.  Daughter tells me that patient was admitted to the hospital with UTI about a month ago which was treated but then last week she again started feeling very poorly with intermittent fevers and poor oral intake.  During this time she is also become progressively weaker. In the ER she was noted to have urinary tract infection therefore started on Rocephin  and admitted to the hospital for further management.   Review of Systems: As per HPI otherwise 10 point review of systems negative.  Review of Systems Otherwise negative except as per HPI, including: General: Denies  night sweats or unintended weight loss. Resp: Denies cough, wheezing, shortness of breath. Cardiac: Denies chest pain, palpitations, orthopnea, paroxysmal nocturnal dyspnea. GI: Denies abdominal pain, nausea, vomiting, diarrhea or constipation GU: Denies dysuria, frequency, hesitancy or incontinence MS: Denies muscle aches, joint pain or swelling Neuro: Denies headache, neurologic deficits (focal weakness, numbness, tingling), abnormal gait Psych: Denies anxiety, depression, SI/HI/AVH Skin: Denies new rashes or lesions ID: Denies sick contacts, exotic exposures, travel  Past Medical History:  Diagnosis Date   Arthritis    Complication of anesthesia    GERD (gastroesophageal reflux disease)    High cholesterol    PONV (postoperative nausea and vomiting)     Past Surgical History:  Procedure Laterality Date   CHOLECYSTECTOMY     GLUTEUS MINIMUS REPAIR Right 09/24/2023   Procedure: RIGHT GLUTEUS MEDIUS REPAIR WITH COLLAGEN PATCH  AUGMENTATION;  Surgeon: Wilhelmenia Harada, MD;  Location: [facility] OR;  Service: Orthopedics;  Laterality: Right;   HIP PINNING,CANNULATED Right 11/07/2021   Procedure: RIGHT HIP PERCUTANEOUS SCREW FIXATION;  Surgeon: Wilhelmenia Harada, MD;  Location: [facility] OR;  Service: Orthopedics;  Laterality: Right;   Right leg fracture s/p repair with 2 pins placed      SOCIAL HISTORY:  reports that she has never smoked. She has never used smokeless tobacco. She reports that she does not drink alcohol and does not use drugs.  No Known Allergies  FAMILY HISTORY: Family History  Problem Relation Age of Onset   Heart disease Mother      Prior to Admission medications   Medication Sig Start Date End Date Taking? Authorizing Provider  acetaminophen  (TYLENOL ) 650 MG CR tablet Take 1,300 mg by mouth every 8 (eight) hours as needed for pain.    [provider]  aspirin  EC 81 MG tablet Take 1 tablet (81 mg total) by mouth 2 (two) times daily. Continue taking once a day after a month Patient taking differently: Take 81 mg by mouth daily. Continue taking once a day after a month 11/13/21   Leona Rake, MD  Calcium Carbonate-Vitamin D  (CALTRATE 600+D PO) Take 1 tablet by mouth daily.    [provider]  cetirizine (ZYRTEC) 10 MG tablet Take 10 mg by mouth daily.    [provider]  Coenzyme Q10 100 MG capsule Take 100 mg by mouth daily.    [provider]  GEMTESA 75 MG TABS Take 1 tablet by mouth daily. 08/11/23   [provider]  Multiple Vitamin (MULTIVITAMIN WITH MINERALS) TABS tablet Take 1 tablet  by mouth daily.    [provider]    Physical Exam: Vitals:   02/01/24 1430 02/01/24 1445 02/01/24 1544 02/01/24 1642  BP: 129/74  (!) 146/82 (!) 183/91  Pulse: 90 91 93 97  Resp:  20 20 16   Temp:   98.1 F (36.7 C) 98.2 F (36.8 C)  TempSrc:   Oral Oral  SpO2: 96% 100% 96% 100%  Weight:      Height:          Constitutional: NAD, calm,  comfortable Eyes: PERRL, lids and conjunctivae normal ENMT: Mucous membranes are moist. Posterior pharynx clear of any exudate or lesions.Normal dentition.  Neck: normal, supple, no masses, no thyromegaly Respiratory: clear to auscultation bilaterally, no wheezing, no crackles. Normal respiratory effort. No accessory muscle use.  Cardiovascular: Regular rate and rhythm, no murmurs / rubs / gallops. No extremity edema. 2+ pedal pulses. No carotid bruits.  Abdomen: no tenderness, no masses palpated. No hepatosplenomegaly. Bowel sounds positive.  Musculoskeletal: no clubbing / cyanosis. No joint deformity upper and lower extremities. Good ROM, no contractures. Normal muscle tone.  Skin: no rashes, lesions, ulcers. No induration Neurologic: CN 2-12 grossly intact. Sensation intact, DTR normal. Strength 5/5 in all 4.  Psychiatric: Normal judgment and insight. Alert and oriented x 3. Normal mood.    Body mass index is 31.83 kg/m.      Labs on Admission: I have personally reviewed following labs and imaging studies  CBC: Recent Labs  Lab 02/01/24 1039  WBC 9.3  NEUTROABS 6.6  HGB 13.7  HCT 40.4  MCV 92.4  PLT 228   Basic Metabolic Panel: Recent Labs  Lab 02/01/24 1039  NA 135  K 3.7  CL 99  CO2 22  GLUCOSE 129*  BUN 14  CREATININE 0.92  CALCIUM 9.3   GFR: Estimated Creatinine Clearance: 44.4 mL/min (by C-G formula based on SCr of 0.92 mg/dL). Liver Function Tests: Recent Labs  Lab 02/01/24 1039  AST 30  ALT 29  ALKPHOS 75  BILITOT 0.6  PROT 6.9  ALBUMIN 4.1   No results for input(s): "LIPASE", "AMYLASE" in the last 168 hours. No results for input(s): "AMMONIA" in the last 168 hours. Coagulation Profile: Recent Labs  Lab 02/01/24 1039  INR 1.2   Cardiac Enzymes: No results for input(s): "CKTOTAL", "CKMB", "CKMBINDEX", "TROPONINI" in the last 168 hours. BNP (last 3 results) No results for input(s): "PROBNP" in the last 8760 hours. HbA1C: No results for  input(s): "HGBA1C" in the last 72 hours. CBG: No results for input(s): "GLUCAP" in the last 168 hours. Lipid Profile: No results for input(s): "CHOL", "HDL", "LDLCALC", "TRIG", "CHOLHDL", "LDLDIRECT" in the last 72 hours. Thyroid  Function Tests: No results for input(s): "TSH", "T4TOTAL", "FREET4", "T3FREE", "THYROIDAB" in the last 72 hours. Anemia Panel: No results for input(s): "VITAMINB12", "FOLATE", "FERRITIN", "TIBC", "IRON", "RETICCTPCT" in the last 72 hours. Urine analysis:    Component Value Date/Time   COLORURINE YELLOW 02/01/2024 1039   APPEARANCEUR CLOUDY (A) 02/01/2024 1039   LABSPEC 1.010 02/01/2024 1039   PHURINE 6.5 02/01/2024 1039   GLUCOSEU NEGATIVE 02/01/2024 1039   HGBUR SMALL (A) 02/01/2024 1039   BILIRUBINUR NEGATIVE 02/01/2024 1039   KETONESUR TRACE (A) 02/01/2024 1039   PROTEINUR 30 (A) 02/01/2024 1039   NITRITE POSITIVE (A) 02/01/2024 1039   LEUKOCYTESUR MODERATE (A) 02/01/2024 1039   Sepsis Labs: !!!!!!!!!!!!!!!!!!!!!!!!!!!!!!!!!!!!!!!!!!!! @LABRCNTIP (procalcitonin:4,lacticidven:4) )No results found for this or any previous visit (from the past 240 hours).   Radiological Exams on Admission: DG  Chest Port 1 View Result Date: 02/01/2024 CLINICAL DATA:  Fevers.  Cough.  Vomiting. EXAM: PORTABLE CHEST 1 VIEW COMPARISON:  01/08/2024 FINDINGS: Numerous leads and wires project over the chest. Patient rotated left. Borderline cardiomegaly. No pleural effusion or pneumothorax. Subsegmental atelectasis in both lung bases. Low lung volumes with resultant pulmonary interstitial prominence. No lobar consolidation. IMPRESSION: Borderline cardiomegaly with subsegmental atelectasis in both lung bases. Electronically Signed   By: Lore Rode M.D.   On: 02/01/2024 12:17    Nutritional status  All images have been reviewed by me personally.    Assessment/Plan Principal Problem:   UTI (urinary tract infection)    Urinary tract infection without hematuria -Will admit  the patient.  This is her second episode in the last month.  Possibly incomplete clearance during previous round of antibiotic treatment.  Will order CT renal study to rule out any infected stone or pyelonephritis or other complications.  Previous cultures have grown E. coli therefore we will continue IV Rocephin .  IV fluids.  Osteoarthritis - Tylenol   Generalized weakness - PT/OT    DVT prophylaxis: Lovenox  Code Status: Full code Family Communication: Daughter at bedside Consults called: None Admission status: Observation MedSurg  Status is: Observation The patient remains OBS appropriate and will d/c before 2 midnights.   Time Spent: 65 minutes.  >50% of the time was devoted to discussing the patients care, assessment, plan and disposition with other care givers along with counseling the patient about the risks and benefits of treatment.    Maggie Schooner MD [facility]  If 7PM-7AM, please contact night-coverage   02/01/2024, 5:08 PM

## 2024-02-01 NOTE — ED Triage Notes (Signed)
 Patient comes in from home. Family states she had two episodes of vomiting Saturday. She also states she has had fevers on and off since then and progressing weakness. She was recently hospitalized for UTI and dehydration. She fell out of bed yesterday.

## 2024-02-01 NOTE — Plan of Care (Signed)

## 2024-02-01 NOTE — Sepsis Progress Note (Signed)
 Sepsis protocol monitored by eLink ?

## 2024-02-01 NOTE — ED Provider Notes (Signed)
 [place] EM[facility] DEPARTMENT AT [facility] Provider Note   CSN: [id number] Arrival date & time: 02/01/24  1024     History  Chief Complaint  Patient presents with   Shelby Chan is a 89 y.o. female.  89 year old female who presents with fever x 3 days.  Recently treated for UTI several days ago.  Did have some emesis yesterday but none currently.  Notes diffuse myalgias.  Denies any cough or congestion.  Has not been short of breath.  Does note some suprapubic pressure without flank pain or tenderness.  Denies any diarrhea.  States he feels as if she has no energy.       Home Medications Prior to Admission medications   Medication Sig Start Date End Date Taking? Authorizing Provider  acetaminophen  (TYLENOL ) 650 MG CR tablet Take 1,300 mg by mouth every 8 (eight) hours as needed for pain.    [provider]  aspirin  EC 81 MG tablet Take 1 tablet (81 mg total) by mouth 2 (two) times daily. Continue taking once a day after a month Patient taking differently: Take 81 mg by mouth daily. Continue taking once a day after a month 11/13/21   Leona Rake, MD  Calcium Carbonate-Vitamin D  (CALTRATE 600+D PO) Take 1 tablet by mouth daily.    [provider]  cetirizine (ZYRTEC) 10 MG tablet Take 10 mg by mouth daily.    [provider]  Coenzyme Q10 100 MG capsule Take 100 mg by mouth daily.    [provider]  GEMTESA 75 MG TABS Take 1 tablet by mouth daily. 08/11/23   [provider]  Multiple Vitamin (MULTIVITAMIN WITH MINERALS) TABS tablet Take 1 tablet by mouth daily.    [provider]      Allergies    Patient has no known allergies.    Review of Systems   Review of Systems  All other systems reviewed and are negative.   Physical Exam Updated Vital Signs BP 95/71 (BP Location: Left Arm)   Pulse 93   Temp (!) 101 F (38.3 C)   Resp 20   Ht 1.626 m (5\' 4" )   Wt 84.1 kg   SpO2 94%   BMI 31.83  kg/m  Physical Exam Vitals and nursing note reviewed.  Constitutional:      General: She is not in acute distress.    Appearance: Normal appearance. She is well-developed. She is not toxic-appearing.  HENT:     Head: Normocephalic and atraumatic.  Eyes:     General: Lids are normal.     Conjunctiva/sclera: Conjunctivae normal.     Pupils: Pupils are equal, round, and reactive to light.  Neck:     Thyroid : No thyroid  mass.     Trachea: No tracheal deviation.  Cardiovascular:     Rate and Rhythm: Normal rate and regular rhythm.     Heart sounds: Normal heart sounds. No murmur heard.    No gallop.  Pulmonary:     Effort: Pulmonary effort is normal. No respiratory distress.     Breath sounds: Normal breath sounds. No stridor. No decreased breath sounds, wheezing, rhonchi or rales.  Abdominal:     General: There is no distension.     Palpations: Abdomen is soft.     Tenderness: There is no abdominal tenderness. There is no rebound.  Musculoskeletal:        General: No tenderness. Normal range of motion.     Cervical  back: Normal range of motion and neck supple.  Skin:    General: Skin is warm and dry.     Findings: No abrasion or rash.  Neurological:     Mental Status: She is alert and oriented to person, place, and time. Mental status is at baseline.     GCS: GCS eye subscore is 4. GCS verbal subscore is 5. GCS motor subscore is 6.     Cranial Nerves: No cranial nerve deficit.     Sensory: No sensory deficit.     Motor: Motor function is intact.  Psychiatric:        Attention and Perception: Attention normal.        Speech: Speech normal.        Behavior: Behavior normal.     ED Results / Procedures / Treatments   Labs (all labs ordered are listed, but only abnormal results are displayed) Labs Reviewed  CULTURE, BLOOD (ROUTINE X 2)  CULTURE, BLOOD (ROUTINE X 2)  COMPREHENSIVE METABOLIC PANEL WITH GFR  LACTIC ACID, PLASMA  LACTIC ACID, PLASMA  CBC WITH  DIFFERENTIAL/PLATELET  PROTIME-INR  URINALYSIS, W/ REFLEX TO CULTURE (INFECTION SUSPECTED)    EKG EKG Interpretation Date/Time:  Monday February 01 2024 10:40:49 EDT Ventricular Rate:  93 PR Interval:  129 QRS Duration:  130 QT Interval:  391 QTC Calculation: 446 R Axis:   48  Text Interpretation: Sinus rhythm Supraventricular bigeminy Consider right atrial enlargement Right bundle branch block ST elevation, consider lateral injury Confirmed by Lind Repine ([id number]) on 02/01/2024 11:23:43 AM  Radiology No results found.  Procedures Procedures    Medications Ordered in ED Medications - No data to display  ED Course/ Medical Decision Making/ A&P                                 Medical Decision Making Amount and/or Complexity of Data Reviewed Labs: ordered. Radiology: ordered.  Risk OTC drugs. Prescription drug management.   The patient is EKG shows's rhythm with bigeminy.  Chest x-ray per my dictation shows no acute findings.  Urinalysis is consistent with infection.  Lactate is normal.  Blood pressure soft and given IV fluid bolus as part of sepsis protocol.  Started on IV antibiotics with Rocephin .  Patient will require admission.  Discussed with family who is at bedside  CRITICAL CARE Performed by: Eden Goodpasture Total critical care time: 45 minutes Critical care time was exclusive of separately billable procedures and treating other patients. Critical care was necessary to treat or prevent imminent or life-threatening deterioration. Critical care was time spent personally by me on the following activities: development of treatment plan with patient and/or surrogate as well as nursing, discussions with consultants, evaluation of patient's response to treatment, examination of patient, obtaining history from patient or surrogate, ordering and performing treatments and interventions, ordering and review of laboratory studies, ordering and review of radiographic studies,  pulse oximetry and re-evaluation of patient's condition.         Final Clinical Impression(s) / ED Diagnoses Final diagnoses:  None    Rx / DC Orders ED Discharge Orders     None         Lind Repine, MD 02/01/24 1258

## 2024-02-02 DIAGNOSIS — N10 Acute tubulo-interstitial nephritis: Secondary | ICD-10-CM

## 2024-02-02 DIAGNOSIS — N3000 Acute cystitis without hematuria: Secondary | ICD-10-CM | POA: Diagnosis not present

## 2024-02-02 LAB — CBC
HCT: 37.7 % (ref 36.0–46.0)
Hemoglobin: 12 g/dL (ref 12.0–15.0)
MCH: 31.6 pg (ref 26.0–34.0)
MCHC: 31.8 g/dL (ref 30.0–36.0)
MCV: 99.2 fL (ref 80.0–100.0)
Platelets: 190 10*3/uL (ref 150–400)
RBC: 3.8 MIL/uL — ABNORMAL LOW (ref 3.87–5.11)
RDW: 12.5 % (ref 11.5–15.5)
WBC: 7.5 10*3/uL (ref 4.0–10.5)
nRBC: 0 % (ref 0.0–0.2)

## 2024-02-02 LAB — BASIC METABOLIC PANEL WITH GFR
Anion gap: 7 (ref 5–15)
BUN: 9 mg/dL (ref 8–23)
CO2: 21 mmol/L — ABNORMAL LOW (ref 22–32)
Calcium: 7.8 mg/dL — ABNORMAL LOW (ref 8.9–10.3)
Chloride: 107 mmol/L (ref 98–111)
Creatinine, Ser: 0.77 mg/dL (ref 0.44–1.00)
GFR, Estimated: >60 mL/min (ref >60)
Glucose, Bld: 106 mg/dL — ABNORMAL HIGH (ref 70–99)
Potassium: 3.1 mmol/L — ABNORMAL LOW (ref 3.5–5.1)
Sodium: 135 mmol/L (ref 135–145)

## 2024-02-02 LAB — PHOSPHORUS: Phosphorus: 2 mg/dL — ABNORMAL LOW (ref 2.5–4.6)

## 2024-02-02 LAB — MAGNESIUM: Magnesium: 2.1 mg/dL (ref 1.7–2.4)

## 2024-02-02 MED ORDER — POTASSIUM & SODIUM PHOSPHATES 280-160-250 MG PO PACK
1.0000 | PACK | Freq: Three times a day (TID) | ORAL | Status: DC
  Administered 2024-02-02 – 2024-02-03 (×5): 1 via ORAL
  Filled 2024-02-02 (×7): qty 1

## 2024-02-02 MED ORDER — POTASSIUM CHLORIDE CRYS ER 20 MEQ PO TBCR
40.0000 meq | EXTENDED_RELEASE_TABLET | Freq: Two times a day (BID) | ORAL | Status: DC
  Administered 2024-02-02 – 2024-02-03 (×3): 40 meq via ORAL
  Filled 2024-02-02 (×3): qty 2

## 2024-02-02 NOTE — Plan of Care (Signed)

## 2024-02-02 NOTE — Evaluation (Signed)
 Occupational Therapy Evaluation Patient Details Name: Shelby Chan MRN: [id number] DOB: June 15, 1933 Today's Date: 02/02/2024   History of Present Illness   Shelby Chan is a 89 yr old female admitted 02/01/24 with fever, poor oral intake, and weakness. She was found to have a UTI. PMH: osteoarthritis, HLD, GERD, R hip percutaneous screw fixation     Clinical Impressions The pt is currently presenting slightly below her baseline level of functioning for self-care management, as she presents with the below listed deficits (see OT problem list). At current, she requires SBA to CGA for tasks, including lower body dressing, sit to stand, ambulation, and toileting. She reports a history of R hip surgery, with resultant bilateral hip pain. She will benefit from further OT services in the acute care setting to maximize her functional independence and to facilitate her safe return to her ILF.      If plan is discharge home, recommend the following:   Direct supervision/assist for financial management;Direct supervision/assist for medications management;Assistance with cooking/housework     Functional Status Assessment   Patient has had a recent decline in their functional status and demonstrates the ability to make significant improvements in function in a reasonable and predictable amount of time.     Equipment Recommendations   None recommended by OT     Recommendations for Other Services         Precautions/Restrictions   Precautions Precautions: Fall Restrictions Weight Bearing Restrictions Per Provider Order: No     Mobility Bed Mobility        General bed mobility comments: pt was received seated in the bedside chair    Transfers Overall transfer level: Needs assistance Equipment used: None Transfers: Sit to/from Stand Sit to Stand: Supervision                  Balance     Sitting balance-Leahy Scale: Good       Standing balance-Leahy  Scale: Fair         ADL either performed or assessed with clinical judgement   ADL Overall ADL's : Needs assistance/impaired Eating/Feeding: Independent;Sitting   Grooming: Supervision/safety;Set up;Standing           Upper Body Dressing : Set up;Sitting   Lower Body Dressing: Supervision/safety;Set up Lower Body Dressing Details (indicate cue type and reason): She doffed then donned her socks in sitting at chair level. Toilet Transfer: Supervision/safety;Ambulation                    Pertinent Vitals/Pain Pain Assessment Pain Assessment: Faces Pain Score: 3  Pain Location: hips Pain Intervention(s): Monitored during session, Limited activity within patient's tolerance     Extremity/Trunk Assessment Upper Extremity Assessment Upper Extremity Assessment: Overall WFL for tasks assessed;Right hand dominant   Lower Extremity Assessment Lower Extremity Assessment: Overall WFL for tasks assessed       Communication Communication Communication: No apparent difficulties   Cognition Arousal: Alert Behavior During Therapy: WFL for tasks assessed/performed               OT - Cognition Comments: Oriented to person, place, and month. Reported the year to be "2045." Partially oriented to situation.                 Following commands: Intact                  Home Living Family/patient expects to be discharged to:: Private residence Living Arrangements: Alone Available Help at Discharge:  Family;Available PRN/intermittently Type of Home: Independent living facility Home Access: Level entry     Home Layout: One level     Bathroom Shower/Tub: Producer, television/film/video: Handicapped height Bathroom Accessibility: Yes   Home Equipment: Shower seat - built in;Cane - single Librarian, academic (2 wheels)   Additional Comments: independent side of ALF, handicap accessible bathroom      Prior Functioning/Environment Prior Level of  Function : Independent/Modified Independent;Driving             Mobility Comments:  (She used a cane occasionally for ambulation, however stated, " I try to walk without it.") ADLs Comments: She was independent with ADLs, most meals are provided at the facility, and cleaning services were provided by the ILF staff. The pt stated she does drive periodically.    OT Problem List: Decreased strength;Impaired balance (sitting and/or standing);Decreased knowledge of use of DME or AE;Pain   OT Treatment/Interventions: Self-care/ADL training;Therapeutic exercise;Therapeutic activities;Energy conservation;Patient/family education;DME and/or AE instruction      OT Goals(Current goals can be found in the care plan section)   Acute Rehab OT Goals OT Goal Formulation: With patient Time For Goal Achievement: 02/16/24 Potential to Achieve Goals: Good ADL Goals Pt Will Perform Grooming: with modified independence;standing Pt Will Perform Lower Body Dressing: with modified independence;sitting/lateral leans;sit to/from stand Pt Will Transfer to Toilet: with modified independence;ambulating Pt Will Perform Toileting - Clothing Manipulation and hygiene: with modified independence;sit to/from stand   OT Frequency:  Min 2X/week       AM-PAC OT "6 Clicks" Daily Activity     Outcome Measure Help from another person eating meals?: None Help from another person taking care of personal grooming?: None Help from another person toileting, which includes using toliet, bedpan, or urinal?: A Little Help from another person bathing (including washing, rinsing, drying)?: A Little Help from another person to put on and taking off regular upper body clothing?: A Little Help from another person to put on and taking off regular lower body clothing?: A Little 6 Click Score: 20   End of Session Equipment Utilized During Treatment: Other (comment) (N/A) Nurse Communication: Mobility status  Activity Tolerance:  Patient tolerated treatment well Patient left: in chair;with call bell/phone within reach;with chair alarm set  OT Visit Diagnosis: History of falling (Z91.81);Muscle weakness (generalized) (M62.81)                Time: 1610-9604 OT Time Calculation (min): 28 min Charges:  OT General Charges $OT Visit: 1 Visit OT Evaluation $OT Eval Moderate Complexity: 1 Mod    Jerzie Bieri L Liany Mumpower, OTR/L 02/02/2024, 11:38 AM

## 2024-02-02 NOTE — Care Management Obs Status (Signed)
 MEDICARE OBSERVATION STATUS NOTIFICATION   Patient Details  Name: Shelby Chan MRN: [id number] Date of Birth: [birth year]   Medicare Observation Status Notification Given:  Yes    Kathryn Parish, RN 02/02/2024, 11:43 AM

## 2024-02-02 NOTE — TOC Initial Note (Addendum)
 Transition of Care [facility]) - Initial/Assessment Note    Patient Details  Name: Shelby Chan MRN: [id number] Date of Birth: [birth year]  Transition of Care [facility]) CM/SW Contact:    Kathryn Parish, RN Phone Number: 02/02/2024, 9:36 AM  Clinical Narrative:                 CM spoke with patient in room and daughter on phone. PTA patient lives alone at [facility]. DME - cane, rollator, walker. No HH, oxygen or SDOH needs. Patient daughter will transport home at discharge. TOC will follow for needs. 11:45 AM MOON completed  Expected Discharge Plan: Assisted Living ([facility] of [address]) Barriers to Discharge: Continued Medical Work up   Patient Goals and CMS Choice Patient states their goals for this hospitalization and ongoing recovery are:: Home   Choice offered to / list presented to : NA [place] ownership interest in [facility].provided to:: Parent NA    Expected Discharge Plan and Services In-house Referral: NA Discharge Planning Services: CM Consult Post Acute Care Choice: NA Living arrangements for the past 2 months: Independent Living Facility                 DME Arranged: N/A DME Agency: NA       HH Arranged: NA HH Agency: NA        Prior Living Arrangements/Services Living arrangements for the past 2 months: Independent Living Facility Lives with:: Self Patient language and need for interpreter reviewed:: Yes Do you feel safe going back to the place where you live?: Yes      Need for Family Participation in Patient Care: Yes (Comment) Care giver support system in place?: Yes (comment) Current home services: DME (cane, rollator, walker) Criminal Activity/Legal Involvement Pertinent to Current Situation/Hospitalization: No - Comment as needed  Activities of Daily Living   ADL Screening (condition at time of admission) Independently performs ADLs?: Yes (appropriate for developmental age) Is the patient deaf or  have difficulty hearing?: No Does the patient have difficulty seeing, even when wearing glasses/contacts?: No Does the patient have difficulty concentrating, remembering, or making decisions?: No  Permission Sought/Granted Permission sought to share information with : Case Manager Permission granted to share information with : Yes, Verbal Permission Granted  Share Information with NAME: Ottie Blonder     Permission granted to share info w Relationship: daughter  Permission granted to share info w Contact Information: [phone]  Emotional Assessment Appearance:: Appears stated age Attitude/Demeanor/Rapport: Gracious Affect (typically observed): Appropriate Orientation: : Oriented to Self, Oriented to Place, Oriented to  Time, Oriented to Situation Alcohol / Substance Use: Not Applicable Psych Involvement: No (comment)  Admission diagnosis:  UTI (urinary tract infection) [N39.0] Urinary tract infection with hematuria, site unspecified [N39.0, R31.9] Patient Active Problem List   Diagnosis Date Noted   Chronic GERD 01/09/2024   Acute metabolic encephalopathy 01/08/2024   COVID-19 virus infection 01/08/2024   Hypokalemia 01/08/2024   UTI (urinary tract infection) 01/08/2024   Tear of right gluteus medius tendon 09/24/2023   Pain of right hip joint 08/11/2023   Age-related osteoporosis without current pathological fracture 06/15/2023   Closed right hip fracture (HCC) 11/06/2021   Impacted fracture of right hip (HCC) secondary to fall 11/06/2021   Fall 11/06/2021   Pain in left ankle and joints of left foot 06/12/2021   Pain in left leg 04/13/2019   PCP:  Maggie Schooner, MD Pharmacy:   [facility],  [place] - [id number] [place] [address] Phone: [phone] Fax: [phone]     Social Drivers of Health (SDOH) Social History: SDOH Screenings   Food Insecurity: No Food Insecurity (02/01/2024)  Housing: Low Risk  (02/01/2024)   Transportation Needs: No Transportation Needs (02/01/2024)  Utilities: Not At Risk (02/01/2024)  Social Connections: Moderately Integrated (02/01/2024)  Tobacco Use: Low Risk  (02/01/2024)   SDOH Interventions: Housing Interventions: Intervention Not Indicated Transportation Interventions: Intervention Not Indicated Utilities Interventions: Intervention Not Indicated   Readmission Risk Interventions    01/08/2024    9:35 PM  Readmission Risk Prevention Plan  Post Dischage Appt Complete  Medication Screening Complete  Transportation Screening Complete

## 2024-02-02 NOTE — Evaluation (Signed)
 Physical Therapy Evaluation Patient Details Name: Shelby Chan MRN: [id number] DOB: [birth year] Today's Date: 02/02/2024  History of Present Illness  Shelby Chan is a 89 y.o. female admitted 02/01/24 with UTI with medical history significant of osteoarthritis, HLD, GERD comes to the hospital with complaints of fever.  Clinical Impression  Pt admitted with above diagnosis. Pt currently resides in ILF, has access to walker and cane but does not use them. She reports that 2 weeks ago she had a fall after slipping on wet pavement while trying to rush at church. She is able to progress well this session initially showing CGA level of assist progressing to supervision with all tasks. Pt currently with functional limitations due to the deficits listed below (see PT Problem List). Pt will benefit from acute skilled PT to increase their independence and safety with mobility to allow discharge.           If plan is discharge home, recommend the following: A little help with bathing/dressing/bathroom   Can travel by private vehicle        Equipment Recommendations None recommended by PT  Recommendations for Other Services       Functional Status Assessment Patient has had a recent decline in their functional status and demonstrates the ability to make significant improvements in function in a reasonable and predictable amount of time.     Precautions / Restrictions Precautions Precautions: Fall Recall of Precautions/Restrictions: Intact Restrictions Weight Bearing Restrictions Per Provider Order: No      Mobility  Bed Mobility               General bed mobility comments: pt in recliner prior to PT arrival, returns to recliner    Transfers Overall transfer level: Needs assistance Equipment used: Rolling walker (2 wheels) Transfers: Sit to/from Stand Sit to Stand: Supervision           General transfer comment: cues for hand placement     Ambulation/Gait Ambulation/Gait assistance: Supervision Gait Distance (Feet): 200 Feet Assistive device: Rolling walker (2 wheels) Gait Pattern/deviations: Step-through pattern, Decreased stride length, Wide base of support Gait velocity: dec     General Gait Details: pt amb using reciprocal and symmetric pattern with RW, requires cues to keep head up and observe navigation, able to improve with progression of session. Pt trials small spaces and frequent directional changes with RW and does well, allowing herself to take increased time for safety  Stairs            Wheelchair Mobility     Tilt Bed    Modified Rankin (Stroke Patients Only)       Balance Overall balance assessment: Needs assistance Sitting-balance support: Feet unsupported, No upper extremity supported Sitting balance-Leahy Scale: Good     Standing balance support: No upper extremity supported, During functional activity Standing balance-Leahy Scale: Fair                               Pertinent Vitals/Pain Pain Assessment Pain Assessment: Faces Faces Pain Scale: Hurts little more Pain Location: R lower leg, lateral aspect Pain Descriptors / Indicators: Aching, Discomfort, Nagging, Sore Pain Intervention(s): Limited activity within patient's tolerance, Monitored during session, Repositioned    Home Living Family/patient expects to be discharged to:: Private residence Living Arrangements: Alone Available Help at Discharge: Family;Available PRN/intermittently Type of Home: Independent living facility Home Access: Level entry       Home Layout: One  level Home Equipment: Rollator (4 wheels);Rolling Walker (2 wheels);Cane - single point Additional Comments: independent side of ALF, handicap accessible bathroom    Prior Function Prior Level of Function : Independent/Modified Independent             Mobility Comments: mod independent with home and close community ambulation,  family will drive her if destination is outside of [place]       Extremity/Trunk Assessment   Upper Extremity Assessment Upper Extremity Assessment: Overall WFL for tasks assessed    Lower Extremity Assessment Lower Extremity Assessment: Generalized weakness       Communication   Communication Communication: No apparent difficulties    Cognition Arousal: Alert Behavior During Therapy: WFL for tasks assessed/performed   PT - Cognitive impairments: No apparent impairments                         Following commands: Intact       Cueing Cueing Techniques: Verbal cues, Gestural cues     General Comments      Exercises     Assessment/Plan    PT Assessment Patient needs continued PT services  PT Problem List Decreased strength;Decreased activity tolerance;Decreased mobility;Decreased balance       PT Treatment Interventions DME instruction;Functional mobility training;Balance training;Patient/family education;Gait training;Therapeutic activities;Therapeutic exercise    PT Goals (Current goals can be found in the Care Plan section)  Acute Rehab PT Goals Patient Stated Goal: get well and return home PT Goal Formulation: With patient Time For Goal Achievement: 02/16/24 Potential to Achieve Goals: Good    Frequency Min 3X/week     Co-evaluation               AM-PAC PT "6 Clicks" Mobility  Outcome Measure Help needed turning from your back to your side while in a flat bed without using bedrails?: A Little Help needed moving from lying on your back to sitting on the side of a flat bed without using bedrails?: A Little Help needed moving to and from a bed to a chair (including a wheelchair)?: A Little Help needed standing up from a chair using your arms (e.g., wheelchair or bedside chair)?: A Little Help needed to walk in hospital room?: A Little Help needed climbing 3-5 steps with a railing? : A Little 6 Click Score: 18    End of Session  Equipment Utilized During Treatment: Gait belt Activity Tolerance: Patient tolerated treatment well Patient left: in chair;with call bell/phone within reach;with chair alarm set Nurse Communication: Mobility status PT Visit Diagnosis: History of falling (Z91.81);Difficulty in walking, not elsewhere classified (R26.2)    Time: 1610-9604 PT Time Calculation (min) (ACUTE ONLY): 33 min   Charges:   PT Evaluation $PT Eval Low Complexity: 1 Low PT Treatments $Gait Training: 8-22 mins PT General Charges $$ ACUTE PT VISIT: 1 Visit         Darien Eden, PT Acute Rehabilitation Services Office: [phone] 02/02/2024   Serafin Dames 02/02/2024, 10:56 AM

## 2024-02-02 NOTE — Progress Notes (Signed)
 PROGRESS NOTE    Shelby Chan  ZOX:[id number] DOB: [birth year] DOA: 02/01/2024 PCP: Maggie Schooner, MD    Brief Narrative:  89 year old with history of osteoarthritis, hyperlipidemia, GERD, recent history of UTI presented with about 1 week of intermittent fever and poor oral intake.  Urinalysis was abnormal.  Hemodynamically stable in the ER, admitted with IV Rocephin  due to significant symptoms. Patient lives independent living facility in [place].  Subjective: Patient seen and examined.  She tells me that her abdominal bloating and pain has improved. She would like to go home, however she agreed to stay due to significant infection and IV antibiotics need.  Assessment & Plan:   Acute UTI, suspected left-sided pyelonephritis: Urine culture and blood culture drawn.  Pending so far. Recent history of E. coli, currently on Rocephin .  Continue until final cultures.  Will treat with prolonged antibiotic therapy this time. CT scan of the abdomen pelvis was done, shows significant inflammatory changes on the left without hydronephrosis or bladder retention.  Will check postvoid residuals. Mobilize with PT OT.  Hypokalemia: Replace with oral potassium.  Magnesium is adequate.  Hypophosphatemia: Replace with oral phosphate.   DVT prophylaxis: enoxaparin  (LOVENOX ) injection 40 mg Start: 02/01/24 1815 SCDs Start: 02/01/24 1717   Code Status: Full code Family Communication: None at the bedside Disposition Plan: Status is: Observation The patient will require care spanning > 2 midnights and should be moved to inpatient because: Significant infection, IV antibiotics     Consultants:  None  Procedures:  None  Antimicrobials:  Rocephin  6/2---     Objective: Vitals:   02/01/24 1544 02/01/24 1642 02/01/24 1758 02/01/24 1952  BP: (!) 146/82 (!) 183/91 (!) 155/73 (!) 151/65  Pulse: 93 97 (!) 101 90  Resp: 20 16  16   Temp: 98.1 F (36.7 C) 98.2 F (36.8 C)  98.7 F (37.1  C)  TempSrc: Oral Oral  Oral  SpO2: 96% 100%  95%  Weight:      Height:        Intake/Output Summary (Last 24 hours) at 02/02/2024 1059 Last data filed at 02/02/2024 0852 Gross per 24 hour  Intake 3852.11 ml  Output 1400 ml  Net 2452.11 ml   Filed Weights   02/01/24 1030  Weight: 84.1 kg    Examination:  General exam: Appears calm and comfortable.  Sitting in chair. Respiratory system: Clear to auscultation. Respiratory effort normal. Cardiovascular system: S1 & S2 heard, RRR. Gastrointestinal system: Abdomen is nondistended, soft and nontender. No organomegaly or masses felt. Normal bowel sounds heard. Central nervous system: Alert and oriented. No focal neurological deficits. Extremities: Symmetric 5 x 5 power. Skin: No rashes, lesions or ulcers Psychiatry: Judgement and insight appear normal. Mood & affect appropriate.     Data Reviewed: I have personally reviewed following labs and imaging studies  CBC: Recent Labs  Lab 02/01/24 1039 02/01/24 1842 02/02/24 0426  WBC 9.3 9.0 7.5  NEUTROABS 6.6  --   --   HGB 13.7 13.1 12.0  HCT 40.4 41.1 37.7  MCV 92.4 98.1 99.2  PLT 228 210 190   Basic Metabolic Panel: Recent Labs  Lab 02/01/24 1039 02/01/24 1842 02/02/24 0426  NA 135  --  135  K 3.7  --  3.1*  CL 99  --  107  CO2 22  --  21*  GLUCOSE 129*  --  106*  BUN 14  --  9  CREATININE 0.92 0.71 0.77  CALCIUM 9.3  --  7.8*  MG  --   --  2.1  PHOS  --   --  2.0*   GFR: Estimated Creatinine Clearance: 51 mL/min (by C-G formula based on SCr of 0.77 mg/dL). Liver Function Tests: Recent Labs  Lab 02/01/24 1039  AST 30  ALT 29  ALKPHOS 75  BILITOT 0.6  PROT 6.9  ALBUMIN 4.1   No results for input(s): "LIPASE", "AMYLASE" in the last 168 hours. No results for input(s): "AMMONIA" in the last 168 hours. Coagulation Profile: Recent Labs  Lab 02/01/24 1039  INR 1.2   Cardiac Enzymes: No results for input(s): "CKTOTAL", "CKMB", "CKMBINDEX", "TROPONINI"  in the last 168 hours. BNP (last 3 results) No results for input(s): "PROBNP" in the last 8760 hours. HbA1C: No results for input(s): "HGBA1C" in the last 72 hours. CBG: No results for input(s): "GLUCAP" in the last 168 hours. Lipid Profile: No results for input(s): "CHOL", "HDL", "LDLCALC", "TRIG", "CHOLHDL", "LDLDIRECT" in the last 72 hours. Thyroid  Function Tests: Recent Labs    02/01/24 1842  TSH 2.026   Anemia Panel: No results for input(s): "VITAMINB12", "FOLATE", "FERRITIN", "TIBC", "IRON", "RETICCTPCT" in the last 72 hours. Sepsis Labs: Recent Labs  Lab 02/01/24 1039  LATICACIDVEN 1.3    Recent Results (from the past 240 hours)  Culture, blood (Routine x 2)     Status: None (Preliminary result)   Collection Time: 02/01/24 10:34 AM   Specimen: BLOOD  Result Value Ref Range Status   Specimen Description   Final    BLOOD RIGHT ANTECUBITAL Performed at [facility], [address], [place], [place] [address]    Special Requests   Final    BOTTLES DRAWN AEROBIC AND ANAEROBIC Blood Culture adequate volume Performed at [facility], [address] [place], [place] [address]    Culture   Final    NO GROWTH < 24 HOURS Performed at [facility], [address], [place], [place] [address]    Report Status PENDING  Incomplete  Culture, blood (Routine x 2)     Status: None (Preliminary result)   Collection Time: 02/01/24 10:39 AM   Specimen: BLOOD  Result Value Ref Range Status   Specimen Description   Final    BLOOD BLOOD RIGHT HAND Performed at [facility], [address] [place], [place] [address]    Special Requests   Final    BOTTLES DRAWN AEROBIC AND ANAEROBIC Blood Culture adequate volume Performed at [facility], [address], [place], [place] [address]    Culture   Final    NO GROWTH < 24 HOURS Performed at [facility], [address], [place], [place]  [address]    Report Status PENDING  Incomplete         Radiology Studies: CT RENAL STONE STUDY Result Date: 02/01/2024 CLINICAL DATA:  Abdominal/flank pain, urolithiasis., EXAM: CT ABDOMEN AND PELVIS WITHOUT CONTRAST TECHNIQUE: Multidetector CT imaging of the abdomen and pelvis was performed following the standard protocol without IV contrast. RADIATION DOSE REDUCTION: This exam was performed according to the departmental dose-optimization program which includes automated exposure control, adjustment of the mA and/or kV according to patient size and/or use of iterative reconstruction technique. COMPARISON:  None Available. FINDINGS: Lower chest: No acute abnormality.  Small hiatal hernia Hepatobiliary: No focal liver abnormality is seen. Status post cholecystectomy. No biliary dilatation. Pancreas: Unremarkable Spleen: Unremarkable Adrenals/Urinary Tract: The adrenal glands are unremarkable. The kidneys are normal in size and position. Minimally rim calcified  water attenuation exophytic cortical cyst arises from the upper pole the right kidney, compatible with Bosniak class 2 cyst, which no follow-up imaging is recommended. The left kidney demonstrates moderate perinephric and peripelvic inflammatory stranding in keeping with a unilateral inflammatory process such as pyelonephritis or, less commonly, acute ischemia. There is no hydronephrosis. No intrarenal or ureteral calculi are identified. No perinephric fluid collections. The bladder is unremarkable. Stomach/Bowel: Stomach is within normal limits. Appendix is not clearly identified, however, no secondary signs of appendicitis within the right lower quadrant. No evidence of bowel wall thickening, distention, or inflammatory changes. Vascular/Lymphatic: Aortic atherosclerosis. No enlarged abdominal or pelvic lymph nodes. Reproductive: Lobulated enlarged uterus is in keeping with a probable fibroid uterus. The pelvic organs are otherwise unremarkable. Other:  No abdominal wall hernia or abnormality. No abdominopelvic ascites. Musculoskeletal: Right hip ORIF has been performed. Osseous structures otherwise age-appropriate. No acute bone abnormality. No lytic blastic bone lesion. IMPRESSION: 1. Moderate left perinephric and peripelvic inflammatory stranding in keeping with a unilateral inflammatory process such as pyelonephritis or, less commonly, acute ischemia. Correlation with urinalysis and urine culture is recommended. No hydronephrosis. No urolithiasis. 2. Small hiatal hernia. 3. Fibroid uterus. 4. Aortic atherosclerosis. Aortic Atherosclerosis (ICD10-I70.0). Electronically Signed   By: Worthy Heads M.D.   On: 02/01/2024 22:33   DG Chest Port 1 View Result Date: 02/01/2024 CLINICAL DATA:  Fevers.  Cough.  Vomiting. EXAM: PORTABLE CHEST 1 VIEW COMPARISON:  01/08/2024 FINDINGS: Numerous leads and wires project over the chest. Patient rotated left. Borderline cardiomegaly. No pleural effusion or pneumothorax. Subsegmental atelectasis in both lung bases. Low lung volumes with resultant pulmonary interstitial prominence. No lobar consolidation. IMPRESSION: Borderline cardiomegaly with subsegmental atelectasis in both lung bases. Electronically Signed   By: Lore Rode M.D.   On: 02/01/2024 12:17        Scheduled Meds:  aspirin  EC  81 mg Oral Daily   enoxaparin  (LOVENOX ) injection  40 mg Subcutaneous Q24H   potassium & sodium phosphates  1 packet Oral TID WC & HS   potassium chloride   40 mEq Oral BID   Continuous Infusions:  cefTRIAXone  (ROCEPHIN )  IV       LOS: 0 days    Time spent: 52 minutes    Vada Garibaldi, MD [facility]

## 2024-02-03 DIAGNOSIS — N3000 Acute cystitis without hematuria: Secondary | ICD-10-CM | POA: Diagnosis not present

## 2024-02-03 DIAGNOSIS — N10 Acute tubulo-interstitial nephritis: Secondary | ICD-10-CM | POA: Diagnosis not present

## 2024-02-03 LAB — CBC
HCT: 37.7 % (ref 36.0–46.0)
Hemoglobin: 11.8 g/dL — ABNORMAL LOW (ref 12.0–15.0)
MCH: 30.7 pg (ref 26.0–34.0)
MCHC: 31.3 g/dL (ref 30.0–36.0)
MCV: 98.2 fL (ref 80.0–100.0)
Platelets: 212 10*3/uL (ref 150–400)
RBC: 3.84 MIL/uL — ABNORMAL LOW (ref 3.87–5.11)
RDW: 12.5 % (ref 11.5–15.5)
WBC: 5.8 10*3/uL (ref 4.0–10.5)
nRBC: 0 % (ref 0.0–0.2)

## 2024-02-03 LAB — BASIC METABOLIC PANEL WITH GFR
Anion gap: 9 (ref 5–15)
BUN: 11 mg/dL (ref 8–23)
CO2: 21 mmol/L — ABNORMAL LOW (ref 22–32)
Calcium: 8.1 mg/dL — ABNORMAL LOW (ref 8.9–10.3)
Chloride: 108 mmol/L (ref 98–111)
Creatinine, Ser: 0.78 mg/dL (ref 0.44–1.00)
GFR, Estimated: >60 mL/min (ref >60)
Glucose, Bld: 102 mg/dL — ABNORMAL HIGH (ref 70–99)
Potassium: 3.9 mmol/L (ref 3.5–5.1)
Sodium: 138 mmol/L (ref 135–145)

## 2024-02-03 LAB — MAGNESIUM: Magnesium: 2.2 mg/dL (ref 1.7–2.4)

## 2024-02-03 MED ORDER — TAMSULOSIN HCL 0.4 MG PO CAPS
0.4000 mg | ORAL_CAPSULE | Freq: Every day | ORAL | Status: DC
  Administered 2024-02-03: 0.4 mg via ORAL
  Filled 2024-02-03: qty 1

## 2024-02-03 MED ORDER — CEPHALEXIN 500 MG PO CAPS: 500.0000 mg | ORAL_CAPSULE | Freq: Three times a day (TID) | ORAL | 0 refills | Status: AC

## 2024-02-03 MED ORDER — TAMSULOSIN HCL 0.4 MG PO CAPS: 0.4000 mg | ORAL_CAPSULE | Freq: Every day | ORAL | 2 refills | Status: DC

## 2024-02-03 MED ORDER — SODIUM CHLORIDE 0.9 % IV SOLN: 2.0000 g | Freq: Once | INTRAVENOUS | Status: DC

## 2024-02-03 NOTE — Progress Notes (Incomplete)
 Pt

## 2024-02-03 NOTE — Discharge Summary (Signed)
 Physician Discharge Summary  Ginette Bradway Zuk BJY:[id number] DOB: [birth year] DOA: 02/01/2024  PCP: Maggie Schooner, MD  Admit date: 02/01/2024 Discharge date: 02/03/2024  Admitted From: Independent living facility Disposition: Independent living facility  Recommendations for Outpatient Follow-up:  Follow up with PCP in 1-2 weeks Please obtain BMP/CBC in one week Urology referral made   Home Health: PT/OT Equipment/Devices: Walker  Discharge Condition: Stable CODE STATUS: Full code Diet recommendation: Regular diet  Discharge summary: 89 year old with history of osteoarthritis, hyperlipidemia, GERD, recent history of UTI presented with about 1 week of intermittent fever and poor oral intake.  Urinalysis was abnormal.  Hemodynamically stable in the ER, admitted with IV Rocephin  due to significant symptoms. Patient lives independent living facility in [place].  Acute UTI, suspected left-sided pyelonephritis: Blood cultures negative.  Urine culture with more than 100,000 colonies of E. coli, more than 100,000 colonies of Aerococcus.  Clinically improved.  Currently on Rocephin  day 3 today. CT scan of the abdomen pelvis was done, shows significant inflammatory changes on the left without hydronephrosis or bladder retention.  Postvoid residuals, 270 mL, 130 mL.  Renal functions normal. Responded well to Rocephin , will continue Keflex for 10 additional days.  Treating prolonged therapy due to recurrence. And was prescribed Flomax.  She was instructed to do double voiding to avoid postvoid residuals. May benefit with urodynamic studies.  Referral made to urology in [place].  Electrolytes are replaced and adequate. Patient will benefit with working with PT OT at her independent living facility and this was arranged through home health.  Stable for discharge home.   Discharge Diagnoses:  Principal Problem:   UTI (urinary tract infection)    Discharge Instructions  Discharge  Instructions     Ambulatory referral to Urology   Complete by: As directed    Diet general   Complete by: As directed    Discharge instructions   Complete by: As directed    Double voiding of Urine as instructed . Avoid constipation   Increase activity slowly   Complete by: As directed    No wound care   Complete by: As directed    No wound care   Complete by: As directed       Allergies as of 02/03/2024   No Known Allergies      Medication List     TAKE these medications    acetaminophen  650 MG CR tablet Commonly known as: TYLENOL  Take 1,300 mg by mouth every 8 (eight) hours as needed for pain.   aspirin  EC 81 MG tablet Take 1 tablet (81 mg total) by mouth 2 (two) times daily. Continue taking once a day after a month What changed:  when to take this additional instructions   CALTRATE 600+D PO Take 1 tablet by mouth daily.   Centrum Silver Women 50+ Tabs Take 1 tablet by mouth daily with breakfast.   cephALEXin 500 MG capsule Commonly known as: KEFLEX Take 1 capsule (500 mg total) by mouth 3 (three) times daily for 10 days.   cetirizine 10 MG tablet Commonly known as: ZYRTEC Take 10 mg by mouth daily.   Coenzyme Q10 100 MG capsule Take 100 mg by mouth daily.   Myrbetriq 25 MG Tb24 tablet Generic drug: mirabegron ER Take 25 mg by mouth daily.   tamsulosin 0.4 MG Caps capsule Commonly known as: FLOMAX Take 1 capsule (0.4 mg total) by mouth daily.        No Known Allergies  Consultations: None   Procedures/Studies: CT  RENAL STONE STUDY Result Date: 02/01/2024 CLINICAL DATA:  Abdominal/flank pain, urolithiasis., EXAM: CT ABDOMEN AND PELVIS WITHOUT CONTRAST TECHNIQUE: Multidetector CT imaging of the abdomen and pelvis was performed following the standard protocol without IV contrast. RADIATION DOSE REDUCTION: This exam was performed according to the departmental dose-optimization program which includes automated exposure control, adjustment of the mA  and/or kV according to patient size and/or use of iterative reconstruction technique. COMPARISON:  None Available. FINDINGS: Lower chest: No acute abnormality.  Small hiatal hernia Hepatobiliary: No focal liver abnormality is seen. Status post cholecystectomy. No biliary dilatation. Pancreas: Unremarkable Spleen: Unremarkable Adrenals/Urinary Tract: The adrenal glands are unremarkable. The kidneys are normal in size and position. Minimally rim calcified water attenuation exophytic cortical cyst arises from the upper pole the right kidney, compatible with Bosniak class 2 cyst, which no follow-up imaging is recommended. The left kidney demonstrates moderate perinephric and peripelvic inflammatory stranding in keeping with a unilateral inflammatory process such as pyelonephritis or, less commonly, acute ischemia. There is no hydronephrosis. No intrarenal or ureteral calculi are identified. No perinephric fluid collections. The bladder is unremarkable. Stomach/Bowel: Stomach is within normal limits. Appendix is not clearly identified, however, no secondary signs of appendicitis within the right lower quadrant. No evidence of bowel wall thickening, distention, or inflammatory changes. Vascular/Lymphatic: Aortic atherosclerosis. No enlarged abdominal or pelvic lymph nodes. Reproductive: Lobulated enlarged uterus is in keeping with a probable fibroid uterus. The pelvic organs are otherwise unremarkable. Other: No abdominal wall hernia or abnormality. No abdominopelvic ascites. Musculoskeletal: Right hip ORIF has been performed. Osseous structures otherwise age-appropriate. No acute bone abnormality. No lytic blastic bone lesion. IMPRESSION: 1. Moderate left perinephric and peripelvic inflammatory stranding in keeping with a unilateral inflammatory process such as pyelonephritis or, less commonly, acute ischemia. Correlation with urinalysis and urine culture is recommended. No hydronephrosis. No urolithiasis. 2. Small  hiatal hernia. 3. Fibroid uterus. 4. Aortic atherosclerosis. Aortic Atherosclerosis (ICD10-I70.0). Electronically Signed   By: Worthy Heads M.D.   On: 02/01/2024 22:33   DG Chest Port 1 View Result Date: 02/01/2024 CLINICAL DATA:  Fevers.  Cough.  Vomiting. EXAM: PORTABLE CHEST 1 VIEW COMPARISON:  01/08/2024 FINDINGS: Numerous leads and wires project over the chest. Patient rotated left. Borderline cardiomegaly. No pleural effusion or pneumothorax. Subsegmental atelectasis in both lung bases. Low lung volumes with resultant pulmonary interstitial prominence. No lobar consolidation. IMPRESSION: Borderline cardiomegaly with subsegmental atelectasis in both lung bases. Electronically Signed   By: Lore Rode M.D.   On: 02/01/2024 12:17   MR BRAIN WO CONTRAST Result Date: 01/08/2024 CLINICAL DATA:  Mental status change, found down on floor. EXAM: MRI HEAD WITHOUT CONTRAST TECHNIQUE: Multiplanar, multiecho pulse sequences of the brain and surrounding structures were obtained without intravenous contrast. COMPARISON:  CT head earlier same day. FINDINGS: Brain: No acute infarct. Scattered and confluent T2/FLAIR hyperintensity in the periventricular and subcortical white matter suggestive of chronic microvascular ischemic changes. There are numerous foci suggestive of prominent perivascular spaces with possible superimposed remote lacunar infarcts involving the basal ganglia and corona radiata as well as the left frontal lobe white matter. Remote infarcts noted in the pons and right cerebellar white matter. Susceptibility along multiple sulci of the anterior left frontal lobe suggestive of remote hemorrhage. No mass lesion or midline shift. Posterior fossa is unremarkable. Normal appearance of midline structures. The basilar cisterns are patent. No extra-axial fluid collections. Ventricles: Prominence of the lateral ventricles suggestive of underlying parenchymal volume loss. Vascular: Skull base flow voids are  visualized.  Skull and upper cervical spine: Degenerative changes in the visualized cervical spine. Visualized calvarium is unremarkable. 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 Sinuses/Orbits: Scattered mucosal thickening throughout the paranasal sinuses most pronounced in the ethmoid sinuses. Bilateral lens replacement. Other: Small right mastoid effusion. IMPRESSION: No acute intracranial abnormality. Moderate chronic microvascular ischemic changes and mild parenchymal volume loss. 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 There are prominent perivascular spaces in the bilateral basal ganglia and corona radiata with likely superimposed component of remote lacunar infarcts. Susceptibility over the anterior left frontal lobe suggestive of remote subarachnoid hemorrhage. Small right mastoid effusion. Electronically Signed   By: Denny Flack M.D.   On: 01/08/2024 16:54   DG Knee Complete 4 Views Right Result Date: 01/08/2024 CLINICAL DATA:  Knee pain after fall. EXAM: RIGHT KNEE - COMPLETE 4+ VIEW COMPARISON:  None Available. FINDINGS: No evidence of acute fracture or dislocation. Two screws traverse the proximal tibia. Moderate tricompartmental peripheral spurring. Multiple ossified intra-articular bodies posteriorly. No significant joint effusion. No focal soft tissue abnormalities. IMPRESSION: 1. No acute fracture or dislocation. 2. Moderate tricompartmental osteoarthritis with multiple ossified intra-articular bodies. Electronically Signed   By: Chadwick Colonel M.D.   On: 01/08/2024 15:42   DG Knee Complete 4 Views Left Result Date: 01/08/2024 CLINICAL DATA:  Knee pain after fall. EXAM: LEFT KNEE - COMPLETE 4+ VIEW COMPARISON:  None Available. FINDINGS: No acute fracture or dislocation. The joint spaces are preserved. Mild tricompartmental peripheral spurring. No erosion or focal bone abnormality. Trace quadriceps tendon enthesophyte. IMPRESSION: 1. No acute  fracture or subluxation of the left knee. 2. Mild tricompartmental osteoarthritis. Electronically Signed   By: Chadwick Colonel M.D.   On: 01/08/2024 15:41   DG Chest 1 View Result Date: 01/08/2024 CLINICAL DATA:  Unwitnessed fall. EXAM: CHEST  1 VIEW COMPARISON:  11/11/2021 FINDINGS: Lung volumes are low. Unchanged heart size. Stable mediastinal contours. Bibasilar atelectasis. No pneumothorax, large pleural effusion or pulmonary edema. On limited assessment, no evidence of acute rib fracture. IMPRESSION: Low lung volumes with bibasilar atelectasis. Electronically Signed   By: Chadwick Colonel M.D.   On: 01/08/2024 15:40   DG Hip Unilat W or Wo Pelvis 2-3 Views Left Result Date: 01/08/2024 CLINICAL DATA:  Hip pain after fall. EXAM: DG HIP (WITH OR WITHOUT PELVIS) 2-3V LEFT COMPARISON:  Pelvis radiograph from right hip series 05/27/2023 FINDINGS: No evidence of acute fracture of the pelvis or left hip. The cortical margins are intact. No hip dislocation, mild left hip degenerative change. Mild enthesopathic change about the greater trochanter. The pubic rami are intact. No pubic symphyseal or sacroiliac diastasis. Screws traverse right femoral neck with healed fracture. IMPRESSION: 1. No acute fracture or subluxation of the left hip. 2. Mild left hip degenerative change. Electronically Signed   By: Chadwick Colonel M.D.   On: 01/08/2024 15:39   CT Head Wo Contrast Result Date: 01/08/2024 CLINICAL DATA:  Found on the floor.  Trauma to the head. EXAM: CT HEAD WITHOUT CONTRAST TECHNIQUE: Contiguous axial images were obtained from the base of the skull through the vertex without intravenous contrast. RADIATION DOSE REDUCTION: This exam was performed according to the departmental dose-optimization program which includes automated exposure control, adjustment of the mA and/or kV according to patient size and/or use of iterative reconstruction technique. COMPARISON:  None Available. FINDINGS: Brain: Age related  volume loss. Chronic small-vessel ischemic changes of the white matter. No sign of recent stroke, mass, hemorrhage, hydrocephalus or extra-axial collection.  Vascular: There is atherosclerotic calcification of the major vessels at the base of the brain. Skull: Negative Sinuses/Orbits: Clear/normal Other: None IMPRESSION: No acute or traumatic finding. Age related volume loss. Chronic small-vessel ischemic changes of the white matter. Electronically Signed   By: Bettylou Brunner M.D.   On: 01/08/2024 14:22   (Echo, Carotid, EGD, Colonoscopy, ERCP)    Subjective: Patient seen and examined.  Denies any complaints.  Eager to go home.   Discharge Exam: Vitals:   02/02/24 1954 02/03/24 0432  BP: 129/62 (!) 119/58  Pulse: 90 79  Resp: 18 18  Temp: 98.8 F (37.1 C) 98.3 F (36.8 C)  SpO2: 94% 93%   Vitals:   02/02/24 1421 02/02/24 1552 02/02/24 1954 02/03/24 0432  BP: (!) 116/56  129/62 (!) 119/58  Pulse: 98  90 79  Resp: 20  18 18   Temp: 98.1 F (36.7 C)  98.8 F (37.1 C) 98.3 F (36.8 C)  TempSrc: Oral     SpO2: 94%  94% 93%  Weight:  72 kg    Height:        General: Pt is alert, awake, not in acute distress Cardiovascular: RRR, S1/S2 +, no rubs, no gallops Respiratory: CTA bilaterally, no wheezing, no rhonchi Abdominal: Soft, NT, ND, bowel sounds + Extremities: no edema, no cyanosis    The results of significant diagnostics from this hospitalization (including imaging, microbiology, ancillary and laboratory) are listed below for reference.     Microbiology: Recent Results (from the past 240 hours)  Culture, blood (Routine x 2)     Status: None (Preliminary result)   Collection Time: 02/01/24 10:34 AM   Specimen: BLOOD  Result Value Ref Range Status   Specimen Description   Final    BLOOD RIGHT ANTECUBITAL Performed at [facility], [address], [place], [place] [address]    Special Requests   Final    BOTTLES DRAWN AEROBIC AND ANAEROBIC Blood  Culture adequate volume Performed at [facility], [address] [place], [place] [address]    Culture   Final    NO GROWTH < 24 HOURS Performed at [facility], [address], [place], [place] [address]    Report Status PENDING  Incomplete  Culture, blood (Routine x 2)     Status: None (Preliminary result)   Collection Time: 02/01/24 10:39 AM   Specimen: BLOOD  Result Value Ref Range Status   Specimen Description   Final    BLOOD BLOOD RIGHT HAND Performed at [facility], [address] [place], [place] [address]    Special Requests   Final    BOTTLES DRAWN AEROBIC AND ANAEROBIC Blood Culture adequate volume Performed at [facility], [address], [place], [place] [address]    Culture   Final    NO GROWTH < 24 HOURS Performed at [facility], [address] [place], [place] [address]    Report Status PENDING  Incomplete     Labs: BNP (last 3 results) No results for input(s): "BNP" in the last 8760 hours. Basic Metabolic Panel: Recent Labs  Lab 02/01/24 1039 02/01/24 1842 02/02/24 0426 02/03/24 0429  NA 135  --  135 138  K 3.7  --  3.1* 3.9  CL 99  --  107 108  CO2 22  --  21* 21*  GLUCOSE 129*  --  106* 102*  BUN 14  --  9 11  CREATININE 0.92 0.71 0.77 0.78  CALCIUM 9.3  --  7.8* 8.1*  MG  --   --  2.1 2.2  PHOS  --   --  2.0*  --    Liver Function Tests: Recent Labs  Lab 02/01/24 1039  AST 30  ALT 29  ALKPHOS 75  BILITOT 0.6  PROT 6.9  ALBUMIN 4.1   No results for input(s): "LIPASE", "AMYLASE" in the last 168 hours. No results for input(s): "AMMONIA" in the last 168 hours. CBC: Recent Labs  Lab 02/01/24 1039 02/01/24 1842 02/02/24 0426 02/03/24 0429  WBC 9.3 9.0 7.5 5.8  NEUTROABS 6.6  --   --   --   HGB 13.7 13.1 12.0 11.8*  HCT 40.4 41.1 37.7 37.7  MCV 92.4 98.1 99.2 98.2  PLT 228 210 190 212   Cardiac Enzymes: No results for input(s): "CKTOTAL", "CKMB",  "CKMBINDEX", "TROPONINI" in the last 168 hours. BNP: Invalid input(s): "POCBNP" CBG: No results for input(s): "GLUCAP" in the last 168 hours. D-Dimer No results for input(s): "DDIMER" in the last 72 hours. Hgb A1c No results for input(s): "HGBA1C" in the last 72 hours. Lipid Profile No results for input(s): "CHOL", "HDL", "LDLCALC", "TRIG", "CHOLHDL", "LDLDIRECT" in the last 72 hours. Thyroid  function studies Recent Labs    02/01/24 1842  TSH 2.026   Anemia work up No results for input(s): "VITAMINB12", "FOLATE", "FERRITIN", "TIBC", "IRON", "RETICCTPCT" in the last 72 hours. Urinalysis    Component Value Date/Time   COLORURINE YELLOW 02/01/2024 1039   APPEARANCEUR CLOUDY (A) 02/01/2024 1039   LABSPEC 1.010 02/01/2024 1039   PHURINE 6.5 02/01/2024 1039   GLUCOSEU NEGATIVE 02/01/2024 1039   HGBUR SMALL (A) 02/01/2024 1039   BILIRUBINUR NEGATIVE 02/01/2024 1039   KETONESUR TRACE (A) 02/01/2024 1039   PROTEINUR 30 (A) 02/01/2024 1039   NITRITE POSITIVE (A) 02/01/2024 1039   LEUKOCYTESUR MODERATE (A) 02/01/2024 1039   Sepsis Labs Recent Labs  Lab 02/01/24 1039 02/01/24 1842 02/02/24 0426 02/03/24 0429  WBC 9.3 9.0 7.5 5.8   Microbiology Recent Results (from the past 240 hours)  Culture, blood (Routine x 2)     Status: None (Preliminary result)   Collection Time: 02/01/24 10:34 AM   Specimen: BLOOD  Result Value Ref Range Status   Specimen Description   Final    BLOOD RIGHT ANTECUBITAL Performed at [facility], [address] [place], [place] [address]    Special Requests   Final    BOTTLES DRAWN AEROBIC AND ANAEROBIC Blood Culture adequate volume Performed at [facility], [address] [place], [place] [address]    Culture   Final    NO GROWTH < 24 HOURS Performed at [facility], [address] [place], [place] [address]    Report Status PENDING  Incomplete  Culture, blood (Routine x 2)     Status: None  (Preliminary result)   Collection Time: 02/01/24 10:39 AM   Specimen: BLOOD  Result Value Ref Range Status   Specimen Description   Final    BLOOD BLOOD RIGHT HAND Performed at [facility], [address] [place], [place] [address]    Special Requests   Final    BOTTLES DRAWN AEROBIC AND ANAEROBIC Blood Culture adequate volume Performed at [facility], [address] [place], [place] [address]    Culture   Final    NO GROWTH < 24 HOURS Performed at [facility], [address] [place], [place] [address]    Report Status  PENDING  Incomplete     Time coordinating discharge: 40 minutes  SIGNED:   Vada Garibaldi, MD  [facility] 02/03/2024, 9:33 AM

## 2024-02-03 NOTE — TOC Transition Note (Signed)
 Transition of Care [facility]) - Discharge Note   Patient Details  Name: Shelby Chan MRN: [id number] Date of Birth: 09/25/1932  Transition of Care [facility]) CM/SW Contact:  Kathryn Parish, RN Phone Number: 02/03/2024, 11:17 AM   Clinical Narrative:    Patient ready for discharge. Patient and Daughter accept [facility] for PT. Patients daughter will transport to [facility] [place]. TOC signing off   Final next level of care: Other (comment) (Independent Living with HH PT) Barriers to Discharge: Barriers Resolved   Patient Goals and CMS Choice Patient states their goals for this hospitalization and ongoing recovery are:: [place] [facility]   Choice offered to / list presented to : Adult Children [place] ownership interest in [facility].provided to:: Parent NA    Discharge Placement                  Name of family member notified: SUZANNE Patient and family notified of of transfer: 02/03/24  Discharge Plan and Services Additional resources added to the After Visit Summary for   In-house Referral: NA Discharge Planning Services: CM Consult Post Acute Care Choice: NA          DME Arranged: N/A DME Agency: NA       HH Arranged: PT HH Agency: [facility] Date [facility] Agency Contacted: 02/03/24 Time HH Agency Contacted: 1044 Representative spoke with at [facility] Agency: Amy Hyatt  Social Drivers of Health (SDOH) Interventions SDOH Screenings   Food Insecurity: No Food Insecurity (02/01/2024)  Housing: Low Risk  (02/01/2024)  Transportation Needs: No Transportation Needs (02/01/2024)  Utilities: Not At Risk (02/01/2024)  Social Connections: Moderately Integrated (02/01/2024)  Tobacco Use: Low Risk  (02/01/2024)     Readmission Risk Interventions    01/08/2024    9:35 PM  Readmission Risk Prevention Plan  Post Dischage Appt Complete  Medication Screening Complete  Transportation Screening Complete

## 2024-02-03 NOTE — Progress Notes (Signed)
 Physical Therapy Treatment Patient Details Name: Shelby Chan MRN: [id number] DOB: Sep 20, 1932 Today's Date: 02/03/2024   History of Present Illness Shelby Chan is a 89 y.o. female admitted 02/01/24 with UTI with medical history significant of osteoarthritis, HLD, GERD comes to the hospital with complaints of fever.    PT Comments   Pt admitted with above diagnosis.  Pt currently with functional limitations due to the deficits listed below (see PT Problem List). Pt in bed when PT arrived. Pt agreeable to therapy. Pt indicated that she did not know why she was still here and was feeling much better. PT anticipates d/c to ILF today. Pt is mod I with bed mobility, S for transfers with RW cues for proper UE and AD placement for bed and commode transfers, pt static standing balance F+ no UE support, gait tasks in hallway 500 feet with RW and S. No reports of pain, SOB or dizziness. Pt reported 2 falls and inability to perform fall recovery mod I, PT suggested a lift alert system, pt verbalized understanding. Pt left in bed and all needs in place.  Pt will benefit from acute skilled PT to increase their independence and safety with mobility to allow discharge.      If plan is discharge home, recommend the following: A little help with bathing/dressing/bathroom   Can travel by private vehicle        Equipment Recommendations  None recommended by PT    Recommendations for Other Services       Precautions / Restrictions Precautions Precautions: Fall Recall of Precautions/Restrictions: Intact Restrictions Weight Bearing Restrictions Per Provider Order: No     Mobility  Bed Mobility Overal bed mobility: Modified Independent                  Transfers Overall transfer level: Needs assistance Equipment used: Rolling walker (2 wheels) Transfers: Sit to/from Stand Sit to Stand: Supervision           General transfer comment: cues for hand and RW placement     Ambulation/Gait Ambulation/Gait assistance: Supervision Gait Distance (Feet): 500 Feet Assistive device: Rolling walker (2 wheels) Gait Pattern/deviations: Step-through pattern, Decreased stride length, Wide base of support Gait velocity: decreased     General Gait Details: pt required cues for posture and proper distance from RW   Stairs             Wheelchair Mobility     Tilt Bed    Modified Rankin (Stroke Patients Only)       Balance Overall balance assessment: Needs assistance Sitting-balance support: Feet unsupported, No upper extremity supported Sitting balance-Leahy Scale: Good     Standing balance support: No upper extremity supported, During functional activity Standing balance-Leahy Scale: Fair                              Hotel manager: No apparent difficulties  Cognition Arousal: Alert Behavior During Therapy: WFL for tasks assessed/performed   PT - Cognitive impairments: No apparent impairments                         Following commands: Intact      Cueing Cueing Techniques: Verbal cues, Gestural cues  Exercises      General Comments        Pertinent Vitals/Pain Pain Assessment Pain Assessment: No/denies pain    Home Living  Prior Function            PT Goals (current goals can now be found in the care plan section) Acute Rehab PT Goals Patient Stated Goal: get well and return home PT Goal Formulation: With patient Time For Goal Achievement: 02/16/24 Potential to Achieve Goals: Good Progress towards PT goals: Progressing toward goals    Frequency    Min 3X/week      PT Plan      Co-evaluation              AM-PAC PT "6 Clicks" Mobility   Outcome Measure  Help needed turning from your back to your side while in a flat bed without using bedrails?: None Help needed moving from lying on your back to sitting on the side of  a flat bed without using bedrails?: None Help needed moving to and from a bed to a chair (including a wheelchair)?: A Little Help needed standing up from a chair using your arms (e.g., wheelchair or bedside chair)?: A Little Help needed to walk in hospital room?: A Little Help needed climbing 3-5 steps with a railing? : A Little 6 Click Score: 20    End of Session Equipment Utilized During Treatment: Gait belt Activity Tolerance: Patient tolerated treatment well Patient left: with call bell/phone within reach;in bed;with bed alarm set Nurse Communication: Mobility status PT Visit Diagnosis: History of falling (Z91.81);Difficulty in walking, not elsewhere classified (R26.2)     Time: 1610-9604 PT Time Calculation (min) (ACUTE ONLY): 24 min  Charges:    $Gait Training: 8-22 mins $Therapeutic Activity: 8-22 mins PT General Charges $$ ACUTE PT VISIT: 1 Visit                     Cary Clarks, PT Acute Rehab    Annalee Kiang 02/03/2024, 2:58 PM

## 2024-02-03 NOTE — TOC Progression Note (Signed)
 Transition of Care [facility]) - Progression Note    Patient Details  Name: Shelby Chan MRN: [id number] Date of Birth: 03-11-1933  [facility] CM/SW Contact  Kathryn Parish, RN Phone Number: 02/03/2024, 10:10 AM  Clinical Narrative:    [facility] not able to accept [place] clients.   Expected Discharge Plan: Assisted Living ([facility]) Barriers to Discharge: Continued Medical Work up  Expected Discharge Plan and Services In-house Referral: NA Discharge Planning Services: CM Consult Post Acute Care Choice: NA Living arrangements for the past 2 months: Independent Living Facility Expected Discharge Date: 02/03/24               DME Arranged: N/A DME Agency: NA       HH Arranged: NA HH Agency: NA         Social Determinants of Health (SDOH) Interventions SDOH Screenings   Food Insecurity: No Food Insecurity (02/01/2024)  Housing: Low Risk  (02/01/2024)  Transportation Needs: No Transportation Needs (02/01/2024)  Utilities: Not At Risk (02/01/2024)  Social Connections: Moderately Integrated (02/01/2024)  Tobacco Use: Low Risk  (02/01/2024)    Readmission Risk Interventions    01/08/2024    9:35 PM  Readmission Risk Prevention Plan  Post Dischage Appt Complete  Medication Screening Complete  Transportation Screening Complete

## 2024-02-06 LAB — CULTURE, BLOOD (ROUTINE X 2)
Culture: NO GROWTH
Culture: NO GROWTH
Special Requests: ADEQUATE
Special Requests: ADEQUATE

## 2024-02-10 DIAGNOSIS — N390 Urinary tract infection, site not specified: Secondary | ICD-10-CM | POA: Diagnosis not present

## 2024-02-10 DIAGNOSIS — D649 Anemia, unspecified: Secondary | ICD-10-CM | POA: Diagnosis not present

## 2024-02-10 DIAGNOSIS — N3281 Overactive bladder: Secondary | ICD-10-CM | POA: Diagnosis not present

## 2024-02-10 DIAGNOSIS — Z9181 History of falling: Secondary | ICD-10-CM | POA: Diagnosis not present

## 2024-02-12 DIAGNOSIS — Z9181 History of falling: Secondary | ICD-10-CM | POA: Diagnosis not present

## 2024-02-12 DIAGNOSIS — E559 Vitamin D deficiency, unspecified: Secondary | ICD-10-CM | POA: Diagnosis not present

## 2024-02-12 DIAGNOSIS — E785 Hyperlipidemia, unspecified: Secondary | ICD-10-CM | POA: Diagnosis not present

## 2024-02-12 DIAGNOSIS — Z7982 Long term (current) use of aspirin: Secondary | ICD-10-CM | POA: Diagnosis not present

## 2024-02-12 DIAGNOSIS — N2889 Other specified disorders of kidney and ureter: Secondary | ICD-10-CM | POA: Diagnosis not present

## 2024-02-12 DIAGNOSIS — R32 Unspecified urinary incontinence: Secondary | ICD-10-CM | POA: Diagnosis not present

## 2024-02-12 DIAGNOSIS — Z87440 Personal history of urinary (tract) infections: Secondary | ICD-10-CM | POA: Diagnosis not present

## 2024-02-12 DIAGNOSIS — N3281 Overactive bladder: Secondary | ICD-10-CM | POA: Diagnosis not present

## 2024-02-12 DIAGNOSIS — D649 Anemia, unspecified: Secondary | ICD-10-CM | POA: Diagnosis not present

## 2024-02-12 DIAGNOSIS — I10 Essential (primary) hypertension: Secondary | ICD-10-CM | POA: Diagnosis not present

## 2024-02-12 DIAGNOSIS — N390 Urinary tract infection, site not specified: Secondary | ICD-10-CM | POA: Diagnosis not present

## 2024-02-17 ENCOUNTER — Telehealth: Payer: Self-pay

## 2024-02-17 DIAGNOSIS — R053 Chronic cough: Secondary | ICD-10-CM | POA: Insufficient documentation

## 2024-02-17 DIAGNOSIS — Z9181 History of falling: Secondary | ICD-10-CM | POA: Insufficient documentation

## 2024-02-17 DIAGNOSIS — M8580 Other specified disorders of bone density and structure, unspecified site: Secondary | ICD-10-CM | POA: Insufficient documentation

## 2024-02-17 DIAGNOSIS — R4189 Other symptoms and signs involving cognitive functions and awareness: Secondary | ICD-10-CM | POA: Insufficient documentation

## 2024-02-17 DIAGNOSIS — G5602 Carpal tunnel syndrome, left upper limb: Secondary | ICD-10-CM | POA: Insufficient documentation

## 2024-02-17 NOTE — Telephone Encounter (Signed)
-----   Message from Lauretta Ponto sent at 02/17/2024  1:26 PM EDT ----- Patient with cognitive impairment who is scheduled for new patient visit tomorrow 02/18/24. Please notify SNF / family that patient must be accompanied by POA / legal guardian at check in or appointment will be rescheduled.

## 2024-02-17 NOTE — Progress Notes (Unsigned)
 Name: Tasneem Cormier Duffell DOB: [birth year] MRN: [id number]  History of Present Illness: Ms. Eckford is a 89 y.o. female who presents today as a new patient at [facility]. All available relevant medical records have been reviewed. She resides at a SNF and is accompanied by her daughter Ottie Blonder, who assists with providing history due to patient's cognitive impairment.  Recent history:  > 01/08/2024 - 01/10/2024: Admitted for acute metabolic encephalopathy possibly due to COVID and UTI. Had a mild leukocytosis with UA concerning for UTI.  Urine culture with GNR. Urine culture positive for E. Coli and Aerococcus. Treated with cephalosporins.  > 02/01/2024 - 02/03/2024: Admitted for acute UTI, suspected left-sided pyelonephritis. CT scan of the abdomen pelvis was done, shows significant inflammatory changes on the left without hydronephrosis or bladder retention [and no stones]. Postvoid residuals, 270 mL, 130 mL.  Renal functions normal. Responded well to Rocephin , will continue Keflex  for 10 additional days.  Treating prolonged therapy due to recurrence. And was prescribed Flomax .  She was instructed to do double voiding to avoid postvoid residuals.  Today: She reports that she has not been taking the Flomax  which was prescribed upon recent hospital discharge.   She takes Myrbetriq (Mirabegron) 25 mg daily as prescribed by her PCP about 2 months ago for OAB with urge incontinence. She previously tried [place] but that was too expensive.  Currently she is having urinary slow weak urinary stream and hesitancy. Denies dysuria.   Medications: Current Outpatient Medications  Medication Sig Dispense Refill   estradiol (ESTRACE) 0.1 MG/GM vaginal cream Discard plastic applicator. Insert a blueberry size amount (approximately 1 gram) of cream on fingertip inside vagina at bedtime every night for 1 week then every other night. For long term use. 30 g 3   acetaminophen  (TYLENOL ) 650 MG CR  tablet Take 1,300 mg by mouth every 8 (eight) hours as needed for pain.     aspirin  EC 81 MG tablet Take 1 tablet (81 mg total) by mouth 2 (two) times daily. Continue taking once a day after a month (Patient taking differently: Take 81 mg by mouth daily.) 60 tablet 0   Calcium Carbonate-Vitamin D  (CALTRATE 600+D PO) Take 1 tablet by mouth daily.     cetirizine (ZYRTEC) 10 MG tablet Take 10 mg by mouth daily.     Coenzyme Q10 100 MG capsule Take 100 mg by mouth daily.     Multiple Vitamins-Minerals (CENTRUM SILVER WOMEN 50+) TABS Take 1 tablet by mouth daily with breakfast.     No current facility-administered medications for this visit.    Allergies: No Known Allergies  Past Medical History:  Diagnosis Date   Arthritis    Complication of anesthesia    GERD (gastroesophageal reflux disease)    High cholesterol    PONV (postoperative nausea and vomiting)    Past Surgical History:  Procedure Laterality Date   CHOLECYSTECTOMY     GLUTEUS MINIMUS REPAIR Right 09/24/2023   Procedure: RIGHT GLUTEUS MEDIUS REPAIR WITH COLLAGEN PATCH AUGMENTATION;  Surgeon: Wilhelmenia Harada, MD;  Location: [facility] OR;  Service: Orthopedics;  Laterality: Right;   HIP PINNING,CANNULATED Right 11/07/2021   Procedure: RIGHT HIP PERCUTANEOUS SCREW FIXATION;  Surgeon: Wilhelmenia Harada, MD;  Location: [facility] OR;  Service: Orthopedics;  Laterality: Right;   Right leg fracture s/p repair with 2 pins placed     Family History  Problem Relation Age of Onset   Heart disease Mother    Social History   Socioeconomic History  Marital status: Widowed    Spouse name: Not on file   Number of children: Not on file   Years of education: Not on file   Highest education level: Not on file  Occupational History   Not on file  Tobacco Use   Smoking status: Never   Smokeless tobacco: Never  Vaping Use   Vaping status: Never Used  Substance and Sexual Activity   Alcohol use: Never   Drug use: Never   Sexual activity: Not on file   Other Topics Concern   Not on file  Social History Narrative   Not on file   Social Drivers of Health   Financial Resource Strain: Not on file  Food Insecurity: No Food Insecurity (02/01/2024)   Hunger Vital Sign    Worried About Running Out of Food in the Last Year: Never true    Ran Out of Food in the Last Year: Never true  Transportation Needs: No Transportation Needs (02/01/2024)   PRAPARE - Administrator, Civil Service (Medical): No    Lack of Transportation (Non-Medical): No  Physical Activity: Not on file  Stress: Not on file  Social Connections: Moderately Integrated (02/01/2024)   Social Connection and Isolation Panel    Frequency of Communication with Friends and Family: More than three times a week    Frequency of Social Gatherings with Friends and Family: More than three times a week    Attends Religious Services: More than 4 times per year    Active Member of [employer] or Organizations: No    Attends Banker Meetings: 1 to 4 times per year    Marital Status: Widowed  Intimate Partner Violence: Not At Risk (02/01/2024)   Humiliation, Afraid, Rape, and Kick questionnaire    Fear of Current or Ex-Partner: No    Emotionally Abused: No    Physically Abused: No    Sexually Abused: No    SUBJECTIVE  Review of Systems Constitutional: Patient denies any unintentional weight loss or change in strength lntegumentary: Patient denies any rashes or pruritus Cardiovascular: Patient denies chest pain or syncope Respiratory: Patient denies shortness of breath Gastrointestinal: Patient denies nausea, vomiting, constipation, or diarrhea  Musculoskeletal: Patient denies muscle cramps or weakness Neurologic: Patient denies convulsions or seizures Allergic/Immunologic: Patient denies recent allergic reaction(s) Hematologic/Lymphatic: Patient denies bleeding tendencies Endocrine: Patient denies heat/cold intolerance  GU: As per HPI.  OBJECTIVE Vitals:    02/18/24 1400  BP: 116/72  Pulse: (!) 101   There is no height or weight on file to calculate BMI.  Physical Examination Constitutional: No obvious distress; patient is non-toxic appearing  Cardiovascular: No visible lower extremity edema.  Respiratory: The patient does not have audible wheezing/stridor; respirations do not appear labored  Gastrointestinal: Abdomen non-distended Musculoskeletal: Normal ROM of UEs  Skin: No obvious rashes/open sores  Neurologic: CN 2-12 grossly intact Psychiatric: Answered questions appropriately with normal affect  Hematologic/Lymphatic/Immunologic: No obvious bruises or sites of spontaneous bleeding  UA: negative  PVR: 287 ml  ASSESSMENT History of UTI - Plan: Urinalysis, Routine w reflex microscopic, BLADDER SCAN AMB NON-IMAGING, estradiol (ESTRACE) 0.1 MG/GM vaginal cream  Pyelonephritis - Plan: Urinalysis, Routine w reflex microscopic, BLADDER SCAN AMB NON-IMAGING, estradiol (ESTRACE) 0.1 MG/GM vaginal cream  Hospital discharge follow-up - Plan: Urinalysis, Routine w reflex microscopic, BLADDER SCAN AMB NON-IMAGING  Cognitive deficits - Plan: Urinalysis, Routine w reflex microscopic, BLADDER SCAN AMB NON-IMAGING  Atrophic vaginitis - Plan: estradiol (ESTRACE) 0.1 MG/GM vaginal cream  OAB (  overactive bladder) - Plan: estradiol (ESTRACE) 0.1 MG/GM vaginal cream  Urge incontinence  Incomplete bladder emptying:  We discussed possible etiologies for incomplete bladder emptying such as: neurogenic bladder, constipation, anticholinergic medication use, high-tone pelvic floor dysfunction, voiding dyssynergia. Reviewed risks related to incomplete bladder emptying including but not limited to: bladder discomfort I pain, UTls, pyelonephritis, urosepsis, kidney damage/failure, decreased kidney function requiring dialysis, early mortality.  Advised discontinuation of Myrbetriq (Mirabegron) since incomplete bladder emptying / urinary retention are known  potential side effects of that medication.   OK not to take Flomax .   Will recheck PVR at nurse visit in about a week. Once she is emptying her bladder adequately we will then proceed with working on addressing her OAB with urge incontinence.  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 Discussed rationale and potential risks/benefits. Patient agreed. Handout provided about additional recommendations for UTI prevention.  Patient and daughter verbalized understanding of and agreement with current plan. All questions were answered.   PLAN Advised the following: Discontinue Myrbetriq (Mirabegron). Discontinue Flomax . Start topical vaginal estrogen cream as prescribed.  Maintain adequate hydration daily. Return in about 1 week (around 02/25/2024) for nurse visit for PVR check, f/u with NP in about 3 weeks for incomplete bladder emptying.  Orders Placed This Encounter  Procedures   Urinalysis, Routine w reflex microscopic   BLADDER SCAN AMB NON-IMAGING    It has been explained that the patient is to follow regularly with their PCP in addition to all other providers involved in their care and to follow instructions provided by these respective offices. Patient advised to contact urology clinic if any urologic-pertaining questions, concerns, new symptoms or problems arise in the interim period.  Patient Instructions  UTI prevention / management:  UTI symptoms may include:  - Pain / burning / discomfort when urinating - Recent increase in urinary urgency (how quickly you feel like you need to rush to the bathroom) - Recent increase in urinary frequency (how often you are urinating) - Fever - Acute mental status change / confusion - Fatigue / Feeling tired - Weakness - Note: Urine color, clarity, and odor are not considered to be clinically significant indicators of UTI  and do not warrant urine testing unless patient is also experiencing UTI symptoms such as those listed above.  Difference between Urinalysis (urine dipstick test) and Urine culture / Why urine culture often needed to determine appropriate diagnosis and treatment of urologic symptoms: > Urinalysis (urine dipstick test): A quick office test used as an indicator to determine whether or not further testing is necessary (such as a urine culture, urine microscopy, etc.) The urinalysis cannot differentiate a true bacterial UTI or give a definitive diagnosis for the findings.  > Urine culture: May be performed based on the findings of a urinalysis to evaluate for UTI. Grows out on a petri dish for 48-72 hours. Provides important information about: whether or not bacterial growth is present and if so: what the predominant bacteria is which antibiotics will work best against that bacteria That information is important so that we can diagnose and treat patients appropriately as there are other conditions which may mimic UTls which must not be missed (such as cancer, interstitial cystitis, stones, etc.). Assists us  with antibiotic stewardship to minimize patient's risk for developing antibiotic resistance (getting to a point where no antibiotics work anymore).  Options when UTI symptoms occur: 1. Call [facility] and request to speak with triage nurse (phone # [phone], select option 3). In accordance with clinic guidelines the nurse will determine next steps based on patient-reported symptoms, which may include: same-day lab visit to provide urine specimen, recommendation to schedule Urology office visit appointment for further evaluation, recommendation to proceed to ER, etc. 2. Call your Primary Care Provider (PCP) office to request urgent / same-day visit. Be sure to request for urine culture to be ordered and have results faxed to Urology (fax # [phone]).  3. Go to urgent  care. Be sure to request for urine culture to be ordered and have results faxed to Urology (fax # [phone]).   For bladder pain/ burning with urination: - Can take over-the-counter Pyridium (phenazopyridine; commonly known under the AZO brand) for a few days as needed. Limit use to no more than 3 days consecutively due to risk for methemoglobinemia, liver function issues, and bone health damage with long term use of Pyridium. - Alternative: Prescription urinary analgesics (such as Uribel, Urogesic blue, Urelle, Uro-MP). Often expensive / poorly covered by insurance unfortunately.  Options / recommendations for UTI prevention: - Topical vaginal estrogen for vaginal atrophy (aka Genitourinary Syndrome of Menopause (GSM)). - Adequate daily fluid intake to flush out the urinary tract. - Go to the bathroom to urinate every 4-6 hours while awake to minimize urinary stasis / bacterial overgrowth in the bladder. - Proanthocyanidin (PAC) supplement 36 mg daily; must be soluble (insoluble form of PAC will be ineffective). Recommended brand: Ellura. This is an over-the-counter supplement (often must be found/ purchased online) supplement derived from cranberries with concentrated active component: Proanthocyanidin (PAC) 36 mg daily. Decreases bacterial adherence to bladder lining.  - D-mannose powder (2 grams daily). This is an over-the-counter supplement which decreases bacterial adherence to bladder lining (it is a sugar that inhibits bacterial adherence to urothelial cells by binding to the pili of enteric bacteria). Take as per manufacturer recommendation. Can be used as an alternative or in addition to the concentrated cranberry supplement.  - Vitamin C supplement to acidify urine to minimize bacterial growth.  - Probiotic to maintain healthy vaginal microbiome to suppress bacteria at urethral opening. Brand recommendations: Estill Hemming (includes probiotic & D-mannose ), Feminine Balance (highest  concentration of lactobacillus) or Hyperbiotic Pro 15.  Note for patients with diabetes:  - Be aware that D-mannose contains sugar.  Note for patients with interstitial cystitis (IC):  - Patients with IC should typically avoid cranberry/ PAC supplements and Vitamin C supplements due to their acidity, which may exacerbate IC-related bladder pain. - Symptoms of true bacterial UTI can overlap / mimic symptoms of an IC flare up. Antibiotic use is NOT indicated for IC flare ups. Urine culture needed prior to antibiotic treatment for IC patients. The goal is to minimize your risk for developing antibiotic-resistant bacteria.    Vaginal atrophy I Genitourinary syndrome of menopause (GSM):  What it is: Changes in the vaginal environment (including the vulva and urethra) including: Thinning of the epithelium (skin/ mucosa surface) Can contribute to urinary urgency and frequency Can contribute to dryness, itching, irritation of the vulvar and vaginal tissue Can contribute to pain with intercourse Can contribute to physical changes of the labia, vulva, and vagina such as: Narrowing of the vaginal opening Decreased vaginal length Loss of labial architecture Labial adhesions Pale color of vulvovaginal tissue Loss of pubic hair Allows bacteria to become adherent Results in increased risk for urinary tract infection (UTI) due to bacterial overgrowth and migration  up the urethra into the bladder Change in vaginal pH (acid/ base balance) Allows for alteration / disruption of the normal bacterial flora / microbiome Results in increased risk for urinary tract infection (UTI) due to bacterial overgrowth  Treatment options: Over-the-counter lubricants (see list below). Prescription vaginal estrogen replacement. Options: Topical vaginal estrogen (estradiol) cream: (Estrace, Premarin, compounded) Apply as directed Estring vaginal ring Exchanged every 3 months (either at home or in office by  provider) Vagifem vaginal tablet Inserted nightly for 2 weeks then twice a week (long term) lntrarosa vaginal suppository Vaginal DHEA: converts to estrogen in vaginal tissue without systemic effect Inserted nightly (long term) 3. Vaginal laser therapy (Mona Edwina Gram touch) Performed in 3 treatments each 6 weeks apart (available in our [place] office). Can feel like a sunburn for 3-4 days after each treatment until new skin heals in. Usually not covered by insurance. Estimated cost is $1500 for all 3 sessions.  FYI regarding prescription vaginal estrogen treatment options: - All topical vaginal estrogen replacement options are equivalent in terms of efficacy. - Topical vaginal estrogen replacement will take about 3 months to be effective. - OK to have sex with any of the topical vaginal estrogen replacement options. - Topical vaginal estrogen replacement may sting/burn initially due to severe dryness, which will improve with ongoing treatment. - Studies have demonstrated negligible systemic absorption of low-dose intravaginal estrogen cream therefore the risk for cancer development or recurrence with this medication is minimal.  Genitourinary Syndrome of Menopause: AUA/SUFU/AUGS Guideline (2025) [place].at  Topical vaginal estrogen cream safe to use with breast cancer history [email or web address]  Topical vaginal estrogen cream safe to use with blood clot history [email or web address]   Lubricants and Moisturizers for Treating Genitourinary Syndrome of Menopause and Vulvovaginal Atrophy Treatment Comments I Available Products   Lubricants   Water-based Ingredients:  Deionized water, glycerin, propylene glycol; latex safe; rare irritation; dry out with extended sexual activity Astroglide, Good Clean Love, K-Y Jelly, Natural, Organic, Pink, Sliquid, Sylk, Yes    Oil Based Ingredients: avocado, olive, peanut, corn; latex safe; can be used with silicone products; staining; safe (unless peanut allergy); non-irritating Coconut oil, vegetable oil, vitamin E oil  Silicone-Based Ingredients: Silicone polymers; staining; typically nonirritating, long lasting; waterproof; should not be used with silicone dilators, sexual toys, or gynecologic products Astroglide X, Oceanus Ultra Pure, Pink Silicone, Pjur Eros, Replens Silky Smooth, Silicone Premium JO, SKYN, Uberlube, [employer] Based Minimize harm to sperm motility; designed for couples trying to conceive:  Astroglide TTC, Conceive Plus, PreSeed, Yes Baby  Fertility Friendly Minimize harm to sperm motility; designed for couples trying to conceive:  Astroglide, TTC, Conceive Plus, PreSeed, Yes Baby  Vaginal Moisturizers   Vaginal Moisturizers For maintenance use 1 to 3 times weekly; can benefit women with dryness, chafing with AOL, and recurrent vaginal infections irrespective of sexual activity timing Balance Active Menopause Vaginal Moisturizing Lubricant, Canesintima Intimate Moisturizer, Replens, Rephresh, Sylk Natural Intimate Moisturizer, Yes Vaginal Moisturizer  Hybrids Properties of both water and silicone-based products (combination of a vaginal lubricant and moisturizer); Non-irritating; good option for women with allergies and sensitivities Lubrigyn, Luvena  Suppositories Hyaluronic acid to retain moisture Revaree  Vulvar Soothing Creams/Oils    Medicated Creams Pain and burn relief; Ingredients: 4% Lidocaine , Aloe Vera gel Releveum (Desert Wainwright)  Non-Medicated Creams For anti-itch and moisture/maintenance; Ingredients: Coconut oil, Avocado oil, Shea Butter, Olive oil, Vitamin E  Vajuvenate, Vmagic  Oils for moisture/maintenance: Coconut oil, Vitamin E oil, Emu oil  '  Overactive bladder (OAB) overview for patients:  Symptoms may include: urinary urgency (gotta go feeling) urinary frequency (voiding >8 times per day) night time urination (nocturia) urge incontinence of urine (UUI)  While we do not know the exact etiology of OAB, several treatment options exist including:  Behavioral therapy: Reducing fluid intake Decreasing bladder stimulants (such as caffeine) and irritants (such as acidic food, spicy foods, alcohol) Urge suppression strategies Bladder retraining via timed voiding  Pelvic floor physical therapy  Medication(s) - can use one or both of the drug classes below. Anticholinergic / antimuscarinic medications:  Mechanism of action: Activate M3 receptors to reduce detrusor stimulation and increase bladder capacity   (parasympathetic nervous system). Effect: Relaxes the bladder to decrease overactivity, increase bladder storage capacity, and increase time between voids. Onset: Slow acting (may take 8-12 weeks to determine efficacy). Medications include: Vesicare (Solifenacin), Ditropan (Oxybutynin), Detrol (Tolterodine), Toviaz (Fesoterodine), Sanctura (Trospium), Urispas (Flavoxate), Enablex (Darifenacin), Bentyl (Dicyclomine), Levsin (Hyoscyamine ). Potential side effects include but are not limited to: Dry eyes, dry mouth, constipation, cognitive impairment, dementia risk with long term use, and urinary retention/ incomplete bladder emptying. Insurance companies generally prefer for patients to try 1-2 anticholinergic / antimuscarinic medications first due to low cost. Some exceptions are made based on patient-specific comorbidities / risk factors. Beta-3 agonist medications: Mechanism of action: Stimulates selective B3 adrenergic receptors to cause smooth muscle bladder relaxation (sympathetic nervous system). Effect: Relaxes  the bladder to decrease overactivity, increase bladder storage capacity, and increase time between voids. Onset: Slow acting (may take 8-12 weeks to determine efficacy). Medications include: Myrbetriq (Mirabegron) and Vibegron Seferino Dade). Potential side effects include but are not limited to: urinary retention / incomplete bladder emptying and elevated blood pressure (more likely to occur in individuals with pre-existing uncontrolled hypertension). These medications tend to be more expensive than the anticholinergic / antimuscarinic medications.   For patients with refractory OAB (if the above treatment options have been unsuccessful): Posterior tibial nerve stimulation (PTNS). Small acupuncture-type needle inserted near ankle with electric current to stimulate bladder via posterior tibial nerve pathway. Initially requires 12 weekly in-office treatments lasting 30 minutes each; followed by monthly in-office treatments lasting 30 minutes each for 1 year.  Bladder Botox injections. How it is done: Typically done via in-office cystoscopy; sometimes done in the OR depending on the situation. The bladder is numbed with lidocaine  instilled via a catheter. Then the urologist injects Botox into the bladder muscle wall in about 20 locations. Causes local paralysis of the bladder muscle at the injection sites to reduce bladder muscle overactivity / spasms. The effect lasts for approximately 6 months and cannot be reversed once performed. Risks may included but are not limited to: infection, incomplete bladder emptying/ urinary retention, short term need for self-catheterization or indwelling catheter, and need for repeat therapy. There is a 5-12% chance of needing to catheterize with Botox - that usually resolves in a few months as the Botox wears off. Typically Botox injections would need to be repeated every 3-12 months since this is not a permanent therapy.  Sacral neuromodulation trial (Medtronic lnterStim  or Axonics implant). Sacral neuromodulation is FDA-approved for uncontrolled urinary urgency, urinary frequency, urinary urge incontinence, non-obstructive urinary retention, or fecal incontinence. It is not FDA-approved as a treatment for pain. The goal of this therapy is at least a 50% improvement in symptoms. It is NOT realistic to expect a 100% cure. This is a a 2-step outpatient procedure. After a successful test period, a permanent wire and generator  are placed in the OR. We discussed the risk of infection. We reviewed the fact that about 30% of patients fail the test phase and are not candidates for permanent generator placement. During the 1-2 week trial phase, symptoms are documented by the patient to determine response. If patient gets at least a 50% improvement in symptoms, they may then proceed with Step 2. Step 1: Trial lead placement. Per physician discretion, may done one of two ways: Percutaneous nerve evaluation (PNE) in the [facility] urology office. Performed by urologist under local anesthesia (numbing the area with lidocaine ) using a spinal needle for placement of test wire, which usually stays in place for 5-7 days to determine therapy response. Test lead placement in OR under anesthesia. Usually stays in place 2 weeks to determine therapy response. > Step 2: Permanent implantation of sacral neuromodulation device, which is performed in the OR.  Sacral neuromodulation implants: All are conditionally MRI safe. Manufacturer: Medtronic Website: [email or web address].gl therapy/right-for-you.[email or web address] Options: lnterStim X: Non-rechargeable. The battery lasts 10 years on average. lnterStim Micro: Rechargeable. The battery lasts 15 years on average and must be charged routinely. Approximately 50% smaller implant than lnterStim X implant.  Manufacturer: Axonics Website:  Findrealrelief.axonics.com Options: Non-rechargeable (Axonics F15): The battery lasts 15 years on average. Rechargeable (Axonics R20): The battery lasts 20 years on average and must be charged in office for about 1 hour every 6-10 months on average. Approximately 50% smaller implant than Axonics non-rechargeable implant.  Note: Generally the rechargeable devices are only advised for very small or thin patients who may not have sufficient adipose tissue to comfortably overlay the implanted device.  Suprapubic catheter (SP tube) placement. Only done in severely refractory OAB when all other options have failed or are not a viable treatment choice depending on patient factors. Involves placement of a catheter through the lower abdomen into the bladder to continuously drain the bladder into an external collection bag, which patient can then empty at their convenience every few hours. Done via an outpatient surgical procedure in the OR under anesthesia. Risks may included but are not limited to: surgical site pain, infections, skin irritation / breakdown, chronic bacteriuria, symptomatic UTls. The SP tube must stay in place continuously. This is a reversible procedure however - the insertion site will close if catheter is removed for more than a few hours. The SP tube must be exchanged routinely every 4 weeks to prevent the catheter from becoming clogged with sediment. SP tube exchanges are typically performed at a urology nurse visit or by a home health nurse.   Electronically signed by:  Lauretta Ponto, MSN, FNP-C, CUNP 02/18/2024 3:16 PM

## 2024-02-17 NOTE — Telephone Encounter (Signed)
 Patient's daughter Ottie Blonder voiced she will be attending mom at office visited tomorrow.

## 2024-02-18 ENCOUNTER — Ambulatory Visit: Admitting: Urology

## 2024-02-18 ENCOUNTER — Encounter: Payer: Self-pay | Admitting: Urology

## 2024-02-18 VITALS — BP 116/72 | HR 101

## 2024-02-18 DIAGNOSIS — R4189 Other symptoms and signs involving cognitive functions and awareness: Secondary | ICD-10-CM

## 2024-02-18 DIAGNOSIS — N952 Postmenopausal atrophic vaginitis: Secondary | ICD-10-CM

## 2024-02-18 DIAGNOSIS — N3941 Urge incontinence: Secondary | ICD-10-CM

## 2024-02-18 DIAGNOSIS — N12 Tubulo-interstitial nephritis, not specified as acute or chronic: Secondary | ICD-10-CM | POA: Diagnosis not present

## 2024-02-18 DIAGNOSIS — Z09 Encounter for follow-up examination after completed treatment for conditions other than malignant neoplasm: Secondary | ICD-10-CM | POA: Diagnosis not present

## 2024-02-18 DIAGNOSIS — Z87440 Personal history of urinary (tract) infections: Secondary | ICD-10-CM | POA: Diagnosis not present

## 2024-02-18 DIAGNOSIS — N3281 Overactive bladder: Secondary | ICD-10-CM | POA: Diagnosis not present

## 2024-02-18 LAB — URINALYSIS, ROUTINE W REFLEX MICROSCOPIC
Bilirubin, UA: NEGATIVE
Glucose, UA: NEGATIVE
Ketones, UA: NEGATIVE
Leukocytes,UA: NEGATIVE
Nitrite, UA: NEGATIVE
Protein,UA: NEGATIVE
RBC, UA: NEGATIVE
Specific Gravity, UA: 1.02 (ref 1.005–1.030)
Urobilinogen, Ur: 0.2 mg/dL (ref 0.2–1.0)
pH, UA: 6 (ref 5.0–7.5)

## 2024-02-18 LAB — BLADDER SCAN AMB NON-IMAGING: Scan Result: 287

## 2024-02-18 MED ORDER — ESTRADIOL 0.1 MG/GM VA CREA: TOPICAL_CREAM | VAGINAL | 3 refills | Status: DC

## 2024-02-18 NOTE — Patient Instructions (Signed)
 UTI prevention / management:  UTI symptoms may include:  - Pain / burning / discomfort when urinating - Recent increase in urinary urgency (how quickly you feel like you need to rush to the bathroom) - Recent increase in urinary frequency (how often you are urinating) - Fever - Acute mental status change / confusion - Fatigue / Feeling tired - Weakness - Note: Urine color, clarity, and odor are not considered to be clinically significant indicators of UTI and do not warrant urine testing unless patient is also experiencing UTI symptoms such as those listed above.  Difference between Urinalysis (urine dipstick test) and Urine culture / Why urine culture often needed to determine appropriate diagnosis and treatment of urologic symptoms: > Urinalysis (urine dipstick test): A quick office test used as an indicator to determine whether or not further testing is necessary (such as a urine culture, urine microscopy, etc.) The urinalysis cannot differentiate a true bacterial UTI or give a definitive diagnosis for the findings.  > Urine culture: May be performed based on the findings of a urinalysis to evaluate for UTI. Grows out on a petri dish for 48-72 hours. Provides important information about: whether or not bacterial growth is present and if so: what the predominant bacteria is which antibiotics will work best against that bacteria That information is important so that we can diagnose and treat patients appropriately as there are other conditions which may mimic UTls which must not be missed (such as cancer, interstitial cystitis, stones, etc.). Assists us  with antibiotic stewardship to minimize patient's risk for developing antibiotic resistance (getting to a point where no antibiotics work anymore).  Options when UTI symptoms occur: 1. Call [facility] [place] and request to speak with triage nurse (phone # [phone], select option 3). In accordance with clinic guidelines  the nurse will determine next steps based on patient-reported symptoms, which may include: same-day lab visit to provide urine specimen, recommendation to schedule Urology office visit appointment for further evaluation, recommendation to proceed to ER, etc. 2. Call your Primary Care Provider (PCP) office to request urgent / same-day visit. Be sure to request for urine culture to be ordered and have results faxed to Urology (fax # [phone]).  3. Go to urgent care. Be sure to request for urine culture to be ordered and have results faxed to Urology (fax # [phone]).   For bladder pain/ burning with urination: - Can take over-the-counter Pyridium (phenazopyridine; commonly known under the AZO brand) for a few days as needed. Limit use to no more than 3 days consecutively due to risk for methemoglobinemia, liver function issues, and bone health damage with long term use of Pyridium. - Alternative: Prescription urinary analgesics (such as Uribel, Urogesic blue, Urelle, Uro-MP). Often expensive / poorly covered by insurance unfortunately.  Options / recommendations for UTI prevention: - Topical vaginal estrogen for vaginal atrophy (aka Genitourinary Syndrome of Menopause (GSM)). - Adequate daily fluid intake to flush out the urinary tract. - Go to the bathroom to urinate every 4-6 hours while awake to minimize urinary stasis / bacterial overgrowth in the bladder. - Proanthocyanidin (PAC) supplement 36 mg daily; must be soluble (insoluble form of PAC will be ineffective). Recommended brand: Ellura. This is an over-the-counter supplement (often must be found/ purchased online) supplement derived from cranberries with concentrated active component: Proanthocyanidin (PAC) 36 mg daily. Decreases bacterial adherence to bladder lining.  - D-mannose powder (2 grams daily). This is an over-the-counter supplement which decreases bacterial adherence to bladder lining (it  is a sugar that inhibits bacterial  adherence to urothelial cells by binding to the pili of enteric bacteria). Take as per manufacturer recommendation. Can be used as an alternative or in addition to the concentrated cranberry supplement.  - Vitamin C supplement to acidify urine to minimize bacterial growth.  - Probiotic to maintain healthy vaginal microbiome to suppress bacteria at urethral opening. Brand recommendations: Estill Hemming (includes probiotic & D-mannose ), Feminine Balance (highest concentration of lactobacillus) or Hyperbiotic Pro 15.  Note for patients with diabetes:  - Be aware that D-mannose contains sugar.  Note for patients with interstitial cystitis (IC):  - Patients with IC should typically avoid cranberry/ PAC supplements and Vitamin C supplements due to their acidity, which may exacerbate IC-related bladder pain. - Symptoms of true bacterial UTI can overlap / mimic symptoms of an IC flare up. Antibiotic use is NOT indicated for IC flare ups. Urine culture needed prior to antibiotic treatment for IC patients. The goal is to minimize your risk for developing antibiotic-resistant bacteria.    Vaginal atrophy I Genitourinary syndrome of menopause (GSM):  What it is: Changes in the vaginal environment (including the vulva and urethra) including: Thinning of the epithelium (skin/ mucosa surface) Can contribute to urinary urgency and frequency Can contribute to dryness, itching, irritation of the vulvar and vaginal tissue Can contribute to pain with intercourse Can contribute to physical changes of the labia, vulva, and vagina such as: Narrowing of the vaginal opening Decreased vaginal length Loss of labial architecture Labial adhesions Pale color of vulvovaginal tissue Loss of pubic hair Allows bacteria to become adherent Results in increased risk for urinary tract infection (UTI) due to bacterial overgrowth and migration up the urethra into the bladder Change in vaginal pH (acid/ base balance) Allows for  alteration / disruption of the normal bacterial flora / microbiome Results in increased risk for urinary tract infection (UTI) due to bacterial overgrowth  Treatment options: Over-the-counter lubricants (see list below). Prescription vaginal estrogen replacement. Options: Topical vaginal estrogen (estradiol) cream: (Estrace, Premarin, compounded) Apply as directed Estring vaginal ring Exchanged every 3 months (either at home or in office by provider) Vagifem vaginal tablet Inserted nightly for 2 weeks then twice a week (long term) lntrarosa vaginal suppository Vaginal DHEA: converts to estrogen in vaginal tissue without systemic effect Inserted nightly (long term) 3. Vaginal laser therapy (Mona Edwina Gram touch) Performed in 3 treatments each 6 weeks apart (available in our [place] office). Can feel like a sunburn for 3-4 days after each treatment until new skin heals in. Usually not covered by insurance. Estimated cost is $1500 for all 3 sessions.  FYI regarding prescription vaginal estrogen treatment options: - All topical vaginal estrogen replacement options are equivalent in terms of efficacy. - Topical vaginal estrogen replacement will take about 3 months to be effective. - OK to have sex with any of the topical vaginal estrogen replacement options. - Topical vaginal estrogen replacement may sting/burn initially due to severe dryness, which will improve with ongoing treatment. - Studies have demonstrated negligible systemic absorption of low-dose intravaginal estrogen cream therefore the risk for cancer development or recurrence with this medication is minimal.  Genitourinary Syndrome of Menopause: AUA/SUFU/AUGS Guideline (2025) [place]Info.at  Topical vaginal estrogen cream safe to use with breast cancer  history WomenInsider.com.ee  Topical vaginal estrogen cream safe to use with blood clot history [email or web address]   Lubricants and Moisturizers for Treating Genitourinary Syndrome of Menopause and Vulvovaginal Atrophy Treatment Comments I Available Products   Lubricants   Water-based  Ingredients: Deionized water, glycerin, propylene glycol; latex safe; rare irritation; dry out with extended sexual activity Astroglide, Good Clean Love, K-Y Jelly, Natural, Organic, Pink, Sliquid, Sylk, Yes    Oil Based Ingredients: avocado, olive, peanut, corn; latex safe; can be used with silicone products; staining; safe (unless peanut allergy); non-irritating Coconut oil, vegetable oil, vitamin E oil  Silicone-Based Ingredients: Silicone polymers; staining; typically nonirritating, long lasting; waterproof; should not be used with silicone dilators, sexual toys, or gynecologic products Astroglide X, Oceanus Ultra Pure, Pink Silicone, Pjur Eros, Replens Silky Smooth, Silicone Premium JO, SKYN, Uberlube, [employer] Based Minimize harm to sperm motility; designed for couples trying to conceive:  Astroglide TTC, Conceive Plus, PreSeed, Yes Baby  Fertility Friendly Minimize harm to sperm motility; designed for couples trying to conceive:  Astroglide, TTC, Conceive Plus, PreSeed, Yes Baby  Vaginal Moisturizers   Vaginal Moisturizers For maintenance use 1 to 3 times weekly; can benefit women with dryness, chafing with AOL, and recurrent vaginal infections irrespective of sexual activity timing Balance Active Menopause Vaginal Moisturizing Lubricant, Canesintima Intimate Moisturizer, Replens, Rephresh, Sylk Natural Intimate Moisturizer, Yes Vaginal  Moisturizer  Hybrids Properties of both water and silicone-based products (combination of a vaginal lubricant and moisturizer); Non-irritating; good option for women with allergies and sensitivities Lubrigyn, Luvena  Suppositories Hyaluronic acid to retain moisture Revaree  Vulvar Soothing Creams/Oils    Medicated Creams Pain and burn relief; Ingredients: 4% Lidocaine , Aloe Vera gel Releveum (Desert Braden)  Non-Medicated Creams For anti-itch and moisture/maintenance; Ingredients: Coconut oil, Avocado oil, Shea Butter, Olive oil, Vitamin E Vajuvenate, Vmagic  Oils for moisture/maintenance: Coconut oil, Vitamin E oil, Emu oil  '           Overactive bladder (OAB) overview for patients:  Symptoms may include: urinary urgency (gotta go feeling) urinary frequency (voiding >8 times per day) night time urination (nocturia) urge incontinence of urine (UUI)  While we do not know the exact etiology of OAB, several treatment options exist including:  Behavioral therapy: Reducing fluid intake Decreasing bladder stimulants (such as caffeine) and irritants (such as acidic food, spicy foods, alcohol) Urge suppression strategies Bladder retraining via timed voiding  Pelvic floor physical therapy  Medication(s) - can use one or both of the drug classes below. Anticholinergic / antimuscarinic medications:  Mechanism of action: Activate M3 receptors to reduce detrusor stimulation and increase bladder capacity  (parasympathetic nervous system). Effect: Relaxes the bladder to decrease overactivity, increase bladder storage capacity, and increase time between voids. Onset: Slow acting (may take 8-12 weeks to determine efficacy). Medications include: Vesicare (Solifenacin), Ditropan (Oxybutynin), Detrol (Tolterodine), Toviaz (Fesoterodine), Sanctura (Trospium), Urispas (Flavoxate), Enablex (Darifenacin), Bentyl (Dicyclomine), Levsin (Hyoscyamine ). Potential side effects include but are  not limited to: Dry eyes, dry mouth, constipation, cognitive impairment, dementia risk with long term use, and urinary retention/ incomplete bladder emptying. Insurance companies generally prefer for patients to try 1-2 anticholinergic / antimuscarinic medications first due to low cost. Some exceptions are made based on patient-specific comorbidities / risk factors. Beta-3 agonist medications: Mechanism of action: Stimulates selective B3 adrenergic receptors to cause smooth muscle bladder relaxation (sympathetic nervous system). Effect: Relaxes the bladder to decrease overactivity, increase bladder storage capacity, and increase time between voids. Onset: Slow acting (may take 8-12 weeks to determine efficacy). Medications include: Myrbetriq (Mirabegron) and Vibegron Seferino Dade). Potential side effects include but are not limited to: urinary retention / incomplete bladder emptying and elevated blood pressure (more likely to occur in individuals with pre-existing uncontrolled hypertension).  These medications tend to be more expensive than the anticholinergic / antimuscarinic medications.   For patients with refractory OAB (if the above treatment options have been unsuccessful): Posterior tibial nerve stimulation (PTNS). Small acupuncture-type needle inserted near ankle with electric current to stimulate bladder via posterior tibial nerve pathway. Initially requires 12 weekly in-office treatments lasting 30 minutes each; followed by monthly in-office treatments lasting 30 minutes each for 1 year.  Bladder Botox injections. How it is done: Typically done via in-office cystoscopy; sometimes done in the OR depending on the situation. The bladder is numbed with lidocaine  instilled via a catheter. Then the urologist injects Botox into the bladder muscle wall in about 20 locations. Causes local paralysis of the bladder muscle at the injection sites to reduce bladder muscle overactivity / spasms. The effect  lasts for approximately 6 months and cannot be reversed once performed. Risks may included but are not limited to: infection, incomplete bladder emptying/ urinary retention, short term need for self-catheterization or indwelling catheter, and need for repeat therapy. There is a 5-12% chance of needing to catheterize with Botox - that usually resolves in a few months as the Botox wears off. Typically Botox injections would need to be repeated every 3-12 months since this is not a permanent therapy.  Sacral neuromodulation trial (Medtronic lnterStim or Axonics implant). Sacral neuromodulation is FDA-approved for uncontrolled urinary urgency, urinary frequency, urinary urge incontinence, non-obstructive urinary retention, or fecal incontinence. It is not FDA-approved as a treatment for pain. The goal of this therapy is at least a 50% improvement in symptoms. It is NOT realistic to expect a 100% cure. This is a a 2-step outpatient procedure. After a successful test period, a permanent wire and generator are placed in the OR. We discussed the risk of infection. We reviewed the fact that about 30% of patients fail the test phase and are not candidates for permanent generator placement. During the 1-2 week trial phase, symptoms are documented by the patient to determine response. If patient gets at least a 50% improvement in symptoms, they may then proceed with Step 2. Step 1: Trial lead placement. Per physician discretion, may done one of two ways: Percutaneous nerve evaluation (PNE) in the [facility] urology office. Performed by urologist under local anesthesia (numbing the area with lidocaine ) using a spinal needle for placement of test wire, which usually stays in place for 5-7 days to determine therapy response. Test lead placement in OR under anesthesia. Usually stays in place 2 weeks to determine therapy response. > Step 2: Permanent implantation of sacral neuromodulation device, which is performed in the  OR.  Sacral neuromodulation implants: All are conditionally MRI safe. Manufacturer: Medtronic Website: [email or web address] [email or web address]-for-you.html Options: lnterStim X: Non-rechargeable. The battery lasts 10 years on average. lnterStim Micro: Rechargeable. The battery lasts 15 years on average and must be charged routinely. Approximately 50% smaller implant than lnterStim X implant.  Manufacturer: Axonics Website: Findrealrelief.axonics.com Options: Non-rechargeable (Axonics F15): The battery lasts 15 years on average. Rechargeable (Axonics R20): The battery lasts 20 years on average and must be charged in office for about 1 hour every 6-10 months on average. Approximately 50% smaller implant than Axonics non-rechargeable implant.  Note: Generally the rechargeable devices are only advised for very small or thin patients who may not have sufficient adipose tissue to comfortably overlay the implanted device.  Suprapubic catheter (SP tube) placement. Only done in severely refractory OAB when all other options have failed or are not a viable treatment choice depending on patient  factors. Involves placement of a catheter through the lower abdomen into the bladder to continuously drain the bladder into an external collection bag, which patient can then empty at their convenience every few hours. Done via an outpatient surgical procedure in the OR under anesthesia. Risks may included but are not limited to: surgical site pain, infections, skin irritation / breakdown, chronic bacteriuria, symptomatic UTls. The SP tube must stay in place continuously. This is a reversible procedure however - the insertion site will close if catheter is removed for more than a few hours. The SP tube must be exchanged routinely every 4 weeks to prevent the catheter from becoming clogged with sediment. SP tube exchanges are typically performed  at a urology nurse visit or by a home health nurse.

## 2024-02-22 ENCOUNTER — Ambulatory Visit (INDEPENDENT_AMBULATORY_CARE_PROVIDER_SITE_OTHER)

## 2024-02-22 DIAGNOSIS — N3281 Overactive bladder: Secondary | ICD-10-CM | POA: Diagnosis not present

## 2024-02-22 LAB — BLADDER SCAN AMB NON-IMAGING: Scan Result: 206

## 2024-02-22 NOTE — Progress Notes (Addendum)
 Bladder Scan completed today.  Patient can void prior to the bladder scan. Bladder scan result: 225 ML 1st attempt before PVR 2nd attempt 206 after PVR Performed By: Carlos, CMA  Additional notes-

## 2024-03-17 ENCOUNTER — Ambulatory Visit: Admitting: Urology

## 2024-03-17 ENCOUNTER — Encounter: Payer: Self-pay | Admitting: Urology

## 2024-03-17 VITALS — BP 112/69 | HR 66

## 2024-03-17 DIAGNOSIS — R339 Retention of urine, unspecified: Secondary | ICD-10-CM

## 2024-03-17 DIAGNOSIS — N952 Postmenopausal atrophic vaginitis: Secondary | ICD-10-CM

## 2024-03-17 DIAGNOSIS — N3941 Urge incontinence: Secondary | ICD-10-CM

## 2024-03-17 DIAGNOSIS — N3281 Overactive bladder: Secondary | ICD-10-CM | POA: Diagnosis not present

## 2024-03-17 DIAGNOSIS — Z87440 Personal history of urinary (tract) infections: Secondary | ICD-10-CM | POA: Diagnosis not present

## 2024-03-17 LAB — BLADDER SCAN AMB NON-IMAGING: Scan Result: 93

## 2024-03-17 NOTE — Progress Notes (Signed)
 Name: Shelby Chan DOB: 1933/02/02 MRN: [id number]  History of Present Illness: Shelby Chan is a 89 y.o. female who presents today for follow up visit at [facility] [place]. She resides at [name] and is accompanied by her daughter Elvie, who assists with providing history due to patient's cognitive impairment.   Recent history:  > 01/08/2024 - 01/10/2024: Admitted for acute metabolic encephalopathy possibly due to COVID and UTI. Had a mild leukocytosis with UA concerning for UTI.  Urine culture with GNR. Urine culture positive for E. Coli and Aerococcus. Treated with cephalosporins.   > 02/01/2024 - 02/03/2024: Admitted for acute UTI, suspected left-sided pyelonephritis. CT scan of the abdomen pelvis was done, shows significant inflammatory changes on the left without hydronephrosis or bladder retention [and no stones]. Postvoid residuals, 270 mL, 130 mL.  Renal functions normal. Responded well to Rocephin , will continue Keflex  for 10 additional days.  Treating prolonged therapy due to recurrence. And was prescribed Flomax .  She was instructed to do double voiding to avoid postvoid residuals.   At 1st visit on 02/18/2024: 1. Incomplete bladder emptying. - Reported slow weak urinary stream and hesitancy.  - PVR was 287 ml. - Advised discontinuation of Myrbetriq (Mirabegron) since incomplete bladder emptying / urinary retention are known potential side effects of that medication.  2. OAB with urge incontinence. - Gemtesa but that was too expensive.  3. For UTI prevention in post-menopausal woman:  - Advised starting topical vaginal estrogen cream for likely vaginal atrophy / genitourinary syndrome of menopause (GSM).   Since last visit: > 02/22/2024: Urology nurse visit. PVR = 206 ml.  Today: She reports that her urinary symptoms are much improved - states that her weak urinary stream and hesitancy have resolved. Denies dysuria, gross hematuria, straining to  void, or sensations of incomplete emptying. She denies some ongoing urge incontinence but states it is much less than it used to be; currently she is wearing 2 pads per day on average and states that is manageable. Denies stress urinary incontinence. Reports some low back pain but she suspects that is related her hips; she denies concern for GU stone.    Medications: Current Outpatient Medications  Medication Sig Dispense Refill   acetaminophen  (TYLENOL ) 650 MG CR tablet Take 1,300 mg by mouth every 8 (eight) hours as needed for pain.     aspirin  EC 81 MG tablet Take 1 tablet (81 mg total) by mouth 2 (two) times daily. Continue taking once a day after a month (Patient taking differently: Take 81 mg by mouth daily.) 60 tablet 0   Calcium Carbonate-Vitamin D  (CALTRATE 600+D PO) Take 1 tablet by mouth daily.     cetirizine (ZYRTEC) 10 MG tablet Take 10 mg by mouth daily.     Coenzyme Q10 100 MG capsule Take 100 mg by mouth daily.     estradiol  (ESTRACE ) 0.1 MG/GM vaginal cream Discard plastic applicator. Insert a blueberry size amount (approximately 1 gram) of cream on fingertip inside vagina at bedtime every night for 1 week then every other night. For long term use. 30 g 3   Multiple Vitamins-Minerals (CENTRUM SILVER WOMEN 50+) TABS Take 1 tablet by mouth daily with breakfast.     No current facility-administered medications for this visit.    Allergies: No Known Allergies  Past Medical History:  Diagnosis Date   Arthritis    Complication of anesthesia    GERD (gastroesophageal reflux disease)    High cholesterol  PONV (postoperative nausea and vomiting)    Past Surgical History:  Procedure Laterality Date   CHOLECYSTECTOMY     GLUTEUS MINIMUS REPAIR Right 09/24/2023   Procedure: RIGHT GLUTEUS MEDIUS REPAIR WITH COLLAGEN PATCH AUGMENTATION;  Surgeon: Genelle Standing, MD;  Location: [facility] OR;  Service: Orthopedics;  Laterality: Right;   HIP PINNING,CANNULATED Right 11/07/2021   Procedure:  RIGHT HIP PERCUTANEOUS SCREW FIXATION;  Surgeon: Genelle Standing, MD;  Location: [facility] OR;  Service: Orthopedics;  Laterality: Right;   Right leg fracture s/p repair with 2 pins placed     Family History  Problem Relation Age of Onset   Heart disease Mother    Social History   Socioeconomic History   Marital status: Widowed    Spouse name: Not on file   Number of children: Not on file   Years of education: Not on file   Highest education level: Not on file  Occupational History   Not on file  Tobacco Use   Smoking status: Never   Smokeless tobacco: Never  Vaping Use   Vaping status: Never Used  Substance and Sexual Activity   Alcohol use: Never   Drug use: Never   Sexual activity: Not on file  Other Topics Concern   Not on file  Social History Narrative   Not on file   Social Drivers of Health   Financial Resource Strain: Not on file  Food Insecurity: No Food Insecurity (02/01/2024)   Hunger Vital Sign    Worried About Running Out of Food in the Last Year: Never true    Ran Out of Food in the Last Year: Never true  Transportation Needs: No Transportation Needs (02/01/2024)   PRAPARE - Administrator, Civil Service (Medical): No    Lack of Transportation (Non-Medical): No  Physical Activity: Not on file  Stress: Not on file  Social Connections: Moderately Integrated (02/01/2024)   Social Connection and Isolation Panel    Frequency of Communication with Friends and Family: More than three times a week    Frequency of Social Gatherings with Friends and Family: More than three times a week    Attends Religious Services: More than 4 times per year    Active Member of [employer] or Organizations: No    Attends Banker Meetings: 1 to 4 times per year    Marital Status: Widowed  Intimate Partner Violence: Not At Risk (02/01/2024)   Humiliation, Afraid, Rape, and Kick questionnaire    Fear of Current or Ex-Partner: No    Emotionally Abused: No    Physically  Abused: No    Sexually Abused: No    Review of Systems Constitutional: Patient denies any unintentional weight loss or change in strength lntegumentary: Patient denies any rashes or pruritus Cardiovascular: Patient denies chest pain or syncope Respiratory: Patient denies shortness of breath Gastrointestinal: Patient denies nausea, vomiting, constipation, or diarrhea  Musculoskeletal: Patient denies muscle cramps or weakness Neurologic: Patient denies convulsions or seizures Allergic/Immunologic: Patient denies recent allergic reaction(s) Hematologic/Lymphatic: Patient denies bleeding tendencies Endocrine: Patient denies heat/cold intolerance  GU: As per HPI.  OBJECTIVE Vitals:   03/17/24 1527  BP: 112/69  Pulse: 66   There is no height or weight on file to calculate BMI.  Physical Examination Constitutional: No obvious distress; patient is non-toxic appearing  Cardiovascular: No visible lower extremity edema.  Respiratory: The patient does not have audible wheezing/stridor; respirations do not appear labored  Gastrointestinal: Abdomen non-distended Musculoskeletal: Normal ROM of UEs  Skin: No obvious rashes/open sores  Neurologic: CN 2-12 grossly intact Psychiatric: Answered questions appropriately with normal affect  Hematologic/Lymphatic/Immunologic: No obvious bruises or sites of spontaneous bleeding  Urine microscopy: >30 WBC/hpf, 3-10 RBC/hpf, many bacteria PVR: 93 ml  ASSESSMENT OAB (overactive bladder) - Plan: Urinalysis, Routine w reflex microscopic, BLADDER SCAN AMB NON-IMAGING  History of UTI - Plan: Urinalysis, Routine w reflex microscopic  Atrophic vaginitis - Plan: Urinalysis, Routine w reflex microscopic  Incomplete bladder emptying - Plan: Urinalysis, Routine w reflex microscopic  Urge incontinence of urine  UA today appears abnormal however patient is asymptomatic for UTI, therefore acute antibiotic treatment is not indicated. Asymptomatic  bacteriuria versus contaminated urine specimen.  OAB with urinary urge incontinence: Manageable without Myrbetriq per patient.   Her UTI risk is lower now that her bladder emptying is much improved following discontinuation of Myrbetriq. We reviewed the rationale for topical vaginal estrogen cream use; patient elects to continue that for UTI prevention.   We agreed to plan for follow up in 3 months or sooner if needed. Patient verbalized understanding of and agreement with current plan. All questions were answered.  PLAN Advised the following: 1. Continue topical vaginal estrogen cream as prescribed.  2. Minimize / avoid caffeine intake. 3.Timed voiding. 4. Return in about 3 months (around 06/17/2024) for OAB, with UA & PVR.  Orders Placed This Encounter  Procedures   Urinalysis, Routine w reflex microscopic   BLADDER SCAN AMB NON-IMAGING    It has been explained that the patient is to follow regularly with their PCP in addition to all other providers involved in their care and to follow instructions provided by these respective offices. Patient advised to contact urology clinic if any urologic-pertaining questions, concerns, new symptoms or problems arise in the interim period.  Patient Instructions         Overactive bladder (OAB) overview for patients:  Symptoms may include: urinary urgency (gotta go feeling) urinary frequency (voiding >8 times per day) night time urination (nocturia) urge incontinence of urine (UUI)  While we do not know the exact etiology of OAB, several treatment options exist including:  Behavioral therapy: Reducing fluid intake Decreasing bladder stimulants (such as caffeine) and irritants (such as acidic food, spicy foods, alcohol) Urge suppression strategies Bladder retraining via timed voiding  Pelvic floor physical therapy  Medication(s) - can use one or both of the drug classes below. Anticholinergic / antimuscarinic medications:   Mechanism of action: Activate M3 receptors to reduce detrusor stimulation and increase bladder capacity   (parasympathetic nervous system). Effect: Relaxes the bladder to decrease overactivity, increase bladder storage capacity, and increase time between voids. Onset: Slow acting (may take 8-12 weeks to determine efficacy). Medications include: Vesicare (Solifenacin), Ditropan (Oxybutynin), Detrol (Tolterodine), Toviaz (Fesoterodine), Sanctura (Trospium), Urispas (Flavoxate), Enablex (Darifenacin), Bentyl (Dicyclomine), Levsin (Hyoscyamine ). Potential side effects include but are not limited to: Dry eyes, dry mouth, constipation, cognitive impairment, dementia risk with long term use, and urinary retention/ incomplete bladder emptying. Insurance companies generally prefer for patients to try 1-2 anticholinergic / antimuscarinic medications first due to low cost. Some exceptions are made based on patient-specific comorbidities / risk factors. Beta-3 agonist medications: Mechanism of action: Stimulates selective B3 adrenergic receptors to cause smooth muscle bladder relaxation (sympathetic nervous system). Effect: Relaxes the bladder to decrease overactivity, increase bladder storage capacity, and increase time between voids. Onset: Slow acting (may take 8-12 weeks to determine efficacy). Medications include: Myrbetriq (Mirabegron) and Vibegron Andris). Potential side effects include but are  not limited to: urinary retention / incomplete bladder emptying and elevated blood pressure (more likely to occur in individuals with pre-existing uncontrolled hypertension). These medications tend to be more expensive than the anticholinergic / antimuscarinic medications.   For patients with refractory OAB (if the above treatment options have been unsuccessful): Posterior tibial nerve stimulation (PTNS). Small acupuncture-type needle inserted near ankle with electric current to stimulate bladder via posterior  tibial nerve pathway. Initially requires 12 weekly in-office treatments lasting 30 minutes each; followed by monthly in-office treatments lasting 30 minutes each for 1 year.  Bladder Botox injections. How it is done: Typically done via in-office cystoscopy; sometimes done in the OR depending on the situation. The bladder is numbed with lidocaine  instilled via a catheter. Then the urologist injects Botox into the bladder muscle wall in about 20 locations. Causes local paralysis of the bladder muscle at the injection sites to reduce bladder muscle overactivity / spasms. The effect lasts for approximately 6 months and cannot be reversed once performed. Risks may included but are not limited to: infection, incomplete bladder emptying/ urinary retention, short term need for self-catheterization or indwelling catheter, and need for repeat therapy. There is a 5-12% chance of needing to catheterize with Botox - that usually resolves in a few months as the Botox wears off. Typically Botox injections would need to be repeated every 3-12 months since this is not a permanent therapy.  Sacral neuromodulation trial (Medtronic lnterStim or Axonics implant). Sacral neuromodulation is FDA-approved for uncontrolled urinary urgency, urinary frequency, urinary urge incontinence, non-obstructive urinary retention, or fecal incontinence. It is not FDA-approved as a treatment for pain. The goal of this therapy is at least a 50% improvement in symptoms. It is NOT realistic to expect a 100% cure. This is a a 2-step outpatient procedure. After a successful test period, a permanent wire and generator are placed in the OR. We discussed the risk of infection. We reviewed the fact that about 30% of patients fail the test phase and are not candidates for permanent generator placement. During the 1-2 week trial phase, symptoms are documented by the patient to determine response. If patient gets at least a 50% improvement in symptoms,  they may then proceed with Step 2. Step 1: Trial lead placement. Per physician discretion, may done one of two ways: Percutaneous nerve evaluation (PNE) in the [facility] urology office. Performed by urologist under local anesthesia (numbing the area with lidocaine ) using a spinal needle for placement of test wire, which usually stays in place for 5-7 days to determine therapy response. Test lead placement in OR under anesthesia. Usually stays in place 2 weeks to determine therapy response. > Step 2: Permanent implantation of sacral neuromodulation device, which is performed in the OR.  Sacral neuromodulation implants: All are conditionally MRI safe. Manufacturer: Medtronic Website: BuffaloDryCleaner.gl therapy/right-for-you.html Options: lnterStim X: Non-rechargeable. The battery lasts 10 years on average. lnterStim Micro: Rechargeable. The battery lasts 15 years on average and must be charged routinely. Approximately 50% smaller implant than lnterStim X implant.  Manufacturer: Axonics Website: Findrealrelief.axonics.com Options: Non-rechargeable (Axonics F15): The battery lasts 15 years on average. Rechargeable (Axonics R20): The battery lasts 20 years on average and must be charged in office for about 1 hour every 6-10 months on average. Approximately 50% smaller implant than Axonics non-rechargeable implant.  Note: Generally the rechargeable devices are only advised for very small or thin patients who may not have sufficient adipose tissue to comfortably overlay the implanted device.  Suprapubic catheter (SP tube)  placement. Only done in severely refractory OAB when all other options have failed or are not a viable treatment choice depending on patient factors. Involves placement of a catheter through the lower abdomen into the bladder to continuously drain the bladder into an external collection bag, which patient can  then empty at their convenience every few hours. Done via an outpatient surgical procedure in the OR under anesthesia. Risks may included but are not limited to: surgical site pain, infections, skin irritation / breakdown, chronic bacteriuria, symptomatic UTls. The SP tube must stay in place continuously. This is a reversible procedure however - the insertion site will close if catheter is removed for more than a few hours. The SP tube must be exchanged routinely every 4 weeks to prevent the catheter from becoming clogged with sediment. SP tube exchanges are typically performed at a urology nurse visit or by a home health nurse.       UTI prevention / management:  UTI symptoms may include:  - Pain / burning / discomfort when urinating - Recent increase in urinary urgency (how quickly you feel like you need to rush to the bathroom) - Recent increase in urinary frequency (how often you are urinating) - Fever - Acute mental status change / confusion - Fatigue / Feeling tired - Weakness - Note: Urine color, clarity, and odor are not considered to be clinically significant indicators of UTI and do not warrant urine testing unless patient is also experiencing UTI symptoms such as those listed above.  Difference between Urinalysis (urine dipstick test) and Urine culture / Why urine culture often needed to determine appropriate diagnosis and treatment of urologic symptoms: > Urinalysis (urine dipstick test): A quick office test used as an indicator to determine whether or not further testing is necessary (such as a urine culture, urine microscopy, etc.) The urinalysis cannot differentiate a true bacterial UTI or give a definitive diagnosis for the findings.  > Urine culture: May be performed based on the findings of a urinalysis to evaluate for UTI. Grows out on a petri dish for 48-72 hours. Provides important information about: whether or not bacterial growth is present and if so: what the  predominant bacteria is which antibiotics will work best against that bacteria That information is important so that we can diagnose and treat patients appropriately as there are other conditions which may mimic UTls which must not be missed (such as cancer, interstitial cystitis, stones, etc.). Assists us  with antibiotic stewardship to minimize patient's risk for developing antibiotic resistance (getting to a point where no antibiotics work anymore).  Options when UTI symptoms occur: 1. Call [facility] [place] and request to speak with triage nurse (phone # [phone], select option 3). In accordance with clinic guidelines the nurse will determine next steps based on patient-reported symptoms, which may include: same-day lab visit to provide urine specimen, recommendation to schedule Urology office visit appointment for further evaluation, recommendation to proceed to ER, etc. 2. Call your Primary Care Provider (PCP) office to request urgent / same-day visit. Be sure to request for urine culture to be ordered and have results faxed to Urology (fax # [phone]).  3. Go to urgent care. Be sure to request for urine culture to be ordered and have results faxed to Urology (fax # [phone]).   For bladder pain/ burning with urination: - Can take over-the-counter Pyridium (phenazopyridine; commonly known under the AZO brand) for a few days as needed. Limit use to no more than 3 days consecutively  due to risk for methemoglobinemia, liver function issues, and bone health damage with long term use of Pyridium. - Alternative: Prescription urinary analgesics (such as Uribel, Urogesic blue, Urelle, Uro-MP). Often expensive / poorly covered by insurance unfortunately.  Options / recommendations for UTI prevention: - Topical vaginal estrogen for vaginal atrophy (aka Genitourinary Syndrome of Menopause (GSM)). - Adequate daily fluid intake to flush out the urinary tract. - Go to the bathroom  to urinate every 4-6 hours while awake to minimize urinary stasis / bacterial overgrowth in the bladder. - Proanthocyanidin (PAC) supplement 36 mg daily; must be soluble (insoluble form of PAC will be ineffective). Recommended brand: Ellura. This is an over-the-counter supplement (often must be found/ purchased online) supplement derived from cranberries with concentrated active component: Proanthocyanidin (PAC) 36 mg daily. Decreases bacterial adherence to bladder lining.  - D-mannose powder (2 grams daily). This is an over-the-counter supplement which decreases bacterial adherence to bladder lining (it is a sugar that inhibits bacterial adherence to urothelial cells by binding to the pili of enteric bacteria). Take as per manufacturer recommendation. Can be used as an alternative or in addition to the concentrated cranberry supplement.  - Vitamin C supplement to acidify urine to minimize bacterial growth.  - Probiotic to maintain healthy vaginal microbiome to suppress bacteria at urethral opening. Brand recommendations: Verlon (includes probiotic & D-mannose ), Feminine Balance (highest concentration of lactobacillus) or Hyperbiotic Pro 15.  Note for patients with diabetes:  - Be aware that D-mannose contains sugar.  Note for patients with interstitial cystitis (IC):  - Patients with IC should typically avoid cranberry/ PAC supplements and Vitamin C supplements due to their acidity, which may exacerbate IC-related bladder pain. - Symptoms of true bacterial UTI can overlap / mimic symptoms of an IC flare up. Antibiotic use is NOT indicated for IC flare ups. Urine culture needed prior to antibiotic treatment for IC patients. The goal is to minimize your risk for developing antibiotic-resistant bacteria.    Vaginal atrophy I Genitourinary syndrome of menopause (GSM):  What it is: Changes in the vaginal environment (including the vulva and urethra) including: Thinning of the epithelium (skin/ mucosa  surface) Can contribute to urinary urgency and frequency Can contribute to dryness, itching, irritation of the vulvar and vaginal tissue Can contribute to pain with intercourse Can contribute to physical changes of the labia, vulva, and vagina such as: Narrowing of the vaginal opening Decreased vaginal length Loss of labial architecture Labial adhesions Pale color of vulvovaginal tissue Loss of pubic hair Allows bacteria to become adherent Results in increased risk for urinary tract infection (UTI) due to bacterial overgrowth and migration up the urethra into the bladder Change in vaginal pH (acid/ base balance) Allows for alteration / disruption of the normal bacterial flora / microbiome Results in increased risk for urinary tract infection (UTI) due to bacterial overgrowth  Treatment options: Over-the-counter lubricants (see list below). Prescription vaginal estrogen replacement. Options: Topical vaginal estrogen (estradiol ) cream: (Estrace , Premarin, compounded) Apply as directed Estring  vaginal ring Exchanged every 3 months (either at home or in office by provider) Vagifem  vaginal tablet Inserted nightly for 2 weeks then twice a week (long term) lntrarosa vaginal suppository Vaginal DHEA: converts to estrogen in vaginal tissue without systemic effect Inserted nightly (long term) 3. Vaginal laser therapy (Mona Olam touch) Performed in 3 treatments each 6 weeks apart (available in our [place] office). Can feel like a sunburn for 3-4 days after each treatment until new skin heals in. Usually not covered by  insurance. Estimated cost is $1500 for all 3 sessions.  FYI regarding prescription vaginal estrogen treatment options: - All topical vaginal estrogen replacement options are equivalent in terms of efficacy. - Topical vaginal estrogen replacement will take about 3 months to be effective. - OK to have sex with any of the topical vaginal estrogen replacement options. - Topical  vaginal estrogen replacement may sting/burn initially due to severe dryness, which will improve with ongoing treatment. - Studies have demonstrated negligible systemic absorption of low-dose intravaginal estrogen cream therefore the risk for cancer development or recurrence with this medication is minimal.  Genitourinary Syndrome of Menopause: AUA/SUFU/AUGS Guideline (2025) [place]Info.at  Topical vaginal estrogen cream safe to use with breast cancer history WomenInsider.com.ee  Topical vaginal estrogen cream safe to use with blood clot history [email or web address]   Lubricants and Moisturizers for Treating Genitourinary Syndrome of Menopause and Vulvovaginal Atrophy Treatment Comments I Available Products   Lubricants   Water-based Ingredients: Deionized water, glycerin, propylene glycol; latex safe; rare irritation; dry out with extended sexual activity Astroglide, Good Clean Love, K-Y Jelly, Natural, Organic, Pink, Sliquid, Sylk, Yes    Oil Based Ingredients: avocado, olive, peanut, corn; latex safe; can be used with silicone products; staining; safe (unless peanut allergy); non-irritating Coconut oil, vegetable oil, vitamin E oil  Silicone-Based Ingredients: Silicone polymers; staining; typically nonirritating, long lasting; waterproof; should not be used with silicone dilators, sexual toys, or gynecologic products Astroglide X, Oceanus Ultra Pure, Pink Silicone, Pjur Eros, Replens Silky Smooth, Silicone Premium JO, SKYN, Uberlube, [employer] Based Minimize harm to sperm motility; designed for couples trying to conceive:  Astroglide TTC, Conceive Plus,  PreSeed, Yes Baby  Fertility Friendly Minimize harm to sperm motility; designed for couples trying to conceive:  Astroglide, TTC, Conceive Plus, PreSeed, Yes Baby  Vaginal Moisturizers   Vaginal Moisturizers For maintenance use 1 to 3 times weekly; can benefit women with dryness, chafing with AOL, and recurrent vaginal infections irrespective of sexual activity timing Balance Active Menopause Vaginal Moisturizing Lubricant, Canesintima Intimate Moisturizer, Replens, Rephresh, Sylk Natural Intimate Moisturizer, Yes Vaginal Moisturizer  Hybrids Properties of both water and silicone-based products (combination of a vaginal lubricant and moisturizer); Non-irritating; good option for women with allergies and sensitivities Lubrigyn, Luvena  Suppositories Hyaluronic acid to retain moisture Revaree  Vulvar Soothing Creams/Oils    Medicated Creams Pain and burn relief; Ingredients: 4% Lidocaine , Aloe Vera gel Releveum ([place])  Non-Medicated Creams For anti-itch and moisture/maintenance; Ingredients: Coconut oil, Avocado oil, Shea Butter, Olive oil, Vitamin E Vajuvenate, Vmagic  Oils for moisture/maintenance: Coconut oil, Vitamin E oil, Emu oil     Electronically signed by:  Lauraine JAYSON Oz, FNP   03/17/24    3:59 PM

## 2024-03-17 NOTE — Patient Instructions (Signed)
 Overactive bladder (OAB) overview for patients:  Symptoms may include: urinary urgency (gotta go feeling) urinary frequency (voiding >8 times per day) night time urination (nocturia) urge incontinence of urine (UUI)  While we do not know the exact etiology of OAB, several treatment options exist including:  Behavioral therapy: Reducing fluid intake Decreasing bladder stimulants (such as caffeine) and irritants (such as acidic food, spicy foods, alcohol) Urge suppression strategies Bladder retraining via timed voiding  Pelvic floor physical therapy  Medication(s) - can use one or both of the drug classes below. Anticholinergic / antimuscarinic medications:  Mechanism of action: Activate M3 receptors to reduce detrusor stimulation and increase bladder capacity  (parasympathetic nervous system). Effect: Relaxes the bladder to decrease overactivity, increase bladder storage capacity, and increase time between voids. Onset: Slow acting (may take 8-12 weeks to determine efficacy). Medications include: Vesicare (Solifenacin), Ditropan (Oxybutynin), Detrol (Tolterodine), Toviaz (Fesoterodine), Sanctura (Trospium), Urispas (Flavoxate), Enablex (Darifenacin), Bentyl (Dicyclomine), Levsin (Hyoscyamine ). Potential side effects include but are not limited to: Dry eyes, dry mouth, constipation, cognitive impairment, dementia risk with long term use, and urinary retention/ incomplete bladder emptying. Insurance companies generally prefer for patients to try 1-2 anticholinergic / antimuscarinic medications first due to low cost. Some exceptions are made based on patient-specific comorbidities / risk factors. Beta-3 agonist medications: Mechanism of action: Stimulates selective B3 adrenergic receptors to cause smooth muscle bladder relaxation (sympathetic nervous system). Effect: Relaxes the bladder to decrease overactivity, increase bladder storage capacity, and increase time between  voids. Onset: Slow acting (may take 8-12 weeks to determine efficacy). Medications include: Myrbetriq (Mirabegron) and Vibegron Shelby Chan). Potential side effects include but are not limited to: urinary retention / incomplete bladder emptying and elevated blood pressure (more likely to occur in individuals with pre-existing uncontrolled hypertension). These medications tend to be more expensive than the anticholinergic / antimuscarinic medications.   For patients with refractory OAB (if the above treatment options have been unsuccessful): Posterior tibial nerve stimulation (PTNS). Small acupuncture-type needle inserted near ankle with electric current to stimulate bladder via posterior tibial nerve pathway. Initially requires 12 weekly in-office treatments lasting 30 minutes each; followed by monthly in-office treatments lasting 30 minutes each for 1 year.  Bladder Botox injections. How it is done: Typically done via in-office cystoscopy; sometimes done in the OR depending on the situation. The bladder is numbed with lidocaine  instilled via a catheter. Then the urologist injects Botox into the bladder muscle wall in about 20 locations. Causes local paralysis of the bladder muscle at the injection sites to reduce bladder muscle overactivity / spasms. The effect lasts for approximately 6 months and cannot be reversed once performed. Risks may included but are not limited to: infection, incomplete bladder emptying/ urinary retention, short term need for self-catheterization or indwelling catheter, and need for repeat therapy. There is a 5-12% chance of needing to catheterize with Botox - that usually resolves in a few months as the Botox wears off. Typically Botox injections would need to be repeated every 3-12 months since this is not a permanent therapy.  Sacral neuromodulation trial (Medtronic lnterStim or Axonics implant). Sacral neuromodulation is FDA-approved for uncontrolled urinary urgency,  urinary frequency, urinary urge incontinence, non-obstructive urinary retention, or fecal incontinence. It is not FDA-approved as a treatment for pain. The goal of this therapy is at least a 50% improvement in symptoms. It is NOT realistic to expect a 100% cure. This is a a 2-step outpatient procedure. After a successful test period,  a permanent wire and generator are placed in the OR. We discussed the risk of infection. We reviewed the fact that about 30% of patients fail the test phase and are not candidates for permanent generator placement. During the 1-2 week trial phase, symptoms are documented by the patient to determine response. If patient gets at least a 50% improvement in symptoms, they may then proceed with Step 2. Step 1: Trial lead placement. Per physician discretion, may done one of two ways: Percutaneous nerve evaluation (PNE) in the [facility] urology office. Performed by urologist under local anesthesia (numbing the area with lidocaine ) using a spinal needle for placement of test wire, which usually stays in place for 5-7 days to determine therapy response. Test lead placement in OR under anesthesia. Usually stays in place 2 weeks to determine therapy response. > Step 2: Permanent implantation of sacral neuromodulation device, which is performed in the OR.  Sacral neuromodulation implants: All are conditionally MRI safe. Manufacturer: Medtronic Website: [employer]DryCleaner.gl therapy/right-for-you.html Options: lnterStim X: Non-rechargeable. The battery lasts 10 years on average. lnterStim Micro: Rechargeable. The battery lasts 15 years on average and must be charged routinely. Approximately 50% smaller implant than lnterStim X implant.  Manufacturer: Axonics Website: Findrealrelief.axonics.com Options: Non-rechargeable (Axonics F15): The battery lasts 15 years on average. Rechargeable (Axonics R20): The  battery lasts 20 years on average and must be charged in office for about 1 hour every 6-10 months on average. Approximately 50% smaller implant than Axonics non-rechargeable implant.  Note: Generally the rechargeable devices are only advised for very small or thin patients who may not have sufficient adipose tissue to comfortably overlay the implanted device.  Suprapubic catheter (SP tube) placement. Only done in severely refractory OAB when all other options have failed or are not a viable treatment choice depending on patient factors. Involves placement of a catheter through the lower abdomen into the bladder to continuously drain the bladder into an external collection bag, which patient can then empty at their convenience every few hours. Done via an outpatient surgical procedure in the OR under anesthesia. Risks may included but are not limited to: surgical site pain, infections, skin irritation / breakdown, chronic bacteriuria, symptomatic UTls. The SP tube must stay in place continuously. This is a reversible procedure however - the insertion site will close if catheter is removed for more than a few hours. The SP tube must be exchanged routinely every 4 weeks to prevent the catheter from becoming clogged with sediment. SP tube exchanges are typically performed at a urology nurse visit or by a home health nurse.       UTI prevention / management:  UTI symptoms may include:  - Pain / burning / discomfort when urinating - Recent increase in urinary urgency (how quickly you feel like you need to rush to the bathroom) - Recent increase in urinary frequency (how often you are urinating) - Fever - Acute mental status change / confusion - Fatigue / Feeling tired - Weakness - Note: Urine color, clarity, and odor are not considered to be clinically significant indicators of UTI and do not warrant urine testing unless patient is also experiencing UTI symptoms such as those listed  above.  Difference between Urinalysis (urine dipstick test) and Urine culture / Why urine culture often needed to determine appropriate diagnosis and treatment of urologic symptoms: > Urinalysis (urine dipstick test): A quick office test used as an indicator to determine whether or not further testing is necessary (such as a urine culture, urine microscopy,  etc.) The urinalysis cannot differentiate a true bacterial UTI or give a definitive diagnosis for the findings.  > Urine culture: May be performed based on the findings of a urinalysis to evaluate for UTI. Grows out on a petri dish for 48-72 hours. Provides important information about: whether or not bacterial growth is present and if so: what the predominant bacteria is which antibiotics will work best against that bacteria That information is important so that we can diagnose and treat patients appropriately as there are other conditions which may mimic UTls which must not be missed (such as cancer, interstitial cystitis, stones, etc.). Assists us  with antibiotic stewardship to minimize patient's risk for developing antibiotic resistance (getting to a point where no antibiotics work anymore).  Options when UTI symptoms occur: 1. Call [facility] [place] and request to speak with triage nurse (phone # [phone], select option 3). In accordance with clinic guidelines the nurse will determine next steps based on patient-reported symptoms, which may include: same-day lab visit to provide urine specimen, recommendation to schedule Urology office visit appointment for further evaluation, recommendation to proceed to ER, etc. 2. Call your Primary Care Provider (PCP) office to request urgent / same-day visit. Be sure to request for urine culture to be ordered and have results faxed to Urology (fax # [phone]).  3. Go to urgent care. Be sure to request for urine culture to be ordered and have results faxed to Urology (fax #  [phone]).   For bladder pain/ burning with urination: - Can take over-the-counter Pyridium (phenazopyridine; commonly known under the AZO brand) for a few days as needed. Limit use to no more than 3 days consecutively due to risk for methemoglobinemia, liver function issues, and bone health damage with long term use of Pyridium. - Alternative: Prescription urinary analgesics (such as Uribel, Urogesic blue, Urelle, Uro-MP). Often expensive / poorly covered by insurance unfortunately.  Options / recommendations for UTI prevention: - Topical vaginal estrogen for vaginal atrophy (aka Genitourinary Syndrome of Menopause (GSM)). - Adequate daily fluid intake to flush out the urinary tract. - Go to the bathroom to urinate every 4-6 hours while awake to minimize urinary stasis / bacterial overgrowth in the bladder. - Proanthocyanidin (PAC) supplement 36 mg daily; must be soluble (insoluble form of PAC will be ineffective). Recommended brand: Ellura. This is an over-the-counter supplement (often must be found/ purchased online) supplement derived from cranberries with concentrated active component: Proanthocyanidin (PAC) 36 mg daily. Decreases bacterial adherence to bladder lining.  - D-mannose powder (2 grams daily). This is an over-the-counter supplement which decreases bacterial adherence to bladder lining (it is a sugar that inhibits bacterial adherence to urothelial cells by binding to the pili of enteric bacteria). Take as per manufacturer recommendation. Can be used as an alternative or in addition to the concentrated cranberry supplement.  - Vitamin C supplement to acidify urine to minimize bacterial growth.  - Probiotic to maintain healthy vaginal microbiome to suppress bacteria at urethral opening. Brand recommendations: Verlon (includes probiotic & D-mannose ), Feminine Balance (highest concentration of lactobacillus) or Hyperbiotic Pro 15.  Note for patients with diabetes:  - Be aware that  D-mannose contains sugar.  Note for patients with interstitial cystitis (IC):  - Patients with IC should typically avoid cranberry/ PAC supplements and Vitamin C supplements due to their acidity, which may exacerbate IC-related bladder pain. - Symptoms of true bacterial UTI can overlap / mimic symptoms of an IC flare up. Antibiotic use is NOT indicated for IC  flare ups. Urine culture needed prior to antibiotic treatment for IC patients. The goal is to minimize your risk for developing antibiotic-resistant bacteria.    Vaginal atrophy I Genitourinary syndrome of menopause (GSM):  What it is: Changes in the vaginal environment (including the vulva and urethra) including: Thinning of the epithelium (skin/ mucosa surface) Can contribute to urinary urgency and frequency Can contribute to dryness, itching, irritation of the vulvar and vaginal tissue Can contribute to pain with intercourse Can contribute to physical changes of the labia, vulva, and vagina such as: Narrowing of the vaginal opening Decreased vaginal length Loss of labial architecture Labial adhesions Pale color of vulvovaginal tissue Loss of pubic hair Allows bacteria to become adherent Results in increased risk for urinary tract infection (UTI) due to bacterial overgrowth and migration up the urethra into the bladder Change in vaginal pH (acid/ base balance) Allows for alteration / disruption of the normal bacterial flora / microbiome Results in increased risk for urinary tract infection (UTI) due to bacterial overgrowth  Treatment options: Over-the-counter lubricants (see list below). Prescription vaginal estrogen replacement. Options: Topical vaginal estrogen (estradiol ) cream: (Estrace , Premarin, compounded) Apply as directed Estring  vaginal ring Exchanged every 3 months (either at home or in office by provider) Vagifem  vaginal tablet Inserted nightly for 2 weeks then twice a week (long term) lntrarosa vaginal  suppository Vaginal DHEA: converts to estrogen in vaginal tissue without systemic effect Inserted nightly (long term) 3. Vaginal laser therapy (Mona Olam touch) Performed in 3 treatments each 6 weeks apart (available in our [place] office). Can feel like a sunburn for 3-4 days after each treatment until new skin heals in. Usually not covered by insurance. Estimated cost is $1500 for all 3 sessions.  FYI regarding prescription vaginal estrogen treatment options: - All topical vaginal estrogen replacement options are equivalent in terms of efficacy. - Topical vaginal estrogen replacement will take about 3 months to be effective. - OK to have sex with any of the topical vaginal estrogen replacement options. - Topical vaginal estrogen replacement may sting/burn initially due to severe dryness, which will improve with ongoing treatment. - Studies have demonstrated negligible systemic absorption of low-dose intravaginal estrogen cream therefore the risk for cancer development or recurrence with this medication is minimal.  Genitourinary Syndrome of Menopause: AUA/SUFU/AUGS Guideline (2025) [place]Info.at  Topical vaginal estrogen cream safe to use with breast cancer history WomenInsider.com.ee  Topical vaginal estrogen cream safe to use with blood clot history [email or web address]   Lubricants and Moisturizers for Treating Genitourinary Syndrome of Menopause and Vulvovaginal Atrophy Treatment Comments I Available Products   Lubricants   Water-based Ingredients: Deionized water, glycerin, propylene glycol; latex safe; rare irritation; dry out with extended sexual activity  Astroglide, Good Clean Love, K-Y Jelly, Natural, Organic, Pink, Sliquid, Sylk, Yes    Oil Based Ingredients: avocado, olive, peanut, corn; latex safe; can be used with silicone products; staining; safe (unless peanut allergy); non-irritating Coconut oil, vegetable oil, vitamin E oil  Silicone-Based Ingredients: Silicone polymers; staining; typically nonirritating, long lasting; waterproof; should not be used with silicone dilators, sexual toys, or gynecologic products Astroglide X, Oceanus Ultra Pure, Pink Silicone, Pjur Eros, Replens Silky Smooth, Silicone Premium JO, SKYN, Uberlube, [employer] Based Minimize harm to sperm motility; designed for couples trying to conceive:  Astroglide TTC, Conceive Plus, PreSeed, Yes Baby  Fertility Friendly Minimize harm to sperm motility; designed for couples trying to conceive:  Astroglide, TTC, Conceive Plus, PreSeed, Yes Baby  Vaginal Moisturizers   Vaginal Moisturizers  For maintenance use 1 to 3 times weekly; can benefit women with dryness, chafing with AOL, and recurrent vaginal infections irrespective of sexual activity timing Balance Active Menopause Vaginal Moisturizing Lubricant, Canesintima Intimate Moisturizer, Replens, Rephresh, Sylk Natural Intimate Moisturizer, Yes Vaginal Moisturizer  Hybrids Properties of both water and silicone-based products (combination of a vaginal lubricant and moisturizer); Non-irritating; good option for women with allergies and sensitivities Lubrigyn, Luvena  Suppositories Hyaluronic acid to retain moisture Revaree  Vulvar Soothing Creams/Oils    Medicated Creams Pain and burn relief; Ingredients: 4% Lidocaine , Aloe Vera gel Releveum ([place])  Non-Medicated Creams For anti-itch and moisture/maintenance; Ingredients: Coconut oil, Avocado oil, Shea Butter, Olive oil, Vitamin E Vajuvenate, Vmagic  Oils for moisture/maintenance: Coconut oil, Vitamin E oil, Emu oil

## 2024-03-18 LAB — URINALYSIS, ROUTINE W REFLEX MICROSCOPIC
Bilirubin, UA: NEGATIVE
Glucose, UA: NEGATIVE
Ketones, UA: NEGATIVE
Nitrite, UA: NEGATIVE
Specific Gravity, UA: 1.015 (ref 1.005–1.030)
Urobilinogen, Ur: 0.2 mg/dL (ref 0.2–1.0)
pH, UA: 7.5 (ref 5.0–7.5)

## 2024-03-18 LAB — MICROSCOPIC EXAMINATION: WBC, UA: >30 /HPF — AB (ref 0–5)

## 2024-03-21 DIAGNOSIS — E663 Overweight: Secondary | ICD-10-CM | POA: Diagnosis not present

## 2024-03-21 DIAGNOSIS — M1990 Unspecified osteoarthritis, unspecified site: Secondary | ICD-10-CM | POA: Diagnosis not present

## 2024-03-21 DIAGNOSIS — J309 Allergic rhinitis, unspecified: Secondary | ICD-10-CM | POA: Diagnosis not present

## 2024-03-21 DIAGNOSIS — G8929 Other chronic pain: Secondary | ICD-10-CM | POA: Diagnosis not present

## 2024-04-11 ENCOUNTER — Telehealth: Payer: Self-pay | Admitting: Urology

## 2024-04-11 DIAGNOSIS — N390 Urinary tract infection, site not specified: Secondary | ICD-10-CM | POA: Diagnosis not present

## 2024-04-11 NOTE — Telephone Encounter (Signed)
 Daughter thinks patient has UTI  Dysuria  Patient called with c/o dysuria x 1 week days.  Pain:  no pain Severity:1/10  Associated Signs and Symptoms:  Fever: noTemp. Chills: no Hematuria: no Urgency: yes Frequency: no Hesitancy:yes Incontinence: no Nausea: no Vomiting: no  Message sent to clinic staff to return call to patient to advise of plan.

## 2024-04-12 NOTE — Telephone Encounter (Signed)
 Return called to daughter from yesterday. Pt's daughter state's she took pt to PCP on yesterday for possible UTI.

## 2024-04-14 DIAGNOSIS — E782 Mixed hyperlipidemia: Secondary | ICD-10-CM | POA: Diagnosis not present

## 2024-04-21 DIAGNOSIS — J302 Other seasonal allergic rhinitis: Secondary | ICD-10-CM | POA: Diagnosis not present

## 2024-04-21 DIAGNOSIS — N3281 Overactive bladder: Secondary | ICD-10-CM | POA: Diagnosis not present

## 2024-04-21 DIAGNOSIS — Z8781 Personal history of (healed) traumatic fracture: Secondary | ICD-10-CM | POA: Diagnosis not present

## 2024-04-21 DIAGNOSIS — N390 Urinary tract infection, site not specified: Secondary | ICD-10-CM | POA: Diagnosis not present

## 2024-04-21 DIAGNOSIS — E782 Mixed hyperlipidemia: Secondary | ICD-10-CM | POA: Diagnosis not present

## 2024-04-21 DIAGNOSIS — Z862 Personal history of diseases of the blood and blood-forming organs and certain disorders involving the immune mechanism: Secondary | ICD-10-CM | POA: Diagnosis not present

## 2024-04-21 DIAGNOSIS — R32 Unspecified urinary incontinence: Secondary | ICD-10-CM | POA: Diagnosis not present

## 2024-04-21 DIAGNOSIS — E559 Vitamin D deficiency, unspecified: Secondary | ICD-10-CM | POA: Diagnosis not present

## 2024-04-21 DIAGNOSIS — M25551 Pain in right hip: Secondary | ICD-10-CM | POA: Diagnosis not present

## 2024-04-21 DIAGNOSIS — R944 Abnormal results of kidney function studies: Secondary | ICD-10-CM | POA: Diagnosis not present

## 2024-05-17 DIAGNOSIS — R350 Frequency of micturition: Secondary | ICD-10-CM | POA: Diagnosis not present

## 2024-05-18 DIAGNOSIS — N390 Urinary tract infection, site not specified: Secondary | ICD-10-CM | POA: Diagnosis not present

## 2024-05-20 ENCOUNTER — Ambulatory Visit: Admitting: Obstetrics and Gynecology

## 2024-06-14 DIAGNOSIS — Z1231 Encounter for screening mammogram for malignant neoplasm of breast: Secondary | ICD-10-CM | POA: Diagnosis not present

## 2024-06-14 DIAGNOSIS — R92323 Mammographic fibroglandular density, bilateral breasts: Secondary | ICD-10-CM | POA: Diagnosis not present

## 2024-06-21 ENCOUNTER — Ambulatory Visit: Admitting: Urology

## 2024-06-22 ENCOUNTER — Encounter: Payer: Self-pay | Admitting: Obstetrics and Gynecology

## 2024-06-22 ENCOUNTER — Ambulatory Visit: Admitting: Obstetrics and Gynecology

## 2024-06-22 VITALS — BP 128/57 | HR 40 | Ht 61.42 in | Wt 162.2 lb

## 2024-06-22 DIAGNOSIS — N390 Urinary tract infection, site not specified: Secondary | ICD-10-CM | POA: Insufficient documentation

## 2024-06-22 DIAGNOSIS — Z466 Encounter for fitting and adjustment of urinary device: Secondary | ICD-10-CM | POA: Diagnosis not present

## 2024-06-22 DIAGNOSIS — N812 Incomplete uterovaginal prolapse: Secondary | ICD-10-CM | POA: Diagnosis not present

## 2024-06-22 DIAGNOSIS — N39490 Overflow incontinence: Secondary | ICD-10-CM

## 2024-06-22 DIAGNOSIS — N393 Stress incontinence (female) (male): Secondary | ICD-10-CM | POA: Diagnosis not present

## 2024-06-22 DIAGNOSIS — R8290 Unspecified abnormal findings in urine: Secondary | ICD-10-CM | POA: Diagnosis not present

## 2024-06-22 DIAGNOSIS — R339 Retention of urine, unspecified: Secondary | ICD-10-CM | POA: Diagnosis not present

## 2024-06-22 DIAGNOSIS — Z48816 Encounter for surgical aftercare following surgery on the genitourinary system: Secondary | ICD-10-CM | POA: Diagnosis not present

## 2024-06-22 DIAGNOSIS — R350 Frequency of micturition: Secondary | ICD-10-CM | POA: Diagnosis not present

## 2024-06-22 DIAGNOSIS — N852 Hypertrophy of uterus: Secondary | ICD-10-CM | POA: Insufficient documentation

## 2024-06-22 LAB — POCT URINALYSIS DIP (CLINITEK)
Bilirubin, UA: NEGATIVE
Glucose, UA: NEGATIVE mg/dL
Ketones, POC UA: NEGATIVE mg/dL
Nitrite, UA: POSITIVE — AB
POC PROTEIN,UA: NEGATIVE
Spec Grav, UA: 1.015 (ref 1.010–1.025)
Urobilinogen, UA: 0.2 U/dL
pH, UA: 6 (ref 5.0–8.0)

## 2024-06-22 MED ORDER — SULFAMETHOXAZOLE-TRIMETHOPRIM 800-160 MG PO TABS: 1.0000 | ORAL_TABLET | Freq: Two times a day (BID) | ORAL | 0 refills | Status: AC

## 2024-06-22 MED ORDER — ESTRADIOL 0.01 % VA CREA: TOPICAL_CREAM | VAGINAL | 11 refills | Status: DC

## 2024-06-22 MED ORDER — FOSFOMYCIN TROMETHAMINE 3 G PO PACK: PACK | ORAL | 5 refills | Status: DC

## 2024-06-22 NOTE — Assessment & Plan Note (Signed)
-   Likely due to prolapse but will have patient undergo urodynamic testing to evaluate bladder function further

## 2024-06-22 NOTE — Progress Notes (Signed)
 New Patient Evaluation and Consultation  Referring Provider: Hyacinth Honey, Shelby PCP: Shelby Norleen PEDLAR, MD Date of Service: 06/22/2024  SUBJECTIVE Chief Complaint: New Patient (Initial Visit) (Shelby Chan is a 89 y.o. female here today here today for urinary incontinence and UTIs)  History of Present Illness: Shelby Chan is a 89 y.o. White or Caucasian female seen in consultation at the request of Shelby Chan for evaluation of recurrent urinary tract infection.    Review of records significant for: 01/08/2024: Admitted for acute metabolic encephalopathy possibly due to COVID and UTI.Shelby Chan Urine culture positive for E. Coli and Aerococcus (resistant to ampicillin, ciprofloxacin and nitrofurantoin). Treated with cephalosporins.   02/01/2024: Admitted for acute UTI, suspected left-sided pyelonephritis.  Treated with Rocephin  and keflex . No urine culture.  Elevated PVR noted at urology visits- PVR >261ml  CT renal stone study 02/01/24 shows inflammatory stranding but no hydronephrosis. Enlarged fibroid uterus noted.    Urinary Symptoms: Leaks urine with going from sitting to standing, with movement to the bathroom, and without sensation Leaks 2-3 time(s) per day.  Started having issues around 2014 but worsened in the last year.  Pad use: 2 pads per day.   Patient is bothered by UI symptoms. Previously had been on myrbetriq but was stopped due to elevated PVR. Gemtesa was ordered at one point but too expensive.   Day time voids 4-5.  Nocturia: 1-2 times per night to void. Voiding dysfunction:  empties bladder well.  Patient does not use a catheter to empty bladder.  When urinating, patient feels a weak stream and difficulty starting urine stream Drinks: 1cup coffee in AM, milk with meals, 16oz water, juice, ensure  UTIs: 4 UTI's in the last year.   Does not currently have pain.  Denies history of blood in urine and kidney or bladder stones Was using vaginal estrogen but not  currently (stopped August)  Pelvic Organ Prolapse Symptoms:                  Patient Denies a feeling of a bulge the vaginal area.   Bowel Symptom: Bowel movements: 1 time(s) per day Stool consistency: soft  Straining: no.  Splinting: no.  Incomplete evacuation: no.  Patient Denies accidental bowel leakage / fecal incontinence Bowel regimen: none   Sexual Function Sexually active: no.    Pelvic Pain Denies pelvic pain   Past Medical History:  Past Medical History:  Diagnosis Date   Arthritis    Complication of anesthesia    High cholesterol    PONV (postoperative nausea and vomiting)      Past Surgical History:   Past Surgical History:  Procedure Laterality Date   CHOLECYSTECTOMY     GLUTEUS MINIMUS REPAIR Right 09/24/2023   Procedure: RIGHT GLUTEUS MEDIUS REPAIR WITH COLLAGEN PATCH AUGMENTATION;  Surgeon: Genelle Standing, MD;  Location: [facility] OR;  Service: Orthopedics;  Laterality: Right;   HIP PINNING,CANNULATED Right 11/07/2021   Procedure: RIGHT HIP PERCUTANEOUS SCREW FIXATION;  Surgeon: Genelle Standing, MD;  Location: [facility] OR;  Service: Orthopedics;  Laterality: Right;   Right leg fracture s/p repair with 2 pins placed     TUBAL LIGATION       Past OB/GYN History: OB History  Gravida Para Term Preterm AB Living  4 4 4   4   SAB IAB Ectopic Multiple Live Births      4    # Outcome Date GA Lbr Len/2nd Weight Sex Type Anes PTL Lv  4 Term  F Vag-Spont   LIV  3 Term     F Vag-Spont   LIV  2 Term     M Vag-Spont   LIV  1 Term     M Vag-Spont   LIV    Menopausal: Denies vaginal bleeding since menopause Any history of abnormal pap smears: no.   Medications: Patient has a current medication list which includes the following prescription(s): aspirin  ec, calcium carbonate-vitamin d , cetirizine, coenzyme q10, [START ON 06/23/2024] estradiol , fosfomycin, centrum silver women 50+, sulfamethoxazole-trimethoprim, and acetaminophen .   Allergies: Patient has no known  allergies.   Social History:  Social History   Tobacco Use   Smoking status: Never   Smokeless tobacco: Never  Vaping Use   Vaping status: Never Used  Substance Use Topics   Alcohol use: Yes    Comment: social   Drug use: Never    Relationship status: widowed Patient lives slone.   Patient is not employed. Regular exercise: Yes: daily walking History of abuse: No  Family History:   Family History  Problem Relation Age of Onset   Heart disease Mother    Bladder Cancer Neg Hx    Renal cancer Neg Hx    Uterine cancer Neg Hx      Review of Systems: Review of Systems  Constitutional:  Positive for malaise/fatigue. Negative for fever and weight loss.  Respiratory:  Negative for cough, shortness of breath and wheezing.   Cardiovascular:  Negative for chest pain, palpitations and leg swelling.  Gastrointestinal:  Negative for abdominal pain and blood in stool.  Genitourinary:  Negative for dysuria.  Musculoskeletal:  Positive for myalgias.  Skin:  Negative for rash.  Neurological:  Negative for dizziness and headaches.  Endo/Heme/Allergies:  Bruises/bleeds easily.  Psychiatric/Behavioral:  Negative for depression. The patient is not nervous/anxious.      OBJECTIVE Physical Exam: Vitals:   06/22/24 1438  BP: (!) 128/57  Pulse: (!) 40  Weight: 162 lb 3.2 oz (73.6 kg)  Height: 5' 1.42 (1.56 m)    Physical Exam Vitals reviewed. Exam conducted with a chaperone present.  Constitutional:      General: She is not in acute distress. Pulmonary:     Effort: Pulmonary effort is normal.  Abdominal:     General: There is no distension.     Palpations: Abdomen is soft.     Tenderness: There is no abdominal tenderness. There is no rebound.  Musculoskeletal:        General: No swelling. Normal range of motion.  Skin:    General: Skin is warm and dry.     Findings: No rash.  Neurological:     Mental Status: She is alert and oriented to person, place, and time.   Psychiatric:        Mood and Affect: Mood normal.        Behavior: Behavior normal.      GU / Detailed Urogynecologic Evaluation:  Pelvic Exam: Normal external female genitalia; Bartholin's and Skene's glands normal in appearance; urethral meatus normal in appearance, no urethral masses or discharge.   CST: positive  Straight Catheterization Procedure for PVR: After verbal consent was obtained from the patient for catheterization to assess bladder emptying and residual volume the urethra and surrounding tissues were prepped with betadine  and an in and out catheterization was performed.  PVR was .  Urine appeared clear yellow. The patient tolerated the procedure well.  Speculum exam reveals normal vaginal mucosa with atrophy. Cervix normal appearance. Uterus irregular  enlargement, 12 week size, retroverted, nontender. Adnexa no mass, fullness, tenderness.    Pelvic floor strength I/V  Pelvic floor musculature: Right levator non-tender, Right obturator non-tender, Left levator non-tender, Left obturator non-tender  POP-Q:   POP-Q  2                                            Aa   2                                           Ba  -5                                              C   2.5                                            Gh  5                                            Pb  8                                            tvl   -2                                            Ap  -2                                            Bp  -8                                              D   #5 iRWS pessary placed and this was slightly too large. #4 iRWS pessary placed. It was comfortable, fit well, and stayed in placed with strong cough, valsalva and bending. The patient demonstrated proper placement and removal with a string tied to the pessary. Lot # [id number]    Rectal Exam:  deferred   Laboratory Results: Lab Results  Component Value Date   COLORU yellow 06/22/2024    CLARITYU cloudy (A) 06/22/2024   GLUCOSEUR negative 06/22/2024   BILIRUBINUR negative 06/22/2024   KETONESU Negative 03/17/2024   SPECGRAV 1.015 06/22/2024   RBCUR trace-intact (A) 06/22/2024   PHUR 6.0 06/22/2024   PROTEINUR 2+ (A) 03/17/2024   UROBILINOGEN 0.2 06/22/2024   LEUKOCYTESUR Small (1+) (A) 06/22/2024    Lab Results  Component Value Date   CREATININE 0.78 02/03/2024  CREATININE 0.77 02/02/2024   CREATININE 0.71 02/01/2024    No results found for: HGBA1C  Lab Results  Component Value Date   HGB 11.8 (L) 02/03/2024     ASSESSMENT AND PLAN Shelby Chan is a 89 y.o. with:  1. Uterovaginal prolapse, incomplete   2. Incomplete bladder emptying   3. Recurrent UTI   4. Overflow incontinence of urine   5. SUI (stress urinary incontinence, female)   6. Enlarged uterus   7. Abnormal urinalysis   8. Urinary frequency     Uterovaginal prolapse, incomplete Assessment & Plan: Stage III anterior, Stage I posterior, Stage I apical prolapse - For treatment of pelvic organ prolapse, we discussed options for management including expectant management, conservative management, and surgical management, such as Kegels, a pessary, pelvic floor physical therapy, and specific surgical procedures. - Fit with a #4 iRWS to help reduce prolapse and hopefully resolve incomplete bladder emptying. We discussed that we will check in a month to ensure pessary is fitting well.    Incomplete bladder emptying Assessment & Plan: - Likely due to prolapse but will have patient undergo urodynamic testing to evaluate bladder function further   Recurrent UTI Assessment & Plan: - Likely due to incomplete bladder emptying.  - Will start on bactrim DS BID x3 days for current abnromal urinalysis - Will send urine for pathnostics molecular testing as last culture showed multiple species and multiple resistances.  - After completion of bactrim, start prophylaxis with Fosfomycin powder q 10 days.  Her daughter will inform us  if this is too expensive, then can base alternative off of culture results.  - Advised to restart vaginal estrogen cream twice a week for prevention.   Orders: -     Sulfamethoxazole-Trimethoprim; Take 1 tablet by mouth 2 (two) times daily for 3 days.  Dispense: 6 tablet; Refill: 0 -     Fosfomycin Tromethamine; Take 1 packet every 10 days. Dissolve packet in 3-4 oz of water and drink immediately.  Dispense: 3 each; Refill: 5 -     Estradiol ; Place 0.5g at night in the vagina twice a week  Dispense: 42.5 g; Refill: 11  Overflow incontinence of urine Assessment & Plan: - Likely due to incomplete emptying but may have a component of OAB as well.  - Can reassess after urodynamic testing.    SUI (stress urinary incontinence, female) Assessment & Plan: - Noted on exam today - Incontinence pessary placed   Enlarged uterus Assessment & Plan: - Uterus enlarged on exam today, possible fibroids based on CT renal stone study.  Will order pelvic MRI to evaluate further  Orders: -     MR PELVIS W WO CONTRAST; Future  Abnormal urinalysis -     Pathnostics Molecular Test; Future -     Sulfamethoxazole-Trimethoprim; Take 1 tablet by mouth 2 (two) times daily for 3 days.  Dispense: 6 tablet; Refill: 0  Urinary frequency -     POCT URINALYSIS DIP (CLINITEK)  Return 1 month for pessary check and for urodynamics   Shelby LOISE Caper, MD   Time spent: I spent 70 minutes dedicated to the care of this patient on the date of this encounter to include pre-visit review of records, face-to-face time with the patient and post visit documentation and ordering medication/ testing.

## 2024-06-22 NOTE — Assessment & Plan Note (Signed)
 Stage III anterior, Stage I posterior, Stage I apical prolapse - For treatment of pelvic organ prolapse, we discussed options for management including expectant management, conservative management, and surgical management, such as Kegels, a pessary, pelvic floor physical therapy, and specific surgical procedures. - Fit with a #4 iRWS to help reduce prolapse and hopefully resolve incomplete bladder emptying. We discussed that we will check in a month to ensure pessary is fitting well.

## 2024-06-22 NOTE — Patient Instructions (Addendum)
 You have a stage 3 (out of 4) prolapse.  We discussed the fact that it is not life threatening but there are several treatment options. For treatment of pelvic organ prolapse, we discussed options for management including expectant management, conservative management, and surgical management, such as Kegels, a pessary, pelvic floor physical therapy, and specific surgical procedures.    You were fit with a #4 ring pessary. You are electing to leave it in place until your follow up visit. If it falls out you can try to put it back in (with the knob facing outwards) or you can leave it out. If it seems to be slipping down you can use your finger to push it back in deeper - the deeper it is, the better fit.  Restart estradiol  cream twice a week in the vagina for UTI prevention.   For current UTI- start bactrim twice a day for 3 days. Then start packet of fosfomycin once every 10 days.   URODYNAMICS (UDS) TEST INFORMATION  IMPORTANT: Please try to arrive with a comfortably full bladder!    What is UDS? Urodynamics is a bladder test used to evaluate how your bladder and urethra (tube you urinate out of) work to help find out the cause of your bladder symptoms and evaluate your bladder function in order to make the best treatment plan for you.   What to expect? A nurse will perform the test and will be with you during the entire exam. First we will have to empty your bladder on a special toilet.  After you have emptied your bladder, very small catheters (plastic tubing) will be placed into your bladder and into your vagina (or rectum). These special small catheters measure pressure to help measure your bladder function.  Your bladder will be gently filled with water and you will be asked to cough and strain at several different points during the test.   You will then be asked to empty your bladder in the special toilet with the catheters in place. Most patients can urinate (pee) easily with the catheters  in place since the catheters are so small. In total this procedure lasts about 45 minutes to 1 hour.  After your test is completed, you will return (or possibly be seen the same day) to review the results, talk about treatment options and make a plan moving forward.

## 2024-06-22 NOTE — Assessment & Plan Note (Signed)
-   Uterus enlarged on exam today, possible fibroids based on CT renal stone study.  Will order pelvic MRI to evaluate further

## 2024-06-22 NOTE — Assessment & Plan Note (Signed)
-   Likely due to incomplete bladder emptying.  - Will start on bactrim DS BID x3 days for current abnromal urinalysis - Will send urine for pathnostics molecular testing as last culture showed multiple species and multiple resistances.  - After completion of bactrim, start prophylaxis with Fosfomycin powder q 10 days. Her daughter will inform us  if this is too expensive, then can base alternative off of culture results.  - Advised to restart vaginal estrogen cream twice a week for prevention.

## 2024-06-22 NOTE — Assessment & Plan Note (Signed)
-   Noted on exam today - Incontinence pessary placed

## 2024-06-22 NOTE — Assessment & Plan Note (Signed)
-   Likely due to incomplete emptying but may have a component of OAB as well.  - Can reassess after urodynamic testing.

## 2024-06-24 LAB — PATHNOSTICS MOLECULAR TEST

## 2024-06-27 ENCOUNTER — Other Ambulatory Visit: Payer: Self-pay | Admitting: Obstetrics and Gynecology

## 2024-06-27 DIAGNOSIS — N390 Urinary tract infection, site not specified: Secondary | ICD-10-CM

## 2024-06-27 MED ORDER — ESTRADIOL 7.5 MCG/24HR VA RING: 1.0000 | VAGINAL_RING | VAGINAL | 3 refills | Status: AC

## 2024-06-27 MED ORDER — NITROFURANTOIN MONOHYD MACRO 100 MG PO CAPS: 100.0000 mg | ORAL_CAPSULE | Freq: Two times a day (BID) | ORAL | 0 refills | Status: AC

## 2024-06-27 NOTE — Progress Notes (Signed)
 Called and spoke to patient's daughter who is her emergency contact as patient lives at home in a medical facility. E-string sent to pharamacy for patient to bring to urodynamics testing. Also sent in Macrobid to cover pathnostics positive culture. This will be scanned into chart.

## 2024-06-28 ENCOUNTER — Telehealth: Payer: Self-pay

## 2024-06-28 NOTE — Telephone Encounter (Signed)
 Fosfomycin Tromethamine 3 Gram oral pack approval from [employer] Cover[employer] Start date: 06/22/2024 Coverage End Date: 08/31/2024

## 2024-06-29 ENCOUNTER — Ambulatory Visit: Payer: Self-pay | Admitting: Obstetrics and Gynecology

## 2024-06-29 ENCOUNTER — Ambulatory Visit (HOSPITAL_COMMUNITY)
Admission: RE | Admit: 2024-06-29 | Discharge: 2024-06-29 | Disposition: A | Discharge status: Home / Self Care | Source: Ambulatory Visit | Attending: Obstetrics and Gynecology | Admitting: Obstetrics and Gynecology

## 2024-06-29 DIAGNOSIS — D252 Subserosal leiomyoma of uterus: Secondary | ICD-10-CM | POA: Diagnosis not present

## 2024-06-29 DIAGNOSIS — R935 Abnormal findings on diagnostic imaging of other abdominal regions, including retroperitoneum: Secondary | ICD-10-CM

## 2024-06-29 DIAGNOSIS — K5730 Diverticulosis of large intestine without perforation or abscess without bleeding: Secondary | ICD-10-CM | POA: Diagnosis not present

## 2024-06-29 DIAGNOSIS — N852 Hypertrophy of uterus: Secondary | ICD-10-CM | POA: Insufficient documentation

## 2024-06-29 MED ORDER — GADOBUTROL 1 MMOL/ML IV SOLN
7.0000 mL | Freq: Once | INTRAVENOUS | Status: AC | PRN
Start: 2024-06-29 — End: 2024-06-29
  Administered 2024-06-29: 7 mL via INTRAVENOUS

## 2024-06-30 ENCOUNTER — Telehealth: Payer: Self-pay | Admitting: *Deleted

## 2024-06-30 NOTE — Telephone Encounter (Signed)
 Spoke with the patient's daughter  regarding the referral to GYN oncology. Patient scheduled as new patient with Dr Viktoria on 11/13 at 9 am. Patient given an arrival time of 8:30 am.  Explained to the patient the the doctor will perform a pelvic exam at this visit. Patient given the policy that only one visitor allowed and that visitor must be over 16 yrs are allowed in the Cancer Center. Patient given the address/phone number for the clinic and that the center offers free valet service. Patient aware that masks optional.

## 2024-07-04 ENCOUNTER — Encounter: Payer: Self-pay | Admitting: Radiology

## 2024-07-12 ENCOUNTER — Encounter: Payer: Self-pay | Admitting: Gynecologic Oncology

## 2024-07-13 NOTE — H&P (View-Only) (Signed)
 GYNECOLOGIC ONCOLOGY NEW PATIENT CONSULTATION   Patient Name: Shelby Chan  Patient Age: 89 y.o. Date of Service: 07/14/24 Referring Provider: Marilynne Browning MD  Primary Care Provider: Shona Norleen PEDLAR, MD Consulting Provider: Comer Dollar, MD   Assessment/Plan:  Postmenopausal patient with suspected hematometra.  I spent some time today reviewing with the patient and her daughter recent workup.  We looked at MRI images together as well as models of normal pelvic organs.  Reviewed findings of what appears to be fluid-filled endometrial cavity and what is likely a uterine fibroid.  Discussed that there are no features on MRI that raise significant concern for precancerous or cancerous process however fluid-filled endometrial cavity is still an abnormal finding in a postmenopausal patient.  This can arise in the setting of cervical stenosis.  With regard to her urinary symptoms, notably her difficulty emptying and incontinence, her enlarged uterus may be contributing.  I reviewed that treatment of endometrial fluid may improve her symptoms but there is a possibility it does not change the symptoms much.  In terms of treatment options, we discussed close surveillance versus diagnostic and possibly therapeutic procedure.  I offered attempt at cervical dilation and drainage of the uterus in clinic.  I also offered procedure under anesthesia including an exam, cervical dilation, hysteroscopy, and endometrial sampling.  We would also plan to have ultrasound available at the time of the procedure.  After discussion, the patient and her daughter wish to proceed with procedure in the operating room.  We discussed the plan for exam under anesthesia, cervical dilation possibly under ultrasound guidance, hysteroscopy, and endometrial sampling. The risks of surgery were discussed in detail and she understands these to include infection; injury to adjacent organs such as bowel, bladder, blood vessels,  ureters and nerves; bleeding which may require blood transfusion; anesthesia risk; thromboembolic events; possible death; unforeseen complications; possible need for re-exploration; medical complications such as heart attack, stroke, pleural effusion and pneumonia. The patient will receive DVT and antibiotic prophylaxis as indicated. She voiced a clear understanding. She had the opportunity to ask questions. Perioperative instructions were reviewed with her. Prescriptions for post-op medications were sent to her pharmacy of choice.  My office will reach out to her primary care provider for clearance.  A copy of this note was sent to the patient's referring provider.   55 minutes of total time was spent for this patient encounter, including preparation, face-to-face counseling with the patient and coordination of care, and documentation of the encounter.  Comer Dollar, MD  Division of Gynecologic Oncology  Department of Obstetrics and Gynecology  [facility]  ___________________________________________  Chief Complaint: Chief Complaint  Patient presents with   abnormal US     History of Present Illness:  Shelby Chan is a 89 y.o. y.o. female who is seen in consultation at the request of Dr. Browning Marilynne for an evaluation of a pelvic mass.  The patient was admitted in May after presenting with altered mental status and a fall.  She was admitted for a urinary tract infection and started on IV antibiotics.  Was also found to be COVID-positive at that time and was treated with Paxlovid .  She was admited again in June with pyelonephritis. During that admission, she under CT renal stone study that showed moderate left perinephric and peripelvic inflammatory stranding. Uterus noted to be lobulated and enlarged. She saw Dr. Marilynne in December for her history of UTIs and urinary incontinence. Given enlarged uterus on exam, pelvic  MRI was ordered.   MRI pelvic  06/29/24: 1. There is a large cystic lesion in the expected location of the uterus measuring 6.0 x 7.4 cm with markedly thinned myometrium. 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 There is a small dependent lobulated 1.1 x 1.6 cm area along the dependent portion however, which does not show enhancement on the postcontrast images and favored to represent proteinaceous/hemorrhagic contents. 2. There is an exophytic, enhancing, subserosal/intramural 4.0 x 5.4 cm lesion on the right side of the uterus, which is favored to represent a leiomyoma. There are additional smaller leiomyomas. 3. There is incompletely characterized approximately 2.3 x 4.3 cm, T1 hyperintense, nonenhancing area centered within the subtrochanteric proximal femur, medullary cavity. This corresponds to a lytic area on the prior CT scan. This may represent an intra osseous lipoma however, further evaluation with contrast-enhanced MRI is recommended to exclude underlying neoplastic process. 4. There is a T2 hyperintense ill-defined lesion in the left ala of sacrum, which is also incompletely characterized on the current exam and may also represent lipoma.  Patient comes in today with her daughter.  Patient lives in an independent living facility but spends a fair amount of time with her daughter and family.  Patient has a long history of incontinence for at least the last 10 years that has worsened with time.  This incontinence has stopped her from doing as much.  She endorses often not having the sensation that she needs to void and then having incontinence when she stands up.  She now is using a pessary which has improved her leaking, especially at night.  She was prescribed vaginal estrogen, which she stopped using and plan is for her to have an Estring  put in at her next visit with urogynecology.  Patient endorses overall feeling well.  Denies any abdominal or pelvic pain.  Endorses a good  appetite without nausea or emesis.  Reports normal back pain.  Maintaining weight.  PAST MEDICAL HISTORY:  Past Medical History:  Diagnosis Date   Arthritis    Complication of anesthesia    High cholesterol    PONV (postoperative nausea and vomiting)      PAST SURGICAL HISTORY:  Past Surgical History:  Procedure Laterality Date   CHOLECYSTECTOMY     GLUTEUS MINIMUS REPAIR Right 09/24/2023   Procedure: RIGHT GLUTEUS MEDIUS REPAIR WITH COLLAGEN PATCH AUGMENTATION;  Surgeon: Genelle Standing, MD;  Location: [facility] OR;  Service: Orthopedics;  Laterality: Right;   HIP PINNING,CANNULATED Right 11/07/2021   Procedure: RIGHT HIP PERCUTANEOUS SCREW FIXATION;  Surgeon: Genelle Standing, MD;  Location: [facility] OR;  Service: Orthopedics;  Laterality: Right;   Right leg fracture s/p repair with 2 pins placed     TUBAL LIGATION      OB/GYN HISTORY:  OB History  Gravida Para Term Preterm AB Living  4 4 4   4   SAB IAB Ectopic Multiple Live Births      4    # Outcome Date GA Lbr Len/2nd Weight Sex Type Anes PTL Lv  4 Term     F Vag-Spont   LIV  3 Term     F Vag-Spont   LIV  2 Term     M Vag-Spont   LIV  1 Term     M Vag-Spont   LIV    No LMP recorded. Patient is postmenopausal.  Age at menarche: 69  Age at menopause: 77 Hx of HRT: denies Hx of STDs: denies Last pap:  unsure History of abnormal pap smears: denies  SCREENING STUDIES:  Last mammogram: 2025  Last colonoscopy: approximately 5 years ago  MEDICATIONS: Outpatient Encounter Medications as of 07/14/2024  Medication Sig   acetaminophen  (TYLENOL ) 650 MG CR tablet Take 1,300 mg by mouth every 8 (eight) hours as needed for pain.   aspirin  EC 81 MG tablet Take 1 tablet (81 mg total) by mouth 2 (two) times daily. Continue taking once a day after a month   Calcium Carbonate-Vitamin D  (CALTRATE 600+D PO) Take 1 tablet by mouth daily.   cetirizine (ZYRTEC) 10 MG tablet Take 10 mg by mouth daily.   Coenzyme Q10 100 MG capsule Take 100 mg by  mouth daily.   Multiple Vitamins-Minerals (CENTRUM SILVER WOMEN 50+) TABS Take 1 tablet by mouth daily with breakfast.   estradiol  (ESTRACE ) 0.01 % CREA vaginal cream Place 0.5g at night in the vagina twice a week (Patient not taking: Reported on 07/12/2024)   estradiol  (ESTRING ) 7.5 MCG/24HR vaginal ring Place 1 each vaginally every 3 (three) months.   [DISCONTINUED] fosfomycin (MONUROL) 3 g PACK Take 1 packet every 10 days. Dissolve packet in 3-4 oz of water and drink immediately. (Patient not taking: Reported on 07/12/2024)   No facility-administered encounter medications on file as of 07/14/2024.    ALLERGIES:  No Known Allergies   FAMILY HISTORY:  Family History  Problem Relation Age of Onset   Heart disease Mother    Lymphoma Sister    Leukemia Son    Bladder Cancer Neg Hx    Renal cancer Neg Hx    Uterine cancer Neg Hx    Breast cancer Neg Hx    Ovarian cancer Neg Hx    Colon cancer Neg Hx    Pancreatic cancer Neg Hx      SOCIAL HISTORY:  Social Connections: Moderately Integrated (02/01/2024)   Social Connection and Isolation Panel    Frequency of Communication with Friends and Family: More than three times a week    Frequency of Social Gatherings with Friends and Family: More than three times a week    Attends Religious Services: More than 4 times per year    Active Member of [employer] or Organizations: No    Attends Banker Meetings: 1 to 4 times per year    Marital Status: Widowed    REVIEW OF SYSTEMS:  + Fatigue, frequency, incontinence, joint pain, back pain, problems with walking sometimes Denies appetite changes, fevers, chills, unexplained weight changes. Denies hearing loss, neck lumps or masses, mouth sores, ringing in ears or voice changes. Denies cough or wheezing.  Denies shortness of breath. Denies chest pain or palpitations. Denies leg swelling. Denies abdominal distention, pain, blood in stools, constipation, diarrhea, nausea, vomiting, or  early satiety. Denies pain with intercourse, dysuria or hematuria. Denies hot flashes, pelvic pain, vaginal bleeding or vaginal discharge.   Denies muscle pain/cramps. Denies itching, rash, or wounds. Denies dizziness, headaches, numbness or seizures. Denies swollen lymph nodes or glands, denies easy bruising or bleeding. Denies anxiety, depression, confusion, or decreased concentration.  Physical Exam:  Vital Signs for this encounter:  Blood pressure (!) 118/55, pulse 86, temperature 98 F (36.7 C), temperature source Oral, resp. rate 19, height 5' 2 (1.575 m), weight 161 lb 12.8 oz (73.4 kg), SpO2 99%. Body mass index is 29.59 kg/m. General: Alert, oriented, no acute distress.  HEENT: Normocephalic, atraumatic. Sclera anicteric.  Chest: Clear to auscultation bilaterally. No wheezes, rhonchi, or rales. Cardiovascular: Regular rate and rhythm,  no murmurs, rubs, or gallops.  Abdomen: Normoactive bowel sounds. Soft, nondistended, nontender to palpation. No masses or hepatosplenomegaly appreciated. No palpable fluid wave.  Well-healed midline infraumbilical incision. Extremities: Grossly normal range of motion. Warm, well perfused. No edema bilaterally.  Skin: No rashes or lesions.  Lymphatics: No cervical, supraclavicular, or inguinal adenopathy.  GU: Deferred.  LABORATORY AND RADIOLOGIC DATA:  Outside medical records were reviewed to synthesize the above history, along with the history and physical obtained during the visit.   Lab Results  Component Value Date   WBC 5.8 02/03/2024   HGB 11.8 (L) 02/03/2024   HCT 37.7 02/03/2024   PLT 212 02/03/2024   GLUCOSE 102 (H) 02/03/2024   ALT 29 02/01/2024   AST 30 02/01/2024   NA 138 02/03/2024   K 3.9 02/03/2024   CL 108 02/03/2024   CREATININE 0.78 02/03/2024   BUN 11 02/03/2024   CO2 21 (L) 02/03/2024   TSH 2.026 02/01/2024   INR 1.2 02/01/2024

## 2024-07-13 NOTE — Progress Notes (Signed)
 GYNECOLOGIC ONCOLOGY NEW PATIENT CONSULTATION   Patient Name: Shelby Chan  Patient Age: 89 y.o. Date of Service: 07/14/24 Referring Provider: Marilynne Browning MD  Primary Care Provider: Shona Norleen PEDLAR, MD Consulting Provider: Comer Dollar, MD   Assessment/Plan:  Postmenopausal patient with suspected hematometra.  I spent some time today reviewing with the patient and her daughter recent workup.  We looked at MRI images together as well as models of normal pelvic organs.  Reviewed findings of what appears to be fluid-filled endometrial cavity and what is likely a uterine fibroid.  Discussed that there are no features on MRI that raise significant concern for precancerous or cancerous process however fluid-filled endometrial cavity is still an abnormal finding in a postmenopausal patient.  This can arise in the setting of cervical stenosis.  With regard to her urinary symptoms, notably her difficulty emptying and incontinence, her enlarged uterus may be contributing.  I reviewed that treatment of endometrial fluid may improve her symptoms but there is a possibility it does not change the symptoms much.  In terms of treatment options, we discussed close surveillance versus diagnostic and possibly therapeutic procedure.  I offered attempt at cervical dilation and drainage of the uterus in clinic.  I also offered procedure under anesthesia including an exam, cervical dilation, hysteroscopy, and endometrial sampling.  We would also plan to have ultrasound available at the time of the procedure.  After discussion, the patient and her daughter wish to proceed with procedure in the operating room.  We discussed the plan for exam under anesthesia, cervical dilation possibly under ultrasound guidance, hysteroscopy, and endometrial sampling. The risks of surgery were discussed in detail and she understands these to include infection; injury to adjacent organs such as bowel, bladder, blood vessels,  ureters and nerves; bleeding which may require blood transfusion; anesthesia risk; thromboembolic events; possible death; unforeseen complications; possible need for re-exploration; medical complications such as heart attack, stroke, pleural effusion and pneumonia. The patient will receive DVT and antibiotic prophylaxis as indicated. She voiced a clear understanding. She had the opportunity to ask questions. Perioperative instructions were reviewed with her. Prescriptions for post-op medications were sent to her pharmacy of choice.  My office will reach out to her primary care provider for clearance.  A copy of this note was sent to the patient's referring provider.   55 minutes of total time was spent for this patient encounter, including preparation, face-to-face counseling with the patient and coordination of care, and documentation of the encounter.  Comer Dollar, MD  Division of Gynecologic Oncology  Department of Obstetrics and Gynecology  [facility]  ___________________________________________  Chief Complaint: Chief Complaint  Patient presents with   abnormal US     History of Present Illness:  Shelby Chan is a 89 y.o. y.o. female who is seen in consultation at the request of Dr. Browning Marilynne for an evaluation of a pelvic mass.  The patient was admitted in May after presenting with altered mental status and a fall.  She was admitted for a urinary tract infection and started on IV antibiotics.  Was also found to be COVID-positive at that time and was treated with Paxlovid .  She was admited again in June with pyelonephritis. During that admission, she under CT renal stone study that showed moderate left perinephric and peripelvic inflammatory stranding. Uterus noted to be lobulated and enlarged. She saw Dr. Marilynne in December for her history of UTIs and urinary incontinence. Given enlarged uterus on exam, pelvic  MRI was ordered.   MRI pelvic  06/29/24: 1. There is a large cystic lesion in the expected location of the uterus measuring 6.0 x 7.4 cm with markedly thinned myometrium. 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 There is a small dependent lobulated 1.1 x 1.6 cm area along the dependent portion however, which does not show enhancement on the postcontrast images and favored to represent proteinaceous/hemorrhagic contents. 2. There is an exophytic, enhancing, subserosal/intramural 4.0 x 5.4 cm lesion on the right side of the uterus, which is favored to represent a leiomyoma. There are additional smaller leiomyomas. 3. There is incompletely characterized approximately 2.3 x 4.3 cm, T1 hyperintense, nonenhancing area centered within the subtrochanteric proximal femur, medullary cavity. This corresponds to a lytic area on the prior CT scan. This may represent an intra osseous lipoma however, further evaluation with contrast-enhanced MRI is recommended to exclude underlying neoplastic process. 4. There is a T2 hyperintense ill-defined lesion in the left ala of sacrum, which is also incompletely characterized on the current exam and may also represent lipoma.  Patient comes in today with her daughter.  Patient lives in an independent living facility but spends a fair amount of time with her daughter and family.  Patient has a long history of incontinence for at least the last 10 years that has worsened with time.  This incontinence has stopped her from doing as much.  She endorses often not having the sensation that she needs to void and then having incontinence when she stands up.  She now is using a pessary which has improved her leaking, especially at night.  She was prescribed vaginal estrogen, which she stopped using and plan is for her to have an Estring  put in at her next visit with urogynecology.  Patient endorses overall feeling well.  Denies any abdominal or pelvic pain.  Endorses a good  appetite without nausea or emesis.  Reports normal back pain.  Maintaining weight.  PAST MEDICAL HISTORY:  Past Medical History:  Diagnosis Date   Arthritis    Complication of anesthesia    High cholesterol    PONV (postoperative nausea and vomiting)      PAST SURGICAL HISTORY:  Past Surgical History:  Procedure Laterality Date   CHOLECYSTECTOMY     GLUTEUS MINIMUS REPAIR Right 09/24/2023   Procedure: RIGHT GLUTEUS MEDIUS REPAIR WITH COLLAGEN PATCH AUGMENTATION;  Surgeon: Genelle Standing, MD;  Location: [facility] OR;  Service: Orthopedics;  Laterality: Right;   HIP PINNING,CANNULATED Right 11/07/2021   Procedure: RIGHT HIP PERCUTANEOUS SCREW FIXATION;  Surgeon: Genelle Standing, MD;  Location: [facility] OR;  Service: Orthopedics;  Laterality: Right;   Right leg fracture s/p repair with 2 pins placed     TUBAL LIGATION      OB/GYN HISTORY:  OB History  Gravida Para Term Preterm AB Living  4 4 4   4   SAB IAB Ectopic Multiple Live Births      4    # Outcome Date GA Lbr Len/2nd Weight Sex Type Anes PTL Lv  4 Term     F Vag-Spont   LIV  3 Term     F Vag-Spont   LIV  2 Term     M Vag-Spont   LIV  1 Term     M Vag-Spont   LIV    No LMP recorded. Patient is postmenopausal.  Age at menarche: 69  Age at menopause: 77 Hx of HRT: denies Hx of STDs: denies Last pap:  unsure History of abnormal pap smears: denies  SCREENING STUDIES:  Last mammogram: 2025  Last colonoscopy: approximately 5 years ago  MEDICATIONS: Outpatient Encounter Medications as of 07/14/2024  Medication Sig   acetaminophen  (TYLENOL ) 650 MG CR tablet Take 1,300 mg by mouth every 8 (eight) hours as needed for pain.   aspirin  EC 81 MG tablet Take 1 tablet (81 mg total) by mouth 2 (two) times daily. Continue taking once a day after a month   Calcium Carbonate-Vitamin D  (CALTRATE 600+D PO) Take 1 tablet by mouth daily.   cetirizine (ZYRTEC) 10 MG tablet Take 10 mg by mouth daily.   Coenzyme Q10 100 MG capsule Take 100 mg by  mouth daily.   Multiple Vitamins-Minerals (CENTRUM SILVER WOMEN 50+) TABS Take 1 tablet by mouth daily with breakfast.   estradiol  (ESTRACE ) 0.01 % CREA vaginal cream Place 0.5g at night in the vagina twice a week (Patient not taking: Reported on 07/12/2024)   estradiol  (ESTRING ) 7.5 MCG/24HR vaginal ring Place 1 each vaginally every 3 (three) months.   [DISCONTINUED] fosfomycin (MONUROL) 3 g PACK Take 1 packet every 10 days. Dissolve packet in 3-4 oz of water and drink immediately. (Patient not taking: Reported on 07/12/2024)   No facility-administered encounter medications on file as of 07/14/2024.    ALLERGIES:  No Known Allergies   FAMILY HISTORY:  Family History  Problem Relation Age of Onset   Heart disease Mother    Lymphoma Sister    Leukemia Son    Bladder Cancer Neg Hx    Renal cancer Neg Hx    Uterine cancer Neg Hx    Breast cancer Neg Hx    Ovarian cancer Neg Hx    Colon cancer Neg Hx    Pancreatic cancer Neg Hx      SOCIAL HISTORY:  Social Connections: Moderately Integrated (02/01/2024)   Social Connection and Isolation Panel    Frequency of Communication with Friends and Family: More than three times a week    Frequency of Social Gatherings with Friends and Family: More than three times a week    Attends Religious Services: More than 4 times per year    Active Member of [employer] or Organizations: No    Attends Banker Meetings: 1 to 4 times per year    Marital Status: Widowed    REVIEW OF SYSTEMS:  + Fatigue, frequency, incontinence, joint pain, back pain, problems with walking sometimes Denies appetite changes, fevers, chills, unexplained weight changes. Denies hearing loss, neck lumps or masses, mouth sores, ringing in ears or voice changes. Denies cough or wheezing.  Denies shortness of breath. Denies chest pain or palpitations. Denies leg swelling. Denies abdominal distention, pain, blood in stools, constipation, diarrhea, nausea, vomiting, or  early satiety. Denies pain with intercourse, dysuria or hematuria. Denies hot flashes, pelvic pain, vaginal bleeding or vaginal discharge.   Denies muscle pain/cramps. Denies itching, rash, or wounds. Denies dizziness, headaches, numbness or seizures. Denies swollen lymph nodes or glands, denies easy bruising or bleeding. Denies anxiety, depression, confusion, or decreased concentration.  Physical Exam:  Vital Signs for this encounter:  Blood pressure (!) 118/55, pulse 86, temperature 98 F (36.7 C), temperature source Oral, resp. rate 19, height 5' 2 (1.575 m), weight 161 lb 12.8 oz (73.4 kg), SpO2 99%. Body mass index is 29.59 kg/m. General: Alert, oriented, no acute distress.  HEENT: Normocephalic, atraumatic. Sclera anicteric.  Chest: Clear to auscultation bilaterally. No wheezes, rhonchi, or rales. Cardiovascular: Regular rate and rhythm,  no murmurs, rubs, or gallops.  Abdomen: Normoactive bowel sounds. Soft, nondistended, nontender to palpation. No masses or hepatosplenomegaly appreciated. No palpable fluid wave.  Well-healed midline infraumbilical incision. Extremities: Grossly normal range of motion. Warm, well perfused. No edema bilaterally.  Skin: No rashes or lesions.  Lymphatics: No cervical, supraclavicular, or inguinal adenopathy.  GU: Deferred.  LABORATORY AND RADIOLOGIC DATA:  Outside medical records were reviewed to synthesize the above history, along with the history and physical obtained during the visit.   Lab Results  Component Value Date   WBC 5.8 02/03/2024   HGB 11.8 (L) 02/03/2024   HCT 37.7 02/03/2024   PLT 212 02/03/2024   GLUCOSE 102 (H) 02/03/2024   ALT 29 02/01/2024   AST 30 02/01/2024   NA 138 02/03/2024   K 3.9 02/03/2024   CL 108 02/03/2024   CREATININE 0.78 02/03/2024   BUN 11 02/03/2024   CO2 21 (L) 02/03/2024   TSH 2.026 02/01/2024   INR 1.2 02/01/2024

## 2024-07-14 ENCOUNTER — Encounter: Payer: Self-pay | Admitting: Gynecologic Oncology

## 2024-07-14 ENCOUNTER — Telehealth: Payer: Self-pay | Admitting: *Deleted

## 2024-07-14 ENCOUNTER — Inpatient Hospital Stay: Admitting: Gynecologic Oncology

## 2024-07-14 ENCOUNTER — Inpatient Hospital Stay: Attending: Gynecologic Oncology | Admitting: Gynecologic Oncology

## 2024-07-14 VITALS — BP 118/55 | HR 86 | Temp 98.0°F | Resp 19 | Ht 62.0 in | Wt 161.8 lb

## 2024-07-14 DIAGNOSIS — N852 Hypertrophy of uterus: Secondary | ICD-10-CM | POA: Diagnosis not present

## 2024-07-14 DIAGNOSIS — M1990 Unspecified osteoarthritis, unspecified site: Secondary | ICD-10-CM | POA: Diagnosis not present

## 2024-07-14 DIAGNOSIS — N857 Hematometra: Secondary | ICD-10-CM | POA: Insufficient documentation

## 2024-07-14 DIAGNOSIS — E7800 Pure hypercholesterolemia, unspecified: Secondary | ICD-10-CM | POA: Insufficient documentation

## 2024-07-14 DIAGNOSIS — N3281 Overactive bladder: Secondary | ICD-10-CM | POA: Diagnosis not present

## 2024-07-14 DIAGNOSIS — N393 Stress incontinence (female) (male): Secondary | ICD-10-CM

## 2024-07-14 DIAGNOSIS — R32 Unspecified urinary incontinence: Secondary | ICD-10-CM | POA: Diagnosis not present

## 2024-07-14 DIAGNOSIS — R339 Retention of urine, unspecified: Secondary | ICD-10-CM | POA: Diagnosis not present

## 2024-07-14 DIAGNOSIS — Z79890 Hormone replacement therapy (postmenopausal): Secondary | ICD-10-CM | POA: Diagnosis not present

## 2024-07-14 DIAGNOSIS — Z7982 Long term (current) use of aspirin: Secondary | ICD-10-CM | POA: Diagnosis not present

## 2024-07-14 DIAGNOSIS — D259 Leiomyoma of uterus, unspecified: Secondary | ICD-10-CM

## 2024-07-14 NOTE — Progress Notes (Signed)
 Patient here for a consult with Dr. Viktoria and for a pre-operative appointment. She is scheduled for a dilation and curettage of the uterus with hysteroscopy and myosure/ultrasound.  The surgery was discussed in detail.  See after visit summary for additional details.      Discussed post-op pain management in detail including the aspects of the enhanced recovery pathway. We discussed the use of tylenol  post-op and to monitor for a maximum of 4,000 mg in a 24 hour period. Discussed bowel regimen in detail.     Discussed the use of SCDs and measures to take at home to prevent DVT including frequent mobility.  Reportable signs and symptoms of DVT discussed. Post-operative instructions discussed and expectations for after surgery. Incisional care discussed as well including reportable signs and symptoms including erythema, drainage, wound separation.     30 minutes spent with the patient.  Verbalizing understanding of material discussed. No needs or concerns voiced at the end of the visit.   Advised patient to call for any needs.  Advised that her post-operative medications had been prescribed and could be picked up at any time.    This appointment is included in the global surgical bundle as pre-operative teaching and has no charge.

## 2024-07-14 NOTE — Telephone Encounter (Signed)
 Per Dr Viktoria fax surgical optimization form and records to the patient's PCP office (Dr Shona [phone])

## 2024-07-14 NOTE — Patient Instructions (Signed)
 Preparing for your Surgery   Plan for surgery with Dr. Comer Dollar at [facility]. You will be scheduled for dilation and curettage of the uterus with hysteroscopy and myosure/ultrasound.    Pre-operative Testing -You will receive a phone call from presurgical testing at [facility] to discuss surgery instructions and arrange for lab work if needed.   -Bring your insurance card, copy of an advanced directive if applicable, medication list.   -You should not be taking blood thinners or aspirin  at least ten days prior to surgery unless instructed by your surgeon.   -Do not take supplements such as fish oil (omega 3), red yeast rice, turmeric before your surgery. You want to avoid medications with aspirin  in them including headache powders such as BC or Goody's), Excedrin migraine.   Day Before Surgery at Home -You will be advised you can have clear liquids up until 3 hours before your surgery.     Your role in recovery Your role is to become active as soon as directed by your doctor, while still giving yourself time to heal.  Rest when you feel tired. You will be asked to do the following in order to speed your recovery:   - Cough and breathe deeply. This helps to clear and expand your lungs and can prevent pneumonia after surgery.  - STAY ACTIVE WHEN YOU GET HOME. Do mild physical activity. Walking or moving your legs help your circulation and body functions return to normal. Do not try to get up or walk alone the first time after surgery.   -If you develop swelling on one leg or the other, pain in the back of your leg, redness/warmth in one of your legs, please call the office or go to the Emergency Room to have a doppler to rule out a blood clot. For shortness of breath, chest pain-seek care in the Emergency Room as soon as possible. - Actively manage your pain. Managing your pain lets you move in comfort. We will ask you to rate your pain on a scale of zero to 10. It is  your responsibility to tell your doctor or nurse where and how much you hurt so your pain can be treated.   Special Considerations -Your final pathology results from surgery should be available around one week after surgery and the results will be relayed to you when available.   -FMLA forms can be faxed to [phone] and please allow 5-7 business days for completion.   Pain Management After Surgery -Make sure that you have Tylenol  and Ibuprofen at home IF YOU ARE ABLE TO TAKE THESE MEDICATIONS to use on a regular basis after surgery for pain control.     Bowel Regimen -It is important to prevent constipation and drink adequate amounts of liquids. You can use a laxative as needed for constipation.   Risks of Surgery Risks of surgery are low but include bleeding, infection, damage to surrounding structures, re-operation, blood clots, and very rarely death.   AFTER SURGERY INSTRUCTIONS   Return to work:  1-2 days if applicable   Activity: 1. Be up and out of the bed during the day.  Take a nap if needed.  You may walk up steps but be careful and use the hand rail.  Stair climbing will tire you more than you think, you may need to stop part way and rest.    2. No lifting or straining for 2 weeks over 10 pounds. No pushing, pulling, straining for 2  weeks.   3. No driving for minimum 24 hours after surgery.  Do not drive if you are taking narcotic pain medicine and make sure that your reaction time has returned.    4. You can shower as soon as the next day after surgery. Shower daily. No tub baths or submerging your body in water until cleared by your surgeon (2 weeks minimum). If you have the soap that was given to you by pre-surgical testing that was used before surgery, you do not need to use it afterwards because this can irritate your incisions.    5. No sexual activity and nothing in the vagina for 2 weeks.   6. You may experience vaginal spotting and discharge after surgery.  The  spotting is normal but if you experience heavy bleeding, call our office.   7. Take Tylenol  and ibuprofen for pain if you are able to take these medications for discomfort as needed.     Diet: 1. Low sodium Heart Healthy Diet is recommended but you are cleared to resume your normal (before surgery) diet after your procedure.   2. It is safe to use a laxative, such as Miralax  or Colace, if you have difficulty moving your bowels.    Wound Care: 1. Keep clean and dry.  Shower daily.   Reasons to call the Doctor: Fever - Oral temperature greater than 100.4 degrees Fahrenheit Foul-smelling vaginal discharge Difficulty urinating Nausea and vomiting Increased pain at the site of the incision that is unrelieved with pain medicine. Difficulty breathing with or without chest pain New calf pain especially if only on one side Sudden, continuing increased vaginal bleeding with or without clots.   Contacts: For questions or concerns you should contact:   Dr. Comer Dollar at [phone]   Eleanor Epps, NP at [phone]   After Hours: call [phone] and have the GYN Oncologist paged/contacted (after 5 pm or on the weekends).   Messages sent via mychart are for non-urgent matters and are not responded to after hours so for urgent needs, please call the after hours number.

## 2024-07-15 ENCOUNTER — Telehealth: Payer: Self-pay

## 2024-07-15 ENCOUNTER — Other Ambulatory Visit (HOSPITAL_COMMUNITY): Payer: Self-pay | Admitting: Gynecologic Oncology

## 2024-07-15 DIAGNOSIS — R1900 Intra-abdominal and pelvic swelling, mass and lump, unspecified site: Secondary | ICD-10-CM

## 2024-07-15 NOTE — Telephone Encounter (Signed)
 Per Eleanor Epps NP, I reached out to Ms. Vanes and spoke to her daughter, Elvie Chute [facility]), with possible surgery date of 11/26 or 12/3 as a back up date.  They have chosen 11/26 for surgery with Dr. Viktoria. Elvie is aware they will get a call from [facility] in regards to scheduling a pre-admit testing appointment. She voiced an understanding

## 2024-07-15 NOTE — Telephone Encounter (Signed)
-----   Message from Eleanor JONETTA Epps sent at 07/15/2024 10:51 AM EST ----- Please call and see if she would like to have the D&C on 11/26. Thank you. Dec 3 is back up option

## 2024-07-15 NOTE — Telephone Encounter (Signed)
 Received PCP clearance.

## 2024-07-21 NOTE — Progress Notes (Addendum)
 Date of COVID positive in last 90 days:  No  PCP - Norleen Hurst, MD Cardiologist - N/A  Chest x-ray - 1V 02-01-24 Epic EKG - 02-01-24 Epic Stress Test - N/A ECHO - N/A Cardiac Cath - N/A Pacemaker/ICD device last checked:N/A Spinal Cord Stimulator:N/A  Bowel Prep - N/A  Sleep Study - N/A CPAP -   Fasting Blood Sugar - N/A Checks Blood Sugar _____ times a day  Last dose of GLP1 agonist-  N/A GLP1 instructions:  Do not take after     Last dose of SGLT-2 inhibitors-  N/A SGLT-2 instructions:  Do not take after    Blood Thinner Instructions: N/A Last dose:   Time: Aspirin  Instructions:  ASA 81 Last Dose: 07-14-24  Activity level:  Can go up a flight of stairs with some assistance due to joint pain.  Able to perform activities of daily living without stopping and without symptoms of chest pain or shortness of breath.  Patient lives in independent living at [facility].    Anesthesia review: N/A  Patient denies shortness of breath, fever, cough and chest pain at PAT appointment  Patient verbalized understanding of instructions that were given to them at the PAT appointment. Patient was also instructed that they will need to review over the PAT instructions again at home before surgery.

## 2024-07-21 NOTE — Patient Instructions (Addendum)
 SURGICAL WAITING ROOM VISITATION Patients having surgery or a procedure may have no more than 2 support people in the waiting area - these visitors may rotate.    Children under the age of 37 must have an adult with them who is not the patient.  If the patient needs to stay at the hospital during part of their recovery, the visitor guidelines for inpatient rooms apply. Pre-op nurse will coordinate an appropriate time for 1 support person to accompany patient in pre-op.  This support person may not rotate.    Please refer to the [facility] website for the visitor guidelines for Inpatients (after your surgery is over and you are in a regular room).       Your procedure is scheduled on: 07-27-24   Report to [facility] Main Entrance    Report to admitting at 5:15 AM   Call this number if you have problems the morning of surgery [phone]   Do not eat food :After Midnight.   After Midnight you may have the following liquids until 4:30 AM DAY OF SURGERY  Water Non-Citrus Juices (without pulp, NO RED-Apple, White grape, White cranberry) Black Coffee (NO MILK/CREAM OR CREAMERS, sugar ok)  Clear Tea (NO MILK/CREAM OR CREAMERS, sugar ok) regular and decaf                             Plain Jell-O (NO RED)                                           Fruit ices (not with fruit pulp, NO RED)                                     Popsicles (NO RED)                                                               Sports drinks like Gatorade (NO RED)                      If you have questions, please contact your surgeon's office.   FOLLOW  ANY ADDITIONAL PRE OP INSTRUCTIONS YOU RECEIVED FROM YOUR SURGEON'S OFFICE!!!     Oral Hygiene is also important to reduce your risk of infection.                                    Remember - BRUSH YOUR TEETH THE MORNING OF SURGERY WITH YOUR REGULAR TOOTHPASTE   Do NOT smoke after Midnight   Take these medicines the morning of surgery with A SIP  OF WATER:    Zyrtec   Tylenol  if needed  Stop all vitamins and herbal supplements 7 days before surgery                              You may not have any metal on your body including hair pins, jewelry,  and body piercing             Do not wear make-up, lotions, powders, perfumes or deodorant  Do not wear nail polish including gel and S&S, artificial/acrylic nails, or any other type of covering on natural nails including finger and toenails. If you have artificial nails, gel coating, etc. that needs to be removed by a nail salon please have this removed prior to surgery or surgery may need to be canceled/ delayed if the surgeon/ anesthesia feels like they are unable to be safely monitored.   Do not shave  48 hours prior to surgery.      Do not bring valuables to the hospital. [facility] IS NOT RESPONSIBLE   FOR VALUABLES.   Contacts, dentures or bridgework may not be worn into surgery.  DO NOT BRING YOUR HOME MEDICATIONS TO THE HOSPITAL. PHARMACY WILL DISPENSE MEDICATIONS LISTED ON YOUR MEDICATION LIST TO YOU DURING YOUR ADMISSION IN THE HOSPITAL!    Patients discharged on the day of surgery will not be allowed to drive home.  Someone NEEDS to stay with you for the first 24 hours after anesthesia.              Please read over the following fact sheets you were given: IF YOU HAVE QUESTIONS ABOUT YOUR PRE-OP INSTRUCTIONS PLEASE CALL [phone] [facility]  If you received a COVID test during your pre-op visit  it is requested that you wear a mask when out in public, stay away from anyone that may not be feeling well and notify your surgeon if you develop symptoms. If you test positive for Covid or have been in contact with anyone that has tested positive in the last 10 days please notify you surgeon.  [facility] - Preparing for Surgery Before surgery, you can play an important role.  Because skin is not sterile, your skin needs to be as free of germs as possible.  You can reduce the number of  germs on your skin by washing with CHG (chlorahexidine gluconate) soap before surgery.  CHG is an antiseptic cleaner which kills germs and bonds with the skin to continue killing germs even after washing. Please DO NOT use if you have an allergy to CHG or antibacterial soaps.  If your skin becomes reddened/irritated stop using the CHG and inform your nurse when you arrive at Short Stay. Do not shave (including legs and underarms) for at least 48 hours prior to the first CHG shower.  You may shave your face/neck.  Please follow these instructions carefully:  1.  Shower with CHG Soap the night before surgery and the  morning of surgery.  2.  If you choose to wash your hair, wash your hair first as usual with your normal  shampoo.  3.  After you shampoo, rinse your hair and body thoroughly to remove the shampoo.                             4.  Use CHG as you would any other liquid soap.  You can apply chg directly to the skin and wash.  Gently with a scrungie or clean washcloth.  5.  Apply the CHG Soap to your body ONLY FROM THE NECK DOWN.   Do   not use on face/ open  Wound or open sores. Avoid contact with eyes, ears mouth and   genitals (private parts).                       Wash face,  Genitals (private parts) with your normal soap.             6.  Wash thoroughly, paying special attention to the area where your    surgery  will be performed.  7.  Thoroughly rinse your body with warm water from the neck down.  8.  DO NOT shower/wash with your normal soap after using and rinsing off the CHG Soap.                9.  Pat yourself dry with a clean towel.            10.  Wear clean pajamas.            11.  Place clean sheets on your bed the night of your first shower and do not  sleep with pets. Day of Surgery : Do not apply any lotions/deodorants the morning of surgery.  Please wear clean clothes to the hospital/surgery center.  FAILURE TO FOLLOW THESE INSTRUCTIONS MAY  RESULT IN THE CANCELLATION OF YOUR SURGERY  PATIENT SIGNATURE_________________________________  NURSE SIGNATURE__________________________________  ________________________________________________________________________

## 2024-07-22 ENCOUNTER — Encounter (HOSPITAL_COMMUNITY)
Admission: RE | Admit: 2024-07-22 | Discharge: 2024-07-22 | Disposition: A | Discharge status: Home / Self Care | Source: Ambulatory Visit | Attending: Gynecologic Oncology | Admitting: Gynecologic Oncology

## 2024-07-22 ENCOUNTER — Other Ambulatory Visit: Payer: Self-pay

## 2024-07-22 ENCOUNTER — Encounter (HOSPITAL_COMMUNITY): Payer: Self-pay

## 2024-07-22 VITALS — BP 140/75 | HR 90 | Temp 98.0°F | Resp 16 | Ht 61.0 in | Wt 165.0 lb

## 2024-07-22 DIAGNOSIS — Z01818 Encounter for other preprocedural examination: Secondary | ICD-10-CM | POA: Insufficient documentation

## 2024-07-22 LAB — CBC
HCT: 42.3 % (ref 36.0–46.0)
Hemoglobin: 13.8 g/dL (ref 12.0–15.0)
MCH: 29.7 pg (ref 26.0–34.0)
MCHC: 32.6 g/dL (ref 30.0–36.0)
MCV: 91.2 fL (ref 80.0–100.0)
Platelets: 244 K/uL (ref 150–400)
RBC: 4.64 MIL/uL (ref 3.87–5.11)
RDW: 13.2 % (ref 11.5–15.5)
WBC: 7.1 K/uL (ref 4.0–10.5)
nRBC: 0 % (ref 0.0–0.2)

## 2024-07-26 ENCOUNTER — Ambulatory Visit: Admitting: Obstetrics and Gynecology

## 2024-07-26 ENCOUNTER — Telehealth: Payer: Self-pay | Admitting: *Deleted

## 2024-07-26 NOTE — Telephone Encounter (Signed)
 Telephone call to check on pre-operative status. Spoke with patient's daughter Shelby Chan compliant with pre-operative instructions.  Reinforced nothing to eat after midnight. Clear liquids until 1030. Patient to arrive at 1130.  No questions or concerns voiced.  Instructed to call for any needs.

## 2024-07-27 ENCOUNTER — Ambulatory Visit (HOSPITAL_COMMUNITY): Admitting: Certified Registered Nurse Anesthetist

## 2024-07-27 ENCOUNTER — Other Ambulatory Visit: Payer: Self-pay

## 2024-07-27 ENCOUNTER — Encounter (HOSPITAL_COMMUNITY): Payer: Self-pay | Admitting: Gynecologic Oncology

## 2024-07-27 ENCOUNTER — Ambulatory Visit (HOSPITAL_COMMUNITY)
Admission: RE | Admit: 2024-07-27 | Discharge: 2024-07-27 | Disposition: A | Discharge status: Home / Self Care | Source: Ambulatory Visit | Attending: Gynecologic Oncology | Admitting: Gynecologic Oncology

## 2024-07-27 ENCOUNTER — Ambulatory Visit (HOSPITAL_COMMUNITY)
Admission: RE | Admit: 2024-07-27 | Discharge: 2024-07-27 | Disposition: A | Source: Ambulatory Visit | Attending: Gynecologic Oncology | Admitting: Gynecologic Oncology

## 2024-07-27 ENCOUNTER — Encounter (HOSPITAL_COMMUNITY)
Admission: RE | Disposition: A | Discharge status: Home / Self Care | Payer: Self-pay | Source: Ambulatory Visit | Attending: Gynecologic Oncology

## 2024-07-27 DIAGNOSIS — Z8616 Personal history of COVID-19: Secondary | ICD-10-CM | POA: Diagnosis not present

## 2024-07-27 DIAGNOSIS — R8290 Unspecified abnormal findings in urine: Secondary | ICD-10-CM

## 2024-07-27 DIAGNOSIS — Z87891 Personal history of nicotine dependence: Secondary | ICD-10-CM | POA: Diagnosis not present

## 2024-07-27 DIAGNOSIS — R1900 Intra-abdominal and pelvic swelling, mass and lump, unspecified site: Secondary | ICD-10-CM

## 2024-07-27 DIAGNOSIS — N852 Hypertrophy of uterus: Secondary | ICD-10-CM

## 2024-07-27 DIAGNOSIS — N8189 Other female genital prolapse: Secondary | ICD-10-CM | POA: Insufficient documentation

## 2024-07-27 HISTORY — PX: DILATATION & CURRETTAGE/HYSTEROSCOPY WITH RESECTOCOPE: SHX5572

## 2024-07-27 HISTORY — PX: OPERATIVE ULTRASOUND: SHX5996

## 2024-07-27 SURGERY — DILATATION & CURETTAGE/HYSTEROSCOPY WITH RESECTOCOPE
Anesthesia: General

## 2024-07-27 MED ORDER — FENTANYL CITRATE (PF) 100 MCG/2ML IJ SOLN
INTRAMUSCULAR | Status: DC | PRN
  Administered 2024-07-27 (×2): 25 ug via INTRAVENOUS
  Administered 2024-07-27: 50 ug via INTRAVENOUS

## 2024-07-27 MED ORDER — PROPOFOL 10 MG/ML IV BOLUS
INTRAVENOUS | Status: AC
Start: 2024-07-27 — End: 2024-07-27
  Filled 2024-07-27: qty 20

## 2024-07-27 MED ORDER — FENTANYL CITRATE (PF) 50 MCG/ML IJ SOSY: 25.0000 ug | PREFILLED_SYRINGE | INTRAMUSCULAR | Status: DC | PRN

## 2024-07-27 MED ORDER — SODIUM CHLORIDE 0.9 % IR SOLN
Status: DC | PRN
  Administered 2024-07-27: 3000 mL

## 2024-07-27 MED ORDER — LIDOCAINE HCL (PF) 2 % IJ SOLN
INTRAMUSCULAR | Status: DC | PRN
  Administered 2024-07-27: 60 mg via INTRADERMAL

## 2024-07-27 MED ORDER — CHLORHEXIDINE GLUCONATE 0.12 % MT SOLN
15.0000 mL | Freq: Once | OROMUCOSAL | Status: AC
  Administered 2024-07-27: 15 mL via OROMUCOSAL

## 2024-07-27 MED ORDER — ONDANSETRON HCL 4 MG/2ML IJ SOLN
INTRAMUSCULAR | Status: DC | PRN
  Administered 2024-07-27: 4 mg via INTRAVENOUS

## 2024-07-27 MED ORDER — LIDOCAINE 2% (20 MG/ML) 5 ML SYRINGE: INTRAMUSCULAR | Status: DC | PRN

## 2024-07-27 MED ORDER — LIDOCAINE HCL 1 % IJ SOLN
INTRAMUSCULAR | Status: AC
  Filled 2024-07-27: qty 20

## 2024-07-27 MED ORDER — PHENYLEPHRINE 80 MCG/ML (10ML) SYRINGE FOR IV PUSH (FOR BLOOD PRESSURE SUPPORT)
PREFILLED_SYRINGE | INTRAVENOUS | Status: AC
  Filled 2024-07-27: qty 10

## 2024-07-27 MED ORDER — ONDANSETRON HCL 4 MG/2ML IJ SOLN
INTRAMUSCULAR | Status: AC
  Filled 2024-07-27: qty 2

## 2024-07-27 MED ORDER — DEXAMETHASONE SOD PHOSPHATE PF 10 MG/ML IJ SOLN
4.0000 mg | INTRAMUSCULAR | Status: AC
  Administered 2024-07-27: 4 mg via INTRAVENOUS

## 2024-07-27 MED ORDER — OXYCODONE HCL 5 MG/5ML PO SOLN: 5.0000 mg | Freq: Once | ORAL | Status: DC | PRN

## 2024-07-27 MED ORDER — ACETAMINOPHEN 10 MG/ML IV SOLN: 1000.0000 mg | Freq: Once | INTRAVENOUS | Status: DC | PRN

## 2024-07-27 MED ORDER — DROPERIDOL 2.5 MG/ML IJ SOLN: 0.6250 mg | Freq: Once | INTRAMUSCULAR | Status: DC | PRN

## 2024-07-27 MED ORDER — PROPOFOL 10 MG/ML IV BOLUS
INTRAVENOUS | Status: DC | PRN
  Administered 2024-07-27: 30 mg via INTRAVENOUS
  Administered 2024-07-27: 40 mg via INTRAVENOUS
  Administered 2024-07-27: 70 mg via INTRAVENOUS

## 2024-07-27 MED ORDER — PHENYLEPHRINE 80 MCG/ML (10ML) SYRINGE FOR IV PUSH (FOR BLOOD PRESSURE SUPPORT)
PREFILLED_SYRINGE | INTRAVENOUS | Status: DC | PRN
  Administered 2024-07-27 (×2): 80 ug via INTRAVENOUS
  Administered 2024-07-27: 160 ug via INTRAVENOUS
  Administered 2024-07-27: 80 ug via INTRAVENOUS

## 2024-07-27 MED ORDER — FENTANYL CITRATE (PF) 100 MCG/2ML IJ SOLN
INTRAMUSCULAR | Status: AC
  Filled 2024-07-27: qty 2

## 2024-07-27 MED ORDER — EPHEDRINE 5 MG/ML INJ
INTRAVENOUS | Status: AC
Start: 2024-07-27 — End: 2024-07-27
  Filled 2024-07-27: qty 5

## 2024-07-27 MED ORDER — ACETAMINOPHEN 500 MG PO TABS
500.0000 mg | ORAL_TABLET | ORAL | Status: AC
  Administered 2024-07-27: 500 mg via ORAL
  Filled 2024-07-27: qty 1

## 2024-07-27 MED ORDER — OXYCODONE HCL 5 MG PO TABS: 5.0000 mg | ORAL_TABLET | Freq: Once | ORAL | Status: DC | PRN

## 2024-07-27 MED ORDER — EPHEDRINE SULFATE (PRESSORS) 25 MG/5ML IV SOSY
PREFILLED_SYRINGE | INTRAVENOUS | Status: DC | PRN
  Administered 2024-07-27: 5 mg via INTRAVENOUS

## 2024-07-27 MED ORDER — ORAL CARE MOUTH RINSE: 15.0000 mL | Freq: Once | OROMUCOSAL | Status: AC

## 2024-07-27 MED ORDER — LIDOCAINE HCL 1 % IJ SOLN
INTRAMUSCULAR | Status: DC | PRN
  Administered 2024-07-27: 20 mL

## 2024-07-27 MED ORDER — LACTATED RINGERS IV SOLN: INTRAVENOUS | Status: DC

## 2024-07-27 SURGICAL SUPPLY — 23 items
BAG COUNTER SPONGE SURGICOUNT (BAG) ×1 IMPLANT
BRIEF MESH DISP LRG (UNDERPADS AND DIAPERS) IMPLANT
CNTNR URN SCR LID CUP LEK RST (MISCELLANEOUS) IMPLANT
DEVICE MYOSURE LITE (MISCELLANEOUS) IMPLANT
DEVICE MYOSURE REACH (MISCELLANEOUS) IMPLANT
DILATOR CANAL MILEX (MISCELLANEOUS) IMPLANT
DRAPE SURG IRRIG POUCH 19X23 (DRAPES) ×1 IMPLANT
ELECT REM PT RETURN 15FT ADLT (MISCELLANEOUS) IMPLANT
GAUZE 4X4 16PLY ~~LOC~~+RFID DBL (SPONGE) IMPLANT
GLOVE BIO SURGEON STRL SZ 6 (GLOVE) ×2 IMPLANT
GOWN STRL REUS W/ TWL LRG LVL3 (GOWN DISPOSABLE) ×1 IMPLANT
IV NS IRRIG 3000ML ARTHROMATIC (IV SOLUTION) ×1 IMPLANT
KIT PROCED FLUENT PRO FLT212S (KITS) IMPLANT
KIT TURNOVER KIT A (KITS) ×1 IMPLANT
LOOP CUTTING BIPOLAR 21FR (ELECTRODE) IMPLANT
PACK VAGINAL WOMENS (CUSTOM PROCEDURE TRAY) ×1 IMPLANT
PAD OB MATERNITY 11 LF (PERSONAL CARE ITEMS) IMPLANT
SEAL ROD LENS SCOPE MYOSURE (ABLATOR) IMPLANT
SYR BULB IRRIG 60ML STRL (SYRINGE) ×1 IMPLANT
SYSTEM TISS REMOVAL MYOSURE XL (MISCELLANEOUS) IMPLANT
TOWEL OR DSP ST BLU DLX 10/PK (DISPOSABLE) ×1 IMPLANT
UNDERPAD 30X36 HEAVY ABSORB (UNDERPADS AND DIAPERS) ×1 IMPLANT
WATER STERILE IRR 500ML POUR (IV SOLUTION) ×1 IMPLANT

## 2024-07-27 NOTE — Discharge Instructions (Addendum)
 AFTER SURGERY INSTRUCTIONS   Return to work:  1-2 days if applicable   Activity: 1. Be up and out of the bed during the day.  Take a nap if needed.  You may walk up steps but be careful and use the hand rail.  Stair climbing will tire you more than you think, you may need to stop part way and rest.    2. No driving for minimum 24 hours after surgery.  Do not drive if you are taking narcotic pain medicine and make sure that your reaction time has returned.    3. You can shower as soon as the next day after surgery. Shower daily. No tub baths or submerging your body in water until cleared by your surgeon (2 weeks minimum). If you have the soap that was given to you by pre-surgical testing that was used before surgery, you do not need to use it afterwards because this can irritate your incisions.    4. No sexual activity and nothing in the vagina for 2 weeks.   5. You may experience vaginal spotting and discharge after surgery.  The spotting is normal but if you experience heavy bleeding, call our office.   6. Take Tylenol  and ibuprofen for pain if you are able to take these medications for discomfort as needed.     Diet: 1. Low sodium Heart Healthy Diet is recommended but you are cleared to resume your normal (before surgery) diet after your procedure.   2. It is safe to use a laxative, such as Miralax  or Colace, if you have difficulty moving your bowels.    Wound Care: 1. Keep clean and dry.  Shower daily.   Reasons to call the Doctor: Fever - Oral temperature greater than 100.4 degrees Fahrenheit Foul-smelling vaginal discharge Difficulty urinating Nausea and vomiting Increased pain at the site of the incision that is unrelieved with pain medicine. Difficulty breathing with or without chest pain New calf pain especially if only on one side Sudden, continuing increased vaginal bleeding with or without clots.   Contacts: For questions or concerns you should contact:   Dr.  Comer Dollar at [phone]   Eleanor Epps, NP at [phone]   After Hours: call [phone] and have the GYN Oncologist paged/contacted (after 5 pm or on the weekends).   Messages sent via mychart are for non-urgent matters and are not responded to after hours so for urgent needs, please call the after hours number.

## 2024-07-27 NOTE — Transfer of Care (Signed)
 Immediate Anesthesia Transfer of Care Note  Patient: Shelby Chan  Procedure(s) Performed: DILATION UNDER ULTRASOUND GUIDANCE, HYSTEROSCOPY US  INTRAOPERATIVE  Patient Location: PACU  Anesthesia Type:General  Level of Consciousness: awake, alert , oriented, and patient cooperative  Airway & Oxygen Therapy: Patient Spontanous Breathing and Patient connected to face mask oxygen  Post-op Assessment: Report given to RN and Post -op Vital signs reviewed and stable  Post vital signs: Reviewed and stable  Last Vitals:  Vitals Value Taken Time  BP 144/73 07/27/24 14:33  Temp    Pulse 73 07/27/24 14:35  Resp 13 07/27/24 14:35  SpO2 100 % 07/27/24 14:35  Vitals shown include unfiled device data.  Last Pain:  Vitals:   07/27/24 1119  TempSrc:   PainSc: 0-No pain         Complications: No notable events documented.

## 2024-07-27 NOTE — Anesthesia Preprocedure Evaluation (Addendum)
 Anesthesia Evaluation  Patient identified by MRN, date of birth, ID band Patient awake    Reviewed: Allergy & Precautions, H&P , NPO status , Patient's Chart, lab work & pertinent test results  History of Anesthesia Complications (+) PONV and history of anesthetic complications  Airway Mallampati: II  TM Distance: >3 FB Neck ROM: Full    Dental no notable dental hx.    Pulmonary former smoker   Pulmonary exam normal breath sounds clear to auscultation       Cardiovascular negative cardio ROS Normal cardiovascular exam Rhythm:Regular Rate:Normal     Neuro/Psych neg Seizures negative neurological ROS  negative psych ROS   GI/Hepatic Neg liver ROS,GERD  ,,  Endo/Other  negative endocrine ROS    Renal/GU negative Renal ROS  negative genitourinary   Musculoskeletal  (+) Arthritis ,    Abdominal   Peds negative pediatric ROS (+)  Hematology negative hematology ROS (+)   Anesthesia Other Findings pelvic mass  Reproductive/Obstetrics negative OB ROS                              Anesthesia Physical Anesthesia Plan  ASA: 2  Anesthesia Plan: General   Post-op Pain Management:    Induction: Intravenous  PONV Risk Score and Plan: 4 or greater and Ondansetron , Dexamethasone  and Treatment may vary due to age or medical condition  Airway Management Planned: LMA  Additional Equipment: None  Intra-op Plan:   Post-operative Plan: Extubation in OR  Informed Consent: I have reviewed the patients History and Physical, chart, labs and discussed the procedure including the risks, benefits and alternatives for the proposed anesthesia with the patient or authorized representative who has indicated his/her understanding and acceptance.     Dental advisory given  Plan Discussed with: CRNA  Anesthesia Plan Comments:          Anesthesia Quick Evaluation

## 2024-07-27 NOTE — Op Note (Addendum)
 OPERATIVE NOTE  PATIENT: Shelby Chan  ENCOUNTER DATE: 07/27/24  Preop Diagnosis: Pelvic mass, suspected hematometra  Postoperative Diagnosis: Suspected hematometra  Surgery: Dilation under ultrasound guidance, hysteroscopy using the Myosure  Surgeons:  Viktoria Crank, MD  Assistant: none  Anesthesia: General   Estimated blood loss: 5 ml  IVF:  see I&O flowsheet   Urine output: unmeasured   Fluid deficit: difficult to measure given fluid drained from the uterus  Complications: None apparent  Pathology: intra-uterine fluid for cytology  Operative findings: Mobile mass within the cul de sac, smooth. Stage III pelvic organ prolapse. Cervix normal in appearance. Ultrasound assistance used as dilating the cervix with drainage of brown tinged fluid once inside the cavity presumed to be dilated endometrium. Approximately 20 cc of this was collected and sent to cytology. Once the cavity had been somewhat drained, the hysteroscope was entered without difficulty. Some of the uterine side walls could be visualized with difficulty (appeared atrophic). Unfortunately, given laxity of the uterine walls, it was quite challenging to see and when I tried to increase the outflow, the walls collapsed some (making visualization difficult  also). Unable to see fundus well, unable to visualize tubal ostia.  Given difficulty with visualization, I deferred endometrial sampling during today's procedure.   Procedure: The patient was identified in the preoperative holding area. Informed consent was signed on the chart. Patient was seen history was reviewed and exam was performed.   The patient was then taken to the operating room and placed in the supine position with SCD hose on. General anesthesia was then induced without difficulty. She was then placed in the dorsolithotomy position. The perineum was prepped with Betadine . The vagina was prepped with Betadine . The patient was then draped after the  prep was dried.   Timeout was performed the patient, procedure, antibiotic, allergy, and length of procedure.   The speculum was placed in the vagina. The single tooth tenaculum was placed on the anterior lip of the cervix. 10 cc of 1% lidocaine  was injected for local anesthesia as a paracervical block. The uterine sound was placed in the cervix and advanced to the fundus. The cervix was successively dilated using pratts dilators to 85F under ultrasound guidance. Some of the fluid draining from the cavity was collected to be sent to cytology. Once significantly decompressed, the hysteroscope was inserted into the uterine cavity with findings as above. Given inability to see uterine walls (specifically uterine fundus) well and no visualization of tubal ostia, no biopsies were performed. Multiple attempts were made to drain the uterus (either using the hysteroscope or the dilators) without improved intra-uterine visualization or repeat hysteroscopy.   The remaining intra-uterine fluid was attempted to be drained by placing dilator into the uterus.  The tenaculum was removed and hemostasis was observed.   The vagina was irrigated.  All instrument, suture, laparotomy, Ray-Tec, and needle counts were correct x2. The patient tolerated the procedure well and was taken recovery room in stable condition.   Crank Viktoria MD Gynecologic Oncology

## 2024-07-27 NOTE — Anesthesia Procedure Notes (Signed)
 Procedure Name: LMA Insertion Date/Time: 07/27/2024 1:26 PM  Performed by: Franchot Delon RAMAN, CRNAPre-anesthesia Checklist: Patient identified, Emergency Drugs available, Suction available and Patient being monitored Patient Re-evaluated:Patient Re-evaluated prior to induction Oxygen Delivery Method: Circle System Utilized Preoxygenation: Pre-oxygenation with 100% oxygen Induction Type: IV induction Ventilation: Mask ventilation without difficulty LMA: LMA inserted LMA Size: 4.0 Number of attempts: 1 Placement Confirmation: positive ETCO2 Tube secured with: Tape Dental Injury: Teeth and Oropharynx as per pre-operative assessment

## 2024-07-27 NOTE — Interval H&P Note (Signed)
 History and Physical Interval Note:  07/27/2024 11:50 AM  Shelby Chan  has presented today for surgery, with the diagnosis of pelvic mass.  The various methods of treatment have been discussed with the patient and family. After consideration of risks, benefits and other options for treatment, the patient has consented to  Procedure(s): DILATATION & CURETTAGE/HYSTEROSCOPY WITH RESECTOCOPE (N/A) US  INTRAOPERATIVE (N/A) as a surgical intervention.  The patient's history has been reviewed, patient examined, no change in status, stable for surgery.  I have reviewed the patient's chart and labs.  Questions were answered to the patient's satisfaction.     Comer JONELLE Dollar

## 2024-07-27 NOTE — Anesthesia Postprocedure Evaluation (Signed)
 Anesthesia Post Note  Patient: Shelby Chan  Procedure(s) Performed: DILATION UNDER ULTRASOUND GUIDANCE, HYSTEROSCOPY US  INTRAOPERATIVE     Patient location during evaluation: PACU Anesthesia Type: General Level of consciousness: awake and alert Pain management: pain level controlled Vital Signs Assessment: post-procedure vital signs reviewed and stable Respiratory status: spontaneous breathing, nonlabored ventilation, respiratory function stable and patient connected to nasal cannula oxygen Cardiovascular status: blood pressure returned to baseline and stable Postop Assessment: no apparent nausea or vomiting Anesthetic complications: no   No notable events documented.  Last Vitals:  Vitals:   07/27/24 1502 07/27/24 1515  BP: 129/68 135/72  Pulse: 66 77  Resp: 13 18  Temp:    SpO2: 100% 99%    Last Pain:  Vitals:   07/27/24 1514  TempSrc:   PainSc: 0-No pain                 Thom JONELLE Peoples

## 2024-07-28 ENCOUNTER — Encounter (HOSPITAL_COMMUNITY): Payer: Self-pay | Admitting: Gynecologic Oncology

## 2024-07-29 ENCOUNTER — Telehealth: Payer: Self-pay

## 2024-07-29 NOTE — Telephone Encounter (Signed)
 That's great - glad they found it!

## 2024-07-29 NOTE — Telephone Encounter (Signed)
 Spoke with Ms.Suzanne Jaycee, Pt's daughter this morning. She states her mom is eating, drinking and urinating well. She has had a BM and is passing gas. She is taking senokot as prescribed and encouraged her to drink plenty of water. She denies fever or chills.No incisions.  She rates her pain  I am feeling fine and will take an occasional Tylenol .   Instructed to call office with any fever, chills, purulent drainage, uncontrolled pain or any other questions or concerns. Patient verbalizes understanding.   Pt aware of post op appointments as well as the office number [phone] and after hours number [phone] to call if she has any questions or concerns.   Ms.Massey wanted Dr.Tucker to know they found the pessary. The cleaning lady found it and threw it in the trash. Elvie will call Dr.Schroeder on Monday.

## 2024-08-01 ENCOUNTER — Telehealth: Payer: Self-pay | Admitting: Obstetrics and Gynecology

## 2024-08-01 NOTE — Telephone Encounter (Signed)
 Pt's daughter called today.  Stated pessary had fallen in toilet with out patient being aware.  Had surgery and they figured out it wasn't there.  Will need another pessary when she comes in for Urodynamics and E-String next week.

## 2024-08-02 LAB — CYTOLOGY - NON PAP

## 2024-08-03 ENCOUNTER — Inpatient Hospital Stay: Attending: Gynecologic Oncology | Admitting: Gynecologic Oncology

## 2024-08-03 ENCOUNTER — Encounter: Payer: Self-pay | Admitting: Gynecologic Oncology

## 2024-08-03 DIAGNOSIS — N857 Hematometra: Secondary | ICD-10-CM

## 2024-08-03 DIAGNOSIS — Z7189 Other specified counseling: Secondary | ICD-10-CM

## 2024-08-03 DIAGNOSIS — N852 Hypertrophy of uterus: Secondary | ICD-10-CM

## 2024-08-03 NOTE — Progress Notes (Signed)
 Gynecologic Oncology Telehealth Note: Gyn-Onc  I connected with Shelby Chan on 08/03/24 at  6:00 PM EST by telephone and verified that I am speaking with the correct person using two identifiers.  I discussed the limitations, risks, security and privacy concerns of performing an evaluation and management service by telemedicine and the availability of in-person appointments. I also discussed with the patient that there may be a patient responsible charge related to this service. The patient expressed understanding and agreed to proceed.  Other persons participating in the visit and their role in the encounter: patient's daughter.  Patient's location: home, [place] Provider's location: [facility], [place]  Reason for Visit: follow-up  Treatment History: The patient was admitted in May after presenting with altered mental status and a fall.  She was admitted for a urinary tract infection and started on IV antibiotics.  Was also found to be COVID-positive at that time and was treated with Paxlovid .  She was admited again in June with pyelonephritis. During that admission, she under CT renal stone study that showed moderate left perinephric and peripelvic inflammatory stranding. Uterus noted to be lobulated and enlarged. She saw Dr. Marilynne in December for her history of UTIs and urinary incontinence. Given enlarged uterus on exam, pelvic MRI was ordered.    MRI pelvic 06/29/24: 1. There is a large cystic lesion in the expected location of the uterus measuring 6.0 x 7.4 cm with markedly thinned myometrium. 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 There is a small dependent lobulated 1.1 x 1.6 cm area along the dependent portion however, which does not show enhancement on the postcontrast images and favored to represent proteinaceous/hemorrhagic contents. 2. There is an exophytic, enhancing, subserosal/intramural 4.0 x 5.4 cm lesion on the right side of the uterus,  which is favored to represent a leiomyoma. There are additional smaller leiomyomas. 3. There is incompletely characterized approximately 2.3 x 4.3 cm, T1 hyperintense, nonenhancing area centered within the subtrochanteric proximal femur, medullary cavity. This corresponds to a lytic area on the prior CT scan. This may represent an intra osseous lipoma however, further evaluation with contrast-enhanced MRI is recommended to exclude underlying neoplastic process. 4. There is a T2 hyperintense ill-defined lesion in the left ala of sacrum, which is also incompletely characterized on the current exam and may also represent lipoma.  07/27/24: Dilation under ultrasound guidance, hysteroscopy using the Myosure   Interval History: Denies vaginal bleeding or discharge. Back pain resolved the day after.  Found pessary.   Past Medical/Surgical History: Past Medical History:  Diagnosis Date   Arthritis    Complication of anesthesia    High cholesterol    PONV (postoperative nausea and vomiting)     Past Surgical History:  Procedure Laterality Date   CATARACT EXTRACTION W/ INTRAOCULAR LENS IMPLANT Bilateral    CHOLECYSTECTOMY     DILATATION & CURRETTAGE/HYSTEROSCOPY WITH RESECTOCOPE N/A 07/27/2024   Procedure: DILATION UNDER ULTRASOUND GUIDANCE, HYSTEROSCOPY;  Surgeon: Viktoria Comer SAUNDERS, MD;  Location: [facility];  Service: Gynecology;  Laterality: N/A;  DILATION UNDER ULTRASOUND GUIDANCE, HYSTEROSCOPY USING MYOSURE   GLUTEUS MINIMUS REPAIR Right 09/24/2023   Procedure: RIGHT GLUTEUS MEDIUS REPAIR WITH COLLAGEN PATCH AUGMENTATION;  Surgeon: Genelle Standing, MD;  Location: [facility];  Service: Orthopedics;  Laterality: Right;   HIP PINNING,CANNULATED Right 11/07/2021   Procedure: RIGHT HIP PERCUTANEOUS SCREW FIXATION;  Surgeon: Genelle Standing, MD;  Location: [facility] OR;  Service: Orthopedics;  Laterality: Right;   OPERATIVE ULTRASOUND N/A 07/27/2024  Procedure: US  INTRAOPERATIVE;  Surgeon: Viktoria Comer SAUNDERS, MD;  Location: [facility];  Service: Gynecology;  Laterality: N/A;   Right leg fracture s/p repair with 2 pins placed     TUBAL LIGATION      Family History  Problem Relation Age of Onset   Heart disease Mother    Lymphoma Sister    Leukemia Son    Bladder Cancer Neg Hx    Renal cancer Neg Hx    Uterine cancer Neg Hx    Breast cancer Neg Hx    Ovarian cancer Neg Hx    Colon cancer Neg Hx    Pancreatic cancer Neg Hx     Social History   Socioeconomic History   Marital status: Widowed    Spouse name: Not on file   Number of children: Not on file   Years of education: Not on file   Highest education level: Not on file  Occupational History   Not on file  Tobacco Use   Smoking status: Former    Types: Cigarettes   Smokeless tobacco: Never  Vaping Use   Vaping status: Never Used  Substance and Sexual Activity   Alcohol use: Yes    Comment: Rare   Drug use: Never   Sexual activity: Not Currently    Partners: Male  Other Topics Concern   Not on file  Social History Narrative   Not on file   Social Drivers of Health   Financial Resource Strain: Not on file  Food Insecurity: No Food Insecurity (07/14/2024)   Hunger Vital Sign    Worried About Running Out of Food in the Last Year: Never true    Ran Out of Food in the Last Year: Never true  Transportation Needs: No Transportation Needs (07/14/2024)   PRAPARE - Administrator, Civil Service (Medical): No    Lack of Transportation (Non-Medical): No  Physical Activity: Not on file  Stress: Not on file  Social Connections: Moderately Integrated (02/01/2024)   Social Connection and Isolation Panel    Frequency of Communication with Friends and Family: More than three times a week    Frequency of Social Gatherings with Friends and Family: More than three times a week    Attends Religious Services: More than 4 times per year    Active Member of [employer] or Organizations: No    Attends Banker Meetings:  1 to 4 times per year    Marital Status: Widowed    Current Medications:  Current Outpatient Medications:    acetaminophen  (TYLENOL ) 650 MG CR tablet, Take 1,300 mg by mouth every 8 (eight) hours as needed for pain., Disp: , Rfl:    aspirin  EC 81 MG tablet, Take 1 tablet (81 mg total) by mouth 2 (two) times daily. Continue taking once a day after a month (Patient taking differently: Take 81 mg by mouth daily.), Disp: 60 tablet, Rfl: 0   Calcium Carbonate-Vitamin D  (CALTRATE 600+D PO), Take 1 tablet by mouth daily., Disp: , Rfl:    cetirizine (ZYRTEC) 10 MG tablet, Take 10 mg by mouth daily., Disp: , Rfl:    Coenzyme Q10 100 MG capsule, Take 100 mg by mouth daily., Disp: , Rfl:    estradiol  (ESTRACE ) 0.01 % CREA vaginal cream, Place 0.5g at night in the vagina twice a week (Patient not taking: No sig reported), Disp: 42.5 g, Rfl: 11   estradiol  (ESTRING ) 7.5 MCG/24HR vaginal ring, Place 1 each vaginally every 3 (three)  months., Disp: 1 each, Rfl: 3   fosfomycin (MONUROL ) 3 g PACK, Take 3 g by mouth once. Takes every 10 days, Disp: , Rfl:    Multiple Vitamins-Minerals (CENTRUM SILVER WOMEN 50+) TABS, Take 1 tablet by mouth daily with breakfast., Disp: , Rfl:   Review of Symptoms: Pertinent positives as per HPI.  Physical Exam: Deferred given limitations of phone visit.  Laboratory & Radiologic Studies: FINAL MICROSCOPIC DIAGNOSIS:  - No malignant cells identified  - Macrophages and scant benign columnar epithelium within a  proteinaceous background   Assessment & Plan: Shelby Chan is a 89 y.o. woman with pelvic organ prolapse who was found to have intrauterine, now status post dilation under ultrasound guidance, manage of intrauterine fluid, hysteroscopy. Cytology from fluid showed no malignant cells.  Patient is overall doing very well.  According to her daughter, back pain that she had been experiencing for months resolved by the day after surgery.  She denies any significant  bleeding or drainage since surgery.  Discussed cytology.  Reviewed again reason that I was not able to safely biopsy what I presume to be the walls of the endometrium.  I would like to plan on a repeat pelvic ultrasound about 4 weeks after surgery.  If no significant intrauterine fluid and the endometrium itself looks normal, I think we can continue with close surveillance.  If there is something concerning on imaging, we may have to discuss attempt again at endometrial biopsy.  I will touch base with Dr. Marilynne.  If the patient were to undergo any sort of surgical treatment for her prolapse, it would be worth at least discussion about concurrent hysterectomy.  I discussed the assessment and treatment plan with the patient. The patient was provided with an opportunity to ask questions and all were answered. The patient agreed with the plan and demonstrated an understanding of the instructions.   The patient was advised to call back or see an in-person evaluation if the symptoms worsen or if the condition fails to improve as anticipated.   10 minutes of total time was spent for this patient encounter, including preparation, phone counseling with the patient and coordination of care, and documentation of the encounter.   Comer Dollar, MD  Division of Gynecologic Oncology  Department of Obstetrics and Gynecology  [facility]

## 2024-08-04 ENCOUNTER — Telehealth: Payer: Self-pay

## 2024-08-04 DIAGNOSIS — R944 Abnormal results of kidney function studies: Secondary | ICD-10-CM | POA: Diagnosis not present

## 2024-08-04 NOTE — Telephone Encounter (Signed)
-----   Message from Comer JONELLE Dollar sent at 08/03/2024  4:46 PM EST ----- HI all, Could you please schedule this patient to have a pelvic ultrasound at [facility] at the end of December and a phone visit with me shortly after her visit with Dr. Marilynne on 1/7? Please call the daughter to let her know about these appointments.  Thank so much! kat

## 2024-08-04 NOTE — Telephone Encounter (Signed)
 Per Dr. Viktoria,  pt is scheduled for an ultrasound on 12/29 and a phone visit on 1/8. LVM for pt's daughter, Elvie, to call the office regarding these appointments.

## 2024-08-04 NOTE — Telephone Encounter (Signed)
 Ms.Rylands daughter is aware of upcoming ultrasound and phone visit appointments

## 2024-08-08 ENCOUNTER — Ambulatory Visit: Admitting: Obstetrics and Gynecology

## 2024-08-10 ENCOUNTER — Ambulatory Visit: Admitting: Obstetrics and Gynecology

## 2024-08-10 ENCOUNTER — Encounter: Payer: Self-pay | Admitting: Obstetrics and Gynecology

## 2024-08-10 VITALS — BP 129/83 | HR 90

## 2024-08-10 DIAGNOSIS — N3281 Overactive bladder: Secondary | ICD-10-CM | POA: Diagnosis not present

## 2024-08-10 DIAGNOSIS — N393 Stress incontinence (female) (male): Secondary | ICD-10-CM | POA: Diagnosis not present

## 2024-08-10 DIAGNOSIS — R350 Frequency of micturition: Secondary | ICD-10-CM

## 2024-08-10 DIAGNOSIS — R948 Abnormal results of function studies of other organs and systems: Secondary | ICD-10-CM | POA: Diagnosis not present

## 2024-08-10 DIAGNOSIS — Z8781 Personal history of (healed) traumatic fracture: Secondary | ICD-10-CM | POA: Diagnosis not present

## 2024-08-10 DIAGNOSIS — Z0000 Encounter for general adult medical examination without abnormal findings: Secondary | ICD-10-CM | POA: Diagnosis not present

## 2024-08-10 DIAGNOSIS — R944 Abnormal results of kidney function studies: Secondary | ICD-10-CM | POA: Diagnosis not present

## 2024-08-10 DIAGNOSIS — J302 Other seasonal allergic rhinitis: Secondary | ICD-10-CM | POA: Diagnosis not present

## 2024-08-10 DIAGNOSIS — N857 Hematometra: Secondary | ICD-10-CM | POA: Diagnosis not present

## 2024-08-10 DIAGNOSIS — R339 Retention of urine, unspecified: Secondary | ICD-10-CM

## 2024-08-10 DIAGNOSIS — E782 Mixed hyperlipidemia: Secondary | ICD-10-CM | POA: Diagnosis not present

## 2024-08-10 DIAGNOSIS — N39490 Overflow incontinence: Secondary | ICD-10-CM

## 2024-08-10 DIAGNOSIS — Z23 Encounter for immunization: Secondary | ICD-10-CM | POA: Diagnosis not present

## 2024-08-10 DIAGNOSIS — N812 Incomplete uterovaginal prolapse: Secondary | ICD-10-CM

## 2024-08-10 DIAGNOSIS — M25551 Pain in right hip: Secondary | ICD-10-CM | POA: Diagnosis not present

## 2024-08-10 DIAGNOSIS — Z0001 Encounter for general adult medical examination with abnormal findings: Secondary | ICD-10-CM | POA: Diagnosis not present

## 2024-08-10 DIAGNOSIS — E559 Vitamin D deficiency, unspecified: Secondary | ICD-10-CM | POA: Diagnosis not present

## 2024-08-10 DIAGNOSIS — Z862 Personal history of diseases of the blood and blood-forming organs and certain disorders involving the immune mechanism: Secondary | ICD-10-CM | POA: Diagnosis not present

## 2024-08-10 LAB — POCT URINE DIPSTICK
Blood, UA: NEGATIVE
Glucose, UA: NEGATIVE mg/dL
Ketones, POC UA: NEGATIVE mg/dL
LEUKOCYTES: NEGATIVE
NEG CONTROL: NEGATIVE
Nitrite, UA: NEGATIVE
POC PROTEIN,UA: NEGATIVE
Spec Grav, UA: 1.02 (ref 1.010–1.025)
Urobilinogen, UA: 0.2 U/dL
pH, UA: 7 (ref 5.0–8.0)

## 2024-08-10 NOTE — Progress Notes (Signed)
 [facility] Urodynamics Procedure  Referring Physician: Shona Norleen PEDLAR, MD Date of Procedure: 08/10/2024  Shelby Chan is a 89 y.o. female who presents for urodynamic evaluation. Indication(s) for study: occult SUI and neurogenic bladder  Vital Signs: BP 129/83   Pulse 90   Laboratory Results: A catheterized urine specimen revealed:  POC urine:  Lab Results  Component Value Date   COLORU yellow 08/10/2024   CLARITYU clear 08/10/2024   GLUCOSEUR negative 08/10/2024   BILIRUBINUR negative 06/22/2024   KETONESU Negative 03/17/2024   SPECGRAV 1.020 08/10/2024   RBCUR negative 08/10/2024   PHUR 7.0 08/10/2024   PROTEINUR 2+ (A) 03/17/2024   UROBILINOGEN 0.2 08/10/2024   LEUKOCYTESUR Small (1+) (A) 06/22/2024     Voiding Diary: Deferred  Procedure Timeout:  The correct patient was verified and the correct procedure was verified. The patient was in the correct position and safety precautions were reviewed based on at the patient's history.  Urodynamic Procedure A 35F dual lumen urodynamics catheter was placed under sterile conditions into the patient's bladder. A 35F catheter was placed into the rectum in order to measure abdominal pressure. EMG patches were placed in the appropriate position.  All connections were confirmed and calibrations/adjusted made. Saline was instilled into the bladder through the dual lumen catheters.  Cough/valsalva pressures were measured periodically during filling.  Patient was allowed to void.  The bladder was then emptied of its residual.  UROFLOW: Revealed a Qmax of 6 mL/sec.  She voided 25 mL and had a residual of 220 mL.  It was a normal pattern and represented normal habits though interpretation limited due to low voided volume.  CMG: This was performed with sterile water in the sitting position at a fill rate of 30-40 mL/min.    First sensation of fullness was 71 mLs,  First urge was 129 mLs,  Strong urge was 344 mLs and   Capacity was 430 mLs  Stress incontinence was demonstrated Highest positive Barrier CLPP was 61 cmH20 at 305 ml. Highest positive Barrier VLPP was 27 cmH20 at 305 ml.  Detrusor function was overactive, with phasic contractions seen.  The first occurred at 15 mL to 3 cm of water and was not associated with urge.  Compliance:  Low. End fill detrusor pressure was 16cmH20.  Calculated compliance was 64mL/cmH20  UPP: MUCP with barrier reduction was 59 cm of water.    MICTURITION STUDY: Voiding was performed with reduction using scopettes in the sitting position.  Pdet at Qmax was 25 cm of water.  Qmax was 12 mL/sec.  It was a interrupted pattern.  She voided 81 mL and had a residual of 349 mL.  It was a volitional void, sustained detrusor contraction was present and abdominal straining was not present  EMG: This was performed with patches.  She had voluntary contractions, recruitment with fill was present and urethral sphincter was not relaxed with void.  The details of the procedure with the study tracings have been scanned into EPIC.   Urodynamic Impression:  1. Sensation was normal; capacity was normal 2. Stress Incontinence was demonstrated at ISD range pressures; 3. Detrusor Overactivity was demonstrated without leakage. 4. Emptying was dysfunctional with a elevated PVR, a sustained detrusor contraction present,  abdominal straining not present, dyssynergic urethral sphincter activity on EMG.  Plan: - The patient will follow up  to discuss the findings and treatment options.  -Patient was fit with a #5 Incontinence ring with support pessary. E-string was placed. Patient was  able to void, valsalva, cough, and attempted defecation without pessary expulsion.

## 2024-08-22 ENCOUNTER — Other Ambulatory Visit: Payer: Self-pay | Admitting: Gynecologic Oncology

## 2024-08-22 DIAGNOSIS — N852 Hypertrophy of uterus: Secondary | ICD-10-CM

## 2024-08-22 DIAGNOSIS — N857 Hematometra: Secondary | ICD-10-CM

## 2024-08-29 ENCOUNTER — Ambulatory Visit (HOSPITAL_COMMUNITY)
Admission: RE | Admit: 2024-08-29 | Discharge: 2024-08-29 | Disposition: A | Discharge status: Home / Self Care | Source: Ambulatory Visit | Attending: Gynecologic Oncology | Admitting: Gynecologic Oncology

## 2024-08-29 DIAGNOSIS — N857 Hematometra: Secondary | ICD-10-CM | POA: Insufficient documentation

## 2024-08-29 DIAGNOSIS — N852 Hypertrophy of uterus: Secondary | ICD-10-CM | POA: Diagnosis present

## 2024-09-07 ENCOUNTER — Encounter: Payer: Self-pay | Admitting: Obstetrics and Gynecology

## 2024-09-07 ENCOUNTER — Ambulatory Visit: Admitting: Obstetrics and Gynecology

## 2024-09-07 VITALS — BP 128/81 | HR 91

## 2024-09-07 DIAGNOSIS — N393 Stress incontinence (female) (male): Secondary | ICD-10-CM | POA: Diagnosis not present

## 2024-09-07 DIAGNOSIS — N812 Incomplete uterovaginal prolapse: Secondary | ICD-10-CM | POA: Diagnosis not present

## 2024-09-07 DIAGNOSIS — N390 Urinary tract infection, site not specified: Secondary | ICD-10-CM

## 2024-09-07 DIAGNOSIS — N3281 Overactive bladder: Secondary | ICD-10-CM | POA: Diagnosis not present

## 2024-09-07 LAB — POCT URINALYSIS DIP (CLINITEK)
Bilirubin, UA: NEGATIVE
Glucose, UA: NEGATIVE mg/dL
Ketones, POC UA: NEGATIVE mg/dL
Nitrite, UA: POSITIVE — AB
Spec Grav, UA: 1.015
Urobilinogen, UA: 0.2 U/dL
pH, UA: 6

## 2024-09-07 MED ORDER — SULFAMETHOXAZOLE-TRIMETHOPRIM 800-160 MG PO TABS: 1.0000 | ORAL_TABLET | Freq: Two times a day (BID) | ORAL | 0 refills | Status: AC

## 2024-09-07 MED ORDER — TROSPIUM CHLORIDE ER 60 MG PO CP24: 1.0000 | ORAL_CAPSULE | Freq: Every day | ORAL | 5 refills | Status: AC

## 2024-09-07 NOTE — Progress Notes (Signed)
 [facility] Return Visit  SUBJECTIVE  History of Present Illness: Shelby Chan is a 89 y.o. female seen in follow-up for prolapse and incontinence. She currently has a #5 incontinence ring with support and estring  (placed 08/10/24). She had a pelvic MRI with showed hemtatometra and had D&C with Dr Viktoria on 07/27/24. Has f/I with Dr Viktoria tomorrow.   Still having a lot of leakage throughout the day. Usually large leakage with standing after sitting for some time. But it has improved. She is urinating about every 2 hours. Sometimes will have some urinary hesitancy.   Urodynamic Impression:  1. Sensation was normal; capacity was normal 2. Stress Incontinence was demonstrated at ISD range pressures; 3. Detrusor Overactivity was demonstrated without leakage. 4. Emptying was dysfunctional with a elevated PVR (349 mL), a sustained detrusor contraction present,  abdominal straining not present, dyssynergic urethral sphincter activity on EMG.  Past Medical History: Patient  has a past medical history of Arthritis, Complication of anesthesia, High cholesterol, and PONV (postoperative nausea and vomiting).   Past Surgical History: She  has a past surgical history that includes Cholecystectomy; Right leg fracture s/p repair with 2 pins placed; Hip pinning, cannulated (Right, 11/07/2021); Gluteus minimus repair (Right, 09/24/2023); Tubal ligation; Cataract extraction w/ intraocular lens implant (Bilateral); Dilatation & currettage/hysteroscopy with resectoscope (N/A, 07/27/2024); and Operative ultrasound (N/A, 07/27/2024).   Medications: She has a current medication list which includes the following prescription(s): acetaminophen , aspirin  ec, calcium carbonate-vitamin d , cetirizine, coenzyme q10, estradiol , fosfomycin , centrum silver women 50+, sulfamethoxazole -trimethoprim , and trospium  chloride.   Allergies: Patient has no known allergies.   Social History: Patient  reports that  she has quit smoking. Her smoking use included cigarettes. She has never used smokeless tobacco. She reports current alcohol use. She reports that she does not use drugs.     OBJECTIVE     Physical Exam: Vitals:   09/07/24 0954 09/07/24 0955  BP: (!) 146/89 128/81  Pulse: 91 91   Gen: No apparent distress, A&O x 3.  Urethra was prepped with betadine  and straight catheter placed. PVR was .   Results for orders placed or performed in visit on 09/07/24  POCT URINALYSIS DIP (CLINITEK)   Collection Time: 09/07/24 10:21 AM  Result Value Ref Range   Color, UA yellow yellow   Clarity, UA clear clear   Glucose, UA negative negative mg/dL   Bilirubin, UA negative negative   Ketones, POC UA negative negative mg/dL   Spec Grav, UA 8.984 8.989 - 1.025   Blood, UA moderate (A) negative   pH, UA 6.0 5.0 - 8.0   POC PROTEIN,UA trace negative, trace   Urobilinogen, UA 0.2 0.2 or 1.0 E.U./dL   Nitrite, UA Positive (A) Negative   Leukocytes, UA Large (3+) (A) Negative       ASSESSMENT AND PLAN    Ms. Hatcher is a 89 y.o. with:  1. Acute UTI   2. Overactive bladder   3. SUI (stress urinary incontinence, female)   4. Uterovaginal prolapse, incomplete     Acute UTI Assessment & Plan: - Will treat with Bactrim . Urine sent for pathnostics molecular testing for culture and to assess for fosfomycin  resistance.  - Currently has been on fosfomycin  prophylaxis every 10 days, last dose 1/5.   Orders: -     Sulfamethoxazole -Trimethoprim ; Take 1 tablet by mouth 2 (two) times daily for 3 days.  Dispense: 6 tablet; Refill: 0 -     Office Manager; Future -  POCT URINALYSIS DIP (CLINITEK)  Overactive bladder Assessment & Plan: - PVR has improved with pessary placement so will start Trospium  60mg  ER daily to help with OAB symptoms.   Orders: -     Trospium  Chloride ER; Take 1 capsule (60 mg total) by mouth daily.  Dispense: 30 capsule; Refill: 5 -     POCT URINALYSIS DIP  (CLINITEK)  SUI (stress urinary incontinence, female) Assessment & Plan: - Has incontinence pessary   Uterovaginal prolapse, incomplete Assessment & Plan: - Continue with pessary. Will clean next in 2 months and place estring  at the same time.    Return 2 months   Rosaline LOISE Caper, MD

## 2024-09-07 NOTE — Assessment & Plan Note (Signed)
-   PVR has improved with pessary placement so will start Trospium  60mg  ER daily to help with OAB symptoms.

## 2024-09-07 NOTE — Assessment & Plan Note (Signed)
-   Continue with pessary. Will clean next in 2 months and place estring  at the same time.

## 2024-09-07 NOTE — Assessment & Plan Note (Signed)
-   Will treat with Bactrim . Urine sent for pathnostics molecular testing for culture and to assess for fosfomycin  resistance.  - Currently has been on fosfomycin  prophylaxis every 10 days, last dose 1/5.

## 2024-09-07 NOTE — Assessment & Plan Note (Signed)
-   Has incontinence pessary

## 2024-09-08 ENCOUNTER — Inpatient Hospital Stay: Attending: Gynecologic Oncology | Admitting: Gynecologic Oncology

## 2024-09-08 ENCOUNTER — Encounter: Payer: Self-pay | Admitting: Gynecologic Oncology

## 2024-09-08 DIAGNOSIS — N852 Hypertrophy of uterus: Secondary | ICD-10-CM | POA: Diagnosis not present

## 2024-09-08 NOTE — Progress Notes (Signed)
 Gynecologic Oncology Telehealth Note: Gyn-Onc  I connected with Shelby Chan on 09/08/2024 at  6:00 PM EST by telephone and verified that I am speaking with the correct person using two identifiers.  I discussed the limitations, risks, security and privacy concerns of performing an evaluation and management service by telemedicine and the availability of in-person appointments. I also discussed with the patient that there may be a patient responsible charge related to this service. The patient expressed understanding and agreed to proceed.  Other persons participating in the visit and their role in the encounter: patient's daughter.  Patient's location: home, [place] Provider's location: [facility], [place]  Reason for Visit: follow-up  Treatment History: The patient was admitted in May after presenting with altered mental status and a fall.  She was admitted for a urinary tract infection and started on IV antibiotics.  Was also found to be COVID-positive at that time and was treated with Paxlovid .  She was admited again in June with pyelonephritis. During that admission, she under CT renal stone study that showed moderate left perinephric and peripelvic inflammatory stranding. Uterus noted to be lobulated and enlarged. She saw Dr. Marilynne in December for her history of UTIs and urinary incontinence. Given enlarged uterus on exam, pelvic MRI was ordered.    MRI pelvic 06/29/24: 1. There is a large cystic lesion in the expected location of the uterus measuring 6.0 x 7.4 cm with markedly thinned myometrium. 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 There is a small dependent lobulated 1.1 x 1.6 cm area along the dependent portion however, which does not show enhancement on the postcontrast images and favored to represent proteinaceous/hemorrhagic contents. 2. There is an exophytic, enhancing, subserosal/intramural 4.0 x 5.4 cm lesion on the right side of the uterus,  which is favored to represent a leiomyoma. There are additional smaller leiomyomas. 3. There is incompletely characterized approximately 2.3 x 4.3 cm, T1 hyperintense, nonenhancing area centered within the subtrochanteric proximal femur, medullary cavity. This corresponds to a lytic area on the prior CT scan. This may represent an intra osseous lipoma however, further evaluation with contrast-enhanced MRI is recommended to exclude underlying neoplastic process. 4. There is a T2 hyperintense ill-defined lesion in the left ala of sacrum, which is also incompletely characterized on the current exam and may also represent lipoma.   07/27/24: Dilation under ultrasound guidance, hysteroscopy using the Myosure   Interval History: Patient has another UTI. Saw Dr. Janina yesterday. Still retaining some urine (less), having some urinary accidents (less than before). Having less back pain since surgery.  Past Medical/Surgical History: Past Medical History:  Diagnosis Date   Arthritis    Complication of anesthesia    High cholesterol    PONV (postoperative nausea and vomiting)     Past Surgical History:  Procedure Laterality Date   CATARACT EXTRACTION W/ INTRAOCULAR LENS IMPLANT Bilateral    CHOLECYSTECTOMY     DILATATION & CURRETTAGE/HYSTEROSCOPY WITH RESECTOCOPE N/A 07/27/2024   Procedure: DILATION UNDER ULTRASOUND GUIDANCE, HYSTEROSCOPY;  Surgeon: Viktoria Comer SAUNDERS, MD;  Location: [facility];  Service: Gynecology;  Laterality: N/A;  DILATION UNDER ULTRASOUND GUIDANCE, HYSTEROSCOPY USING MYOSURE   GLUTEUS MINIMUS REPAIR Right 09/24/2023   Procedure: RIGHT GLUTEUS MEDIUS REPAIR WITH COLLAGEN PATCH AUGMENTATION;  Surgeon: Genelle Standing, MD;  Location: [facility];  Service: Orthopedics;  Laterality: Right;   HIP PINNING,CANNULATED Right 11/07/2021   Procedure: RIGHT HIP PERCUTANEOUS SCREW FIXATION;  Surgeon: Genelle Standing, MD;  Location: [facility];  Service:  Orthopedics;  Laterality: Right;   OPERATIVE  ULTRASOUND N/A 07/27/2024   Procedure: US  INTRAOPERATIVE;  Surgeon: Viktoria Comer SAUNDERS, MD;  Location: [facility];  Service: Gynecology;  Laterality: N/A;   Right leg fracture s/p repair with 2 pins placed     TUBAL LIGATION      Family History  Problem Relation Age of Onset   Heart disease Mother    Lymphoma Sister    Leukemia Son    Bladder Cancer Neg Hx    Renal cancer Neg Hx    Uterine cancer Neg Hx    Breast cancer Neg Hx    Ovarian cancer Neg Hx    Colon cancer Neg Hx    Pancreatic cancer Neg Hx     Social History   Socioeconomic History   Marital status: Widowed    Spouse name: Not on file   Number of children: Not on file   Years of education: Not on file   Highest education level: Not on file  Occupational History   Not on file  Tobacco Use   Smoking status: Former    Types: Cigarettes   Smokeless tobacco: Never  Vaping Use   Vaping status: Never Used  Substance and Sexual Activity   Alcohol use: Yes    Comment: Rare   Drug use: Never   Sexual activity: Not Currently    Partners: Male  Other Topics Concern   Not on file  Social History Narrative   Not on file   Social Drivers of Health   Tobacco Use: Medium Risk (09/07/2024)   Patient History    Smoking Tobacco Use: Former    Smokeless Tobacco Use: Never    Passive Exposure: Not on Actuary Strain: Not on file  Food Insecurity: No Food Insecurity (07/14/2024)   Epic    Worried About Programme Researcher, Broadcasting/film/video in the Last Year: Never true    Ran Out of Food in the Last Year: Never true  Transportation Needs: No Transportation Needs (07/14/2024)   Epic    Lack of Transportation (Medical): No    Lack of Transportation (Non-Medical): No  Physical Activity: Not on file  Stress: Not on file  Social Connections: Moderately Integrated (02/01/2024)   Social Connection and Isolation Panel    Frequency of Communication with Friends and Family: More than three times a week    Frequency of Social  Gatherings with Friends and Family: More than three times a week    Attends Religious Services: More than 4 times per year    Active Member of [employer] or Organizations: No    Attends Banker Meetings: 1 to 4 times per year    Marital Status: Widowed  Depression (PHQ2-9): Low Risk (07/14/2024)   Depression (PHQ2-9)    PHQ-2 Score: 0  Alcohol Screen: Not on file  Housing: Low Risk (07/14/2024)   Epic    Unable to Pay for Housing in the Last Year: No    Number of Times Moved in the Last Year: 0    Homeless in the Last Year: No  Utilities: Not At Risk (07/14/2024)   Epic    Threatened with loss of utilities: No  Health Literacy: Not on file    Current Medications: Current Medications[1]  Review of Symptoms: Pertinent positives as per HPI.  Physical Exam: Deferred given limitations of phone visit.  Laboratory & Radiologic Studies: 08/22/24: Pelvic ultrasound 1. Simple appearing endometrial fluid collection status post hysteroscopy with dilatation and  curettage on 07/27/2024. 2. Moderate pelvic free fluid.  Assessment & Plan: Shelby Chan is a 89 y.o. woman with pelvic organ prolapse who was found to have intrauterine, now status post dilation under ultrasound guidance, manage of intrauterine fluid, hysteroscopy. Cytology from fluid showed no malignant cells.   Patient is overall well.  Started treatment yesterday for urinary tract infection.  Symptoms related to her fluid-filled uterus have improved, including her back pain and her urinary symptoms such as incontinence.  I discussed ultrasound findings with the patient's daughter.  Fluid collection within the uterus is significantly smaller than it was on the MRI.  I suspect that the fluid seen in her pelvis is related to fluid that was pushed out through the fallopian tubes at the time of her surgery.  We discussed again my inability to safely biopsy the endometrium during her recent procedure.  I am reassured by  the fact that there were no malignant cells in the cytology from her fluid.  I offered the option of getting the patient set up again for a D&C and hysteroscopy to sample the endometrium or planning to repeat imaging in 3 or 4 months.  The patient's daughter voices understanding that not proceeding with attempt at biopsy has a risk that we missed diagnosing an endometrial cancer.  Given her age and comorbidities as well as the fact that the little I could see of the wall looked very atrophic, I would favor close imaging follow-up.  Patient's daughter is in agreement with this and has previously discussed this with the patient who prefers to avoid surgery.  Order was placed for ultrasound to be repeated in April or May.  The patient has follow-up with Dr. Marilynne in March.  I discussed the assessment and treatment plan with the patient. The patient was provided with an opportunity to ask questions and all were answered. The patient agreed with the plan and demonstrated an understanding of the instructions.   The patient was advised to call back or see an in-person evaluation if the symptoms worsen or if the condition fails to improve as anticipated.   11 minutes of total time was spent for this patient encounter, including preparation, phone counseling with the patient and coordination of care, and documentation of the encounter.   Comer Dollar, MD  Division of Gynecologic Oncology  Department of Obstetrics and Gynecology  [facility]      [1]  Current Outpatient Medications:    acetaminophen  (TYLENOL ) 650 MG CR tablet, Take 1,300 mg by mouth every 8 (eight) hours as needed for pain., Disp: , Rfl:    aspirin  EC 81 MG tablet, Take 1 tablet (81 mg total) by mouth 2 (two) times daily. Continue taking once a day after a month, Disp: 60 tablet, Rfl: 0   Calcium Carbonate-Vitamin D  (CALTRATE 600+D PO), Take 1 tablet by mouth daily., Disp: , Rfl:    cetirizine  (ZYRTEC) 10 MG tablet, Take 10 mg by mouth daily., Disp: , Rfl:    Coenzyme Q10 100 MG capsule, Take 100 mg by mouth daily., Disp: , Rfl:    estradiol  (ESTRING ) 7.5 MCG/24HR vaginal ring, Place 1 each vaginally every 3 (three) months., Disp: 1 each, Rfl: 3   fosfomycin  (MONUROL ) 3 g PACK, Take 3 g by mouth once. Takes every 10 days, Disp: , Rfl:    Multiple Vitamins-Minerals (CENTRUM SILVER WOMEN 50+) TABS, Take 1 tablet by mouth daily with breakfast., Disp: , Rfl:    sulfamethoxazole -trimethoprim  (  BACTRIM  DS) 800-160 MG tablet, Take 1 tablet by mouth 2 (two) times daily for 3 days., Disp: 6 tablet, Rfl: 0   Trospium  Chloride 60 MG CP24, Take 1 capsule (60 mg total) by mouth daily., Disp: 30 capsule, Rfl: 5

## 2024-09-12 ENCOUNTER — Telehealth: Payer: Self-pay

## 2024-09-12 ENCOUNTER — Ambulatory Visit: Payer: Self-pay | Admitting: Obstetrics and Gynecology

## 2024-09-12 ENCOUNTER — Other Ambulatory Visit: Payer: Self-pay | Admitting: *Deleted

## 2024-09-12 DIAGNOSIS — N390 Urinary tract infection, site not specified: Secondary | ICD-10-CM

## 2024-09-12 NOTE — Telephone Encounter (Signed)
 Spoke with daughter and got date for US .. scheduled for 5/4 at 4:30pm left message on daughter voicemail .SABRA

## 2024-09-20 ENCOUNTER — Encounter: Payer: Self-pay | Admitting: Obstetrics and Gynecology

## 2024-09-20 ENCOUNTER — Telehealth: Payer: Self-pay

## 2024-09-20 DIAGNOSIS — N390 Urinary tract infection, site not specified: Secondary | ICD-10-CM

## 2024-09-20 MED ORDER — FOSFOMYCIN TROMETHAMINE 3 G PO PACK: 3.0000 g | PACK | ORAL | 1 refills | Status: AC

## 2024-09-20 NOTE — Telephone Encounter (Signed)
 Is patient to continue treatment with fosfomycin , she takes 1 pack every 10 days. Last does was 09-05-2024. If she needs to continue they will need additional refills. Thank you

## 2024-09-20 NOTE — Telephone Encounter (Signed)
 Patients daughter called wanting to know if patient should refill fosfomycin  (MONUROL ) 3 g PACK   States the provider did a a test to she if she was resistant to the fosfomycin  (MONUROL ) 3 g PACK .  Please advise

## 2024-11-07 ENCOUNTER — Ambulatory Visit: Admitting: Obstetrics and Gynecology

## 2025-01-02 ENCOUNTER — Ambulatory Visit (HOSPITAL_COMMUNITY)
